# Patient Record
Sex: Female | Born: 1940 | Race: White | Hispanic: No | Marital: Married | Smoking: Never smoker
Health system: Southern US, Community
[De-identification: ages and names within clinical notes are randomized; demographics above are authoritative.]

## PROBLEM LIST (undated history)

## (undated) DIAGNOSIS — J449 Chronic obstructive pulmonary disease, unspecified: Secondary | ICD-10-CM

## (undated) DIAGNOSIS — G4700 Insomnia, unspecified: Secondary | ICD-10-CM

## (undated) DIAGNOSIS — K219 Gastro-esophageal reflux disease without esophagitis: Secondary | ICD-10-CM

## (undated) DIAGNOSIS — G43909 Migraine, unspecified, not intractable, without status migrainosus: Secondary | ICD-10-CM

## (undated) DIAGNOSIS — M419 Scoliosis, unspecified: Secondary | ICD-10-CM

## (undated) DIAGNOSIS — R5382 Chronic fatigue, unspecified: Secondary | ICD-10-CM

## (undated) DIAGNOSIS — M797 Fibromyalgia: Secondary | ICD-10-CM

## (undated) DIAGNOSIS — M1990 Unspecified osteoarthritis, unspecified site: Secondary | ICD-10-CM

## (undated) DIAGNOSIS — M48061 Spinal stenosis, lumbar region without neurogenic claudication: Secondary | ICD-10-CM

## (undated) DIAGNOSIS — M810 Age-related osteoporosis without current pathological fracture: Secondary | ICD-10-CM

## (undated) DIAGNOSIS — J301 Allergic rhinitis due to pollen: Secondary | ICD-10-CM

## (undated) DIAGNOSIS — R030 Elevated blood-pressure reading, without diagnosis of hypertension: Secondary | ICD-10-CM

## (undated) DIAGNOSIS — M5416 Radiculopathy, lumbar region: Secondary | ICD-10-CM

## (undated) DIAGNOSIS — IMO0002 Reserved for concepts with insufficient information to code with codable children: Secondary | ICD-10-CM

## (undated) DIAGNOSIS — E039 Hypothyroidism, unspecified: Secondary | ICD-10-CM

## (undated) DIAGNOSIS — M533 Sacrococcygeal disorders, not elsewhere classified: Secondary | ICD-10-CM

## (undated) HISTORY — PX: RECTOCELE REPAIR: SHX761

## (undated) HISTORY — DX: Radiculopathy, lumbar region: M54.16

## (undated) HISTORY — DX: Reserved for concepts with insufficient information to code with codable children: IMO0002

## (undated) HISTORY — DX: Allergic rhinitis due to pollen: J30.1

## (undated) HISTORY — DX: Elevated blood-pressure reading, without diagnosis of hypertension: R03.0

## (undated) HISTORY — DX: Age-related osteoporosis without current pathological fracture: M81.0

## (undated) HISTORY — DX: Radiculopathy, lumbar region: M48.061

## (undated) HISTORY — DX: Hypothyroidism, unspecified: E03.9

## (undated) HISTORY — DX: Scoliosis, unspecified: M41.9

## (undated) HISTORY — PX: CATARACT EXTRACTION, BILATERAL: SHX1313

## (undated) HISTORY — DX: Chronic fatigue, unspecified: R53.82

## (undated) HISTORY — DX: Gastro-esophageal reflux disease without esophagitis: K21.9

## (undated) HISTORY — DX: Fibromyalgia: M79.7

## (undated) HISTORY — DX: Insomnia, unspecified: G47.00

## (undated) HISTORY — DX: Migraine, unspecified, not intractable, without status migrainosus: G43.909

## (undated) HISTORY — PX: OTHER SURGICAL HISTORY: SHX169

## (undated) HISTORY — PX: TOTAL KNEE ARTHROPLASTY: SHX125

## (undated) HISTORY — PX: ESOPHAGOGASTRODUODENOSCOPY ENDOSCOPY: SHX5814

## (undated) HISTORY — DX: Sacrococcygeal disorders, not elsewhere classified: M53.3

## (undated) HISTORY — DX: Unspecified osteoarthritis, unspecified site: M19.90

## (undated) HISTORY — DX: Chronic obstructive pulmonary disease, unspecified: J44.9

---

## 1981-05-17 HISTORY — PX: BLADDER SUSPENSION: SHX72

## 1981-05-17 HISTORY — PX: VAGINAL HYSTERECTOMY: SUR661

## 1998-05-21 ENCOUNTER — Ambulatory Visit (HOSPITAL_COMMUNITY): Admission: RE | Admit: 1998-05-21 | Discharge: 1998-05-21 | Payer: Self-pay | Admitting: Plastic Surgery

## 1999-08-24 ENCOUNTER — Other Ambulatory Visit: Admission: RE | Admit: 1999-08-24 | Discharge: 1999-08-24 | Payer: Self-pay | Admitting: Internal Medicine

## 2000-02-07 ENCOUNTER — Encounter: Payer: Self-pay | Admitting: Neurosurgery

## 2000-02-07 ENCOUNTER — Ambulatory Visit (HOSPITAL_COMMUNITY): Admission: RE | Admit: 2000-02-07 | Discharge: 2000-02-07 | Payer: Self-pay | Admitting: Neurosurgery

## 2000-02-28 ENCOUNTER — Ambulatory Visit (HOSPITAL_COMMUNITY): Admission: RE | Admit: 2000-02-28 | Discharge: 2000-02-28 | Payer: Self-pay | Admitting: Internal Medicine

## 2000-02-28 ENCOUNTER — Encounter: Payer: Self-pay | Admitting: Internal Medicine

## 2000-05-12 ENCOUNTER — Encounter: Payer: Self-pay | Admitting: Internal Medicine

## 2000-07-09 ENCOUNTER — Encounter: Payer: Self-pay | Admitting: Internal Medicine

## 2001-11-11 ENCOUNTER — Encounter: Payer: Self-pay | Admitting: Neurosurgery

## 2001-11-11 ENCOUNTER — Ambulatory Visit (HOSPITAL_COMMUNITY): Admission: RE | Admit: 2001-11-11 | Discharge: 2001-11-11 | Payer: Self-pay | Admitting: Neurosurgery

## 2002-10-22 ENCOUNTER — Encounter: Payer: Self-pay | Admitting: Orthopaedic Surgery

## 2002-10-22 ENCOUNTER — Encounter: Admission: RE | Admit: 2002-10-22 | Discharge: 2002-10-22 | Payer: Self-pay | Admitting: Orthopaedic Surgery

## 2002-10-22 ENCOUNTER — Encounter: Payer: Self-pay | Admitting: Radiology

## 2002-11-30 ENCOUNTER — Encounter: Admission: RE | Admit: 2002-11-30 | Discharge: 2002-11-30 | Payer: Self-pay | Admitting: Orthopedic Surgery

## 2002-11-30 ENCOUNTER — Encounter: Payer: Self-pay | Admitting: Orthopedic Surgery

## 2002-12-17 ENCOUNTER — Encounter: Payer: Self-pay | Admitting: Orthopedic Surgery

## 2002-12-17 ENCOUNTER — Encounter: Admission: RE | Admit: 2002-12-17 | Discharge: 2002-12-17 | Payer: Self-pay | Admitting: Orthopedic Surgery

## 2004-01-27 HISTORY — PX: COLONOSCOPY: SHX174

## 2004-04-21 ENCOUNTER — Ambulatory Visit: Payer: Self-pay | Admitting: Internal Medicine

## 2004-04-24 ENCOUNTER — Ambulatory Visit: Payer: Self-pay | Admitting: Internal Medicine

## 2004-06-15 ENCOUNTER — Ambulatory Visit: Payer: Self-pay | Admitting: Internal Medicine

## 2004-06-30 ENCOUNTER — Ambulatory Visit: Payer: Self-pay | Admitting: Internal Medicine

## 2004-08-25 ENCOUNTER — Ambulatory Visit: Payer: Self-pay | Admitting: Internal Medicine

## 2004-09-22 ENCOUNTER — Ambulatory Visit: Payer: Self-pay | Admitting: Internal Medicine

## 2004-11-24 ENCOUNTER — Ambulatory Visit: Payer: Self-pay | Admitting: Internal Medicine

## 2004-12-08 ENCOUNTER — Ambulatory Visit: Payer: Self-pay | Admitting: Internal Medicine

## 2005-02-15 ENCOUNTER — Ambulatory Visit: Payer: Self-pay | Admitting: Internal Medicine

## 2005-04-22 ENCOUNTER — Ambulatory Visit: Payer: Self-pay | Admitting: Internal Medicine

## 2005-06-29 ENCOUNTER — Ambulatory Visit: Payer: Self-pay | Admitting: Internal Medicine

## 2005-08-17 ENCOUNTER — Ambulatory Visit: Payer: Self-pay | Admitting: Internal Medicine

## 2005-10-19 ENCOUNTER — Ambulatory Visit: Payer: Self-pay | Admitting: Internal Medicine

## 2005-12-14 ENCOUNTER — Ambulatory Visit: Payer: Self-pay | Admitting: Internal Medicine

## 2006-01-24 ENCOUNTER — Ambulatory Visit: Payer: Self-pay | Admitting: Internal Medicine

## 2006-03-29 ENCOUNTER — Ambulatory Visit: Payer: Self-pay | Admitting: Internal Medicine

## 2006-06-28 ENCOUNTER — Ambulatory Visit: Payer: Self-pay | Admitting: Internal Medicine

## 2006-08-29 ENCOUNTER — Ambulatory Visit: Payer: Self-pay | Admitting: Internal Medicine

## 2006-09-05 ENCOUNTER — Encounter: Admission: RE | Admit: 2006-09-05 | Discharge: 2006-09-05 | Payer: Self-pay | Admitting: Neurosurgery

## 2006-11-29 ENCOUNTER — Ambulatory Visit: Payer: Self-pay | Admitting: Internal Medicine

## 2006-11-30 DIAGNOSIS — J301 Allergic rhinitis due to pollen: Secondary | ICD-10-CM

## 2006-11-30 DIAGNOSIS — B372 Candidiasis of skin and nail: Secondary | ICD-10-CM

## 2006-11-30 DIAGNOSIS — M797 Fibromyalgia: Secondary | ICD-10-CM

## 2006-11-30 DIAGNOSIS — E039 Hypothyroidism, unspecified: Secondary | ICD-10-CM

## 2006-11-30 DIAGNOSIS — K219 Gastro-esophageal reflux disease without esophagitis: Secondary | ICD-10-CM | POA: Insufficient documentation

## 2006-11-30 HISTORY — DX: Allergic rhinitis due to pollen: J30.1

## 2007-02-03 ENCOUNTER — Ambulatory Visit: Payer: Self-pay | Admitting: Internal Medicine

## 2007-02-07 ENCOUNTER — Telehealth (INDEPENDENT_AMBULATORY_CARE_PROVIDER_SITE_OTHER): Payer: Self-pay | Admitting: *Deleted

## 2007-02-16 ENCOUNTER — Telehealth: Payer: Self-pay | Admitting: *Deleted

## 2007-02-16 DIAGNOSIS — M159 Polyosteoarthritis, unspecified: Secondary | ICD-10-CM | POA: Insufficient documentation

## 2007-02-28 ENCOUNTER — Inpatient Hospital Stay (HOSPITAL_COMMUNITY): Admission: RE | Admit: 2007-02-28 | Discharge: 2007-03-02 | Payer: Self-pay | Admitting: Orthopedic Surgery

## 2007-03-24 ENCOUNTER — Ambulatory Visit: Payer: Self-pay | Admitting: Internal Medicine

## 2007-03-24 DIAGNOSIS — L723 Sebaceous cyst: Secondary | ICD-10-CM | POA: Insufficient documentation

## 2007-04-28 ENCOUNTER — Ambulatory Visit: Payer: Self-pay | Admitting: Internal Medicine

## 2007-04-28 DIAGNOSIS — E539 Vitamin B deficiency, unspecified: Secondary | ICD-10-CM | POA: Insufficient documentation

## 2007-05-02 ENCOUNTER — Telehealth: Payer: Self-pay | Admitting: Internal Medicine

## 2007-05-18 HISTORY — PX: CHOLECYSTECTOMY: SHX55

## 2007-05-29 ENCOUNTER — Telehealth: Payer: Self-pay | Admitting: Internal Medicine

## 2007-05-30 ENCOUNTER — Telehealth: Payer: Self-pay | Admitting: Internal Medicine

## 2007-06-27 ENCOUNTER — Telehealth: Payer: Self-pay | Admitting: Internal Medicine

## 2007-07-14 ENCOUNTER — Ambulatory Visit: Payer: Self-pay | Admitting: Internal Medicine

## 2007-07-14 LAB — CONVERTED CEMR LAB: Vit D, 1,25-Dihydroxy: 63 (ref 30–89)

## 2007-07-25 ENCOUNTER — Encounter: Payer: Self-pay | Admitting: Internal Medicine

## 2007-08-03 ENCOUNTER — Encounter: Payer: Self-pay | Admitting: Internal Medicine

## 2007-08-28 ENCOUNTER — Encounter: Payer: Self-pay | Admitting: Internal Medicine

## 2007-08-30 ENCOUNTER — Telehealth: Payer: Self-pay | Admitting: *Deleted

## 2007-10-06 ENCOUNTER — Ambulatory Visit (HOSPITAL_BASED_OUTPATIENT_CLINIC_OR_DEPARTMENT_OTHER): Admission: RE | Admit: 2007-10-06 | Discharge: 2007-10-06 | Payer: Self-pay | Admitting: Urology

## 2007-10-12 ENCOUNTER — Ambulatory Visit: Payer: Self-pay | Admitting: Internal Medicine

## 2007-10-23 ENCOUNTER — Encounter: Payer: Self-pay | Admitting: Internal Medicine

## 2007-11-16 ENCOUNTER — Telehealth: Payer: Self-pay | Admitting: Internal Medicine

## 2007-12-12 ENCOUNTER — Ambulatory Visit: Payer: Self-pay | Admitting: Internal Medicine

## 2007-12-12 DIAGNOSIS — R350 Frequency of micturition: Secondary | ICD-10-CM

## 2007-12-12 DIAGNOSIS — R609 Edema, unspecified: Secondary | ICD-10-CM

## 2007-12-12 LAB — CONVERTED CEMR LAB
Chloride: 103 meq/L (ref 96–112)
Folate: >20 ng/mL
Free T4: 0.7 ng/dL (ref 0.6–1.6)
GFR calc Af Amer: 107 mL/min
GFR calc non Af Amer: 89 mL/min
Potassium: 3.7 meq/L (ref 3.5–5.1)
TSH: 1.18 microintl units/mL (ref 0.35–5.50)
Vitamin B-12: 623 pg/mL (ref 211–911)

## 2007-12-20 ENCOUNTER — Encounter: Payer: Self-pay | Admitting: Internal Medicine

## 2008-02-07 ENCOUNTER — Telehealth: Payer: Self-pay | Admitting: Internal Medicine

## 2008-02-07 ENCOUNTER — Ambulatory Visit: Payer: Self-pay | Admitting: Internal Medicine

## 2008-02-07 ENCOUNTER — Encounter: Admission: RE | Admit: 2008-02-07 | Discharge: 2008-02-07 | Payer: Self-pay | Admitting: Internal Medicine

## 2008-02-07 LAB — CONVERTED CEMR LAB
Alkaline Phosphatase: 64 units/L (ref 39–117)
Bilirubin, Direct: 0.1 mg/dL (ref 0.0–0.3)
Eosinophils Absolute: 0.3 10*3/uL (ref 0.0–0.7)
Eosinophils Relative: 6.5 % — ABNORMAL HIGH (ref 0.0–5.0)
HCT: 34.8 % — ABNORMAL LOW (ref 36.0–46.0)
MCV: 95.6 fL (ref 78.0–100.0)
Monocytes Absolute: 0.4 10*3/uL (ref 0.1–1.0)
Neutrophils Relative %: 43.8 % (ref 43.0–77.0)
Platelets: 223 10*3/uL (ref 150–400)
RDW: 12.8 % (ref 11.5–14.6)
Total Bilirubin: 0.5 mg/dL (ref 0.3–1.2)
Total Protein: 5.9 g/dL — ABNORMAL LOW (ref 6.0–8.3)
WBC: 4.7 10*3/uL (ref 4.5–10.5)

## 2008-02-13 ENCOUNTER — Ambulatory Visit (HOSPITAL_COMMUNITY): Admission: RE | Admit: 2008-02-13 | Discharge: 2008-02-13 | Payer: Self-pay | Admitting: Internal Medicine

## 2008-02-15 ENCOUNTER — Ambulatory Visit: Payer: Self-pay | Admitting: Internal Medicine

## 2008-02-20 ENCOUNTER — Telehealth: Payer: Self-pay | Admitting: Internal Medicine

## 2008-03-01 ENCOUNTER — Encounter: Payer: Self-pay | Admitting: Internal Medicine

## 2008-04-02 ENCOUNTER — Ambulatory Visit (HOSPITAL_COMMUNITY): Admission: RE | Admit: 2008-04-02 | Discharge: 2008-04-02 | Payer: Self-pay | Admitting: General Surgery

## 2008-04-02 ENCOUNTER — Encounter (INDEPENDENT_AMBULATORY_CARE_PROVIDER_SITE_OTHER): Payer: Self-pay | Admitting: General Surgery

## 2008-04-02 ENCOUNTER — Encounter: Payer: Self-pay | Admitting: Internal Medicine

## 2008-04-16 ENCOUNTER — Encounter: Payer: Self-pay | Admitting: Internal Medicine

## 2008-04-17 ENCOUNTER — Ambulatory Visit: Payer: Self-pay | Admitting: Internal Medicine

## 2008-04-17 DIAGNOSIS — R51 Headache: Secondary | ICD-10-CM

## 2008-04-17 DIAGNOSIS — R32 Unspecified urinary incontinence: Secondary | ICD-10-CM

## 2008-04-17 DIAGNOSIS — K5909 Other constipation: Secondary | ICD-10-CM

## 2008-04-17 DIAGNOSIS — R519 Headache, unspecified: Secondary | ICD-10-CM | POA: Insufficient documentation

## 2008-04-17 LAB — CONVERTED CEMR LAB
Basophils Absolute: 0 10*3/uL (ref 0.0–0.1)
Basophils Relative: 0 % (ref 0.0–3.0)
Eosinophils Absolute: 1 10*3/uL — ABNORMAL HIGH (ref 0.0–0.7)
HCT: 35 % — ABNORMAL LOW (ref 36.0–46.0)
Hemoglobin: 12 g/dL (ref 12.0–15.0)
Lymphocytes Relative: 34.2 % (ref 12.0–46.0)
MCHC: 34.2 g/dL (ref 30.0–36.0)
MCV: 98.4 fL (ref 78.0–100.0)
Monocytes Absolute: 0.4 10*3/uL (ref 0.1–1.0)
Neutro Abs: 2 10*3/uL (ref 1.4–7.7)
RBC: 3.56 M/uL — ABNORMAL LOW (ref 3.87–5.11)
RDW: 13.3 % (ref 11.5–14.6)
Vitamin B-12: 1219 pg/mL — ABNORMAL HIGH (ref 211–911)

## 2008-04-24 ENCOUNTER — Telehealth: Payer: Self-pay | Admitting: Internal Medicine

## 2008-06-17 ENCOUNTER — Ambulatory Visit: Payer: Self-pay | Admitting: Internal Medicine

## 2008-06-17 DIAGNOSIS — G909 Disorder of the autonomic nervous system, unspecified: Secondary | ICD-10-CM | POA: Insufficient documentation

## 2008-06-17 DIAGNOSIS — G990 Autonomic neuropathy in diseases classified elsewhere: Secondary | ICD-10-CM | POA: Insufficient documentation

## 2008-06-17 LAB — CONVERTED CEMR LAB: Folate: >20 ng/mL

## 2008-06-26 ENCOUNTER — Telehealth: Payer: Self-pay | Admitting: Internal Medicine

## 2008-06-28 ENCOUNTER — Telehealth (INDEPENDENT_AMBULATORY_CARE_PROVIDER_SITE_OTHER): Payer: Self-pay | Admitting: *Deleted

## 2008-07-15 ENCOUNTER — Ambulatory Visit: Payer: Self-pay | Admitting: Internal Medicine

## 2008-08-12 ENCOUNTER — Telehealth: Payer: Self-pay | Admitting: Internal Medicine

## 2008-08-23 ENCOUNTER — Ambulatory Visit: Payer: Self-pay | Admitting: Internal Medicine

## 2008-08-23 DIAGNOSIS — J329 Chronic sinusitis, unspecified: Secondary | ICD-10-CM | POA: Insufficient documentation

## 2008-08-26 ENCOUNTER — Encounter: Payer: Self-pay | Admitting: Internal Medicine

## 2008-08-26 ENCOUNTER — Ambulatory Visit: Payer: Self-pay | Admitting: Internal Medicine

## 2008-08-27 ENCOUNTER — Encounter: Payer: Self-pay | Admitting: Internal Medicine

## 2008-09-02 ENCOUNTER — Telehealth: Payer: Self-pay | Admitting: Internal Medicine

## 2008-09-06 ENCOUNTER — Encounter: Admission: RE | Admit: 2008-09-06 | Discharge: 2008-09-06 | Payer: Self-pay | Admitting: Orthopedic Surgery

## 2008-09-16 ENCOUNTER — Ambulatory Visit: Payer: Self-pay | Admitting: Internal Medicine

## 2008-09-17 ENCOUNTER — Telehealth: Payer: Self-pay | Admitting: Internal Medicine

## 2008-09-17 LAB — CONVERTED CEMR LAB
Free T4: 1.3 ng/dL (ref 0.6–1.6)
T3, Free: 2.6 pg/mL (ref 2.3–4.2)

## 2008-10-21 ENCOUNTER — Telehealth: Payer: Self-pay | Admitting: Internal Medicine

## 2008-10-23 ENCOUNTER — Encounter: Admission: RE | Admit: 2008-10-23 | Discharge: 2008-10-23 | Payer: Self-pay | Admitting: Orthopedic Surgery

## 2008-10-31 ENCOUNTER — Telehealth: Payer: Self-pay | Admitting: Internal Medicine

## 2008-11-06 ENCOUNTER — Encounter: Admission: RE | Admit: 2008-11-06 | Discharge: 2008-11-06 | Payer: Self-pay | Admitting: Orthopedic Surgery

## 2008-12-04 ENCOUNTER — Ambulatory Visit: Payer: Self-pay | Admitting: Internal Medicine

## 2008-12-04 DIAGNOSIS — M810 Age-related osteoporosis without current pathological fracture: Secondary | ICD-10-CM | POA: Insufficient documentation

## 2008-12-06 ENCOUNTER — Telehealth: Payer: Self-pay | Admitting: Internal Medicine

## 2008-12-12 ENCOUNTER — Ambulatory Visit: Payer: Self-pay | Admitting: Internal Medicine

## 2008-12-12 ENCOUNTER — Encounter: Payer: Self-pay | Admitting: Internal Medicine

## 2008-12-13 ENCOUNTER — Telehealth: Payer: Self-pay | Admitting: Internal Medicine

## 2008-12-17 ENCOUNTER — Telehealth: Payer: Self-pay | Admitting: *Deleted

## 2008-12-19 ENCOUNTER — Encounter: Payer: Self-pay | Admitting: Internal Medicine

## 2008-12-30 ENCOUNTER — Telehealth (INDEPENDENT_AMBULATORY_CARE_PROVIDER_SITE_OTHER): Payer: Self-pay | Admitting: *Deleted

## 2009-01-14 LAB — CONVERTED CEMR LAB: Pap Smear: NORMAL

## 2009-02-14 ENCOUNTER — Telehealth: Payer: Self-pay | Admitting: Internal Medicine

## 2009-02-18 ENCOUNTER — Encounter: Payer: Self-pay | Admitting: Internal Medicine

## 2009-03-04 ENCOUNTER — Ambulatory Visit: Payer: Self-pay | Admitting: Internal Medicine

## 2009-03-04 LAB — CONVERTED CEMR LAB
CO2: 32 meq/L (ref 19–32)
Chloride: 101 meq/L (ref 96–112)
Creatinine, Ser: 0.7 mg/dL (ref 0.4–1.2)
Free T4: 0.9 ng/dL (ref 0.6–1.6)
Potassium: 3.8 meq/L (ref 3.5–5.1)
Sodium: 144 meq/L (ref 135–145)
T3, Free: 2.7 pg/mL (ref 2.3–4.2)
TSH: 0.39 microintl units/mL (ref 0.35–5.50)

## 2009-03-11 ENCOUNTER — Telehealth: Payer: Self-pay | Admitting: Internal Medicine

## 2009-05-17 HISTORY — PX: ROTATOR CUFF REPAIR: SHX139

## 2009-05-26 ENCOUNTER — Ambulatory Visit: Payer: Self-pay | Admitting: Internal Medicine

## 2009-05-30 ENCOUNTER — Encounter: Payer: Self-pay | Admitting: Internal Medicine

## 2009-06-03 ENCOUNTER — Inpatient Hospital Stay (HOSPITAL_COMMUNITY): Admission: RE | Admit: 2009-06-03 | Discharge: 2009-06-05 | Payer: Self-pay | Admitting: Orthopedic Surgery

## 2009-06-10 ENCOUNTER — Ambulatory Visit (HOSPITAL_COMMUNITY)
Admission: RE | Admit: 2009-06-10 | Discharge: 2009-06-10 | Payer: Self-pay | Source: Home / Self Care | Admitting: Orthopedic Surgery

## 2009-06-10 ENCOUNTER — Ambulatory Visit: Payer: Self-pay | Admitting: Vascular Surgery

## 2009-08-27 ENCOUNTER — Ambulatory Visit: Payer: Self-pay | Admitting: Internal Medicine

## 2009-08-27 LAB — CONVERTED CEMR LAB
BUN: 21 mg/dL (ref 6–23)
Basophils Relative: 0.5 % (ref 0.0–3.0)
Bilirubin Urine: NEGATIVE
CO2: 29 meq/L (ref 19–32)
Chloride: 103 meq/L (ref 96–112)
Creatinine, Ser: 0.6 mg/dL (ref 0.4–1.2)
Free T4: 0.9 ng/dL (ref 0.6–1.6)
Glucose, Bld: 102 mg/dL — ABNORMAL HIGH (ref 70–99)
Glucose, Urine, Semiquant: NEGATIVE
HCT: 38.8 % (ref 36.0–46.0)
Hemoglobin: 13.6 g/dL (ref 12.0–15.0)
Lymphocytes Relative: 18.1 % (ref 12.0–46.0)
Lymphs Abs: 1 10*3/uL (ref 0.7–4.0)
Monocytes Relative: 7.5 % (ref 3.0–12.0)
Neutro Abs: 4.2 10*3/uL (ref 1.4–7.7)
RBC: 3.96 M/uL (ref 3.87–5.11)
T3, Free: 2.4 pg/mL (ref 2.3–4.2)
pH: 5

## 2009-09-10 ENCOUNTER — Telehealth: Payer: Self-pay | Admitting: Internal Medicine

## 2009-09-18 ENCOUNTER — Telehealth: Payer: Self-pay | Admitting: Internal Medicine

## 2009-10-08 ENCOUNTER — Telehealth: Payer: Self-pay | Admitting: Internal Medicine

## 2009-10-15 ENCOUNTER — Ambulatory Visit: Payer: Self-pay | Admitting: Internal Medicine

## 2009-10-15 ENCOUNTER — Telehealth: Payer: Self-pay | Admitting: Internal Medicine

## 2009-10-16 ENCOUNTER — Encounter: Payer: Self-pay | Admitting: Internal Medicine

## 2009-10-16 ENCOUNTER — Telehealth: Payer: Self-pay | Admitting: Internal Medicine

## 2009-10-17 ENCOUNTER — Ambulatory Visit: Payer: Self-pay | Admitting: Internal Medicine

## 2009-10-21 ENCOUNTER — Ambulatory Visit: Payer: Self-pay | Admitting: Internal Medicine

## 2009-10-24 ENCOUNTER — Ambulatory Visit: Payer: Self-pay | Admitting: Internal Medicine

## 2009-10-28 ENCOUNTER — Ambulatory Visit: Payer: Self-pay | Admitting: Internal Medicine

## 2009-10-30 ENCOUNTER — Telehealth (INDEPENDENT_AMBULATORY_CARE_PROVIDER_SITE_OTHER): Payer: Self-pay | Admitting: *Deleted

## 2009-10-31 ENCOUNTER — Ambulatory Visit: Payer: Self-pay | Admitting: Internal Medicine

## 2009-11-04 ENCOUNTER — Ambulatory Visit: Payer: Self-pay | Admitting: Internal Medicine

## 2009-11-07 ENCOUNTER — Ambulatory Visit: Payer: Self-pay | Admitting: Internal Medicine

## 2009-11-07 ENCOUNTER — Telehealth (INDEPENDENT_AMBULATORY_CARE_PROVIDER_SITE_OTHER): Payer: Self-pay | Admitting: *Deleted

## 2009-11-11 ENCOUNTER — Ambulatory Visit: Payer: Self-pay | Admitting: Internal Medicine

## 2009-11-11 ENCOUNTER — Encounter: Payer: Self-pay | Admitting: Internal Medicine

## 2009-11-24 ENCOUNTER — Ambulatory Visit: Payer: Self-pay | Admitting: Internal Medicine

## 2009-11-25 ENCOUNTER — Ambulatory Visit: Payer: Self-pay | Admitting: Internal Medicine

## 2009-12-15 ENCOUNTER — Ambulatory Visit: Payer: Self-pay | Admitting: Internal Medicine

## 2009-12-23 ENCOUNTER — Ambulatory Visit: Payer: Self-pay | Admitting: Internal Medicine

## 2010-01-22 ENCOUNTER — Ambulatory Visit: Payer: Self-pay | Admitting: Family Medicine

## 2010-01-23 ENCOUNTER — Telehealth (INDEPENDENT_AMBULATORY_CARE_PROVIDER_SITE_OTHER): Payer: Self-pay | Admitting: *Deleted

## 2010-02-12 ENCOUNTER — Ambulatory Visit: Payer: Self-pay | Admitting: Internal Medicine

## 2010-02-12 LAB — CONVERTED CEMR LAB
BUN: 21 mg/dL (ref 6–23)
Bilirubin Urine: NEGATIVE
Chloride: 108 meq/L (ref 96–112)
Cholesterol: 268 mg/dL — ABNORMAL HIGH (ref 0–200)
Creatinine, Ser: 0.6 mg/dL (ref 0.4–1.2)
Direct LDL: 148.7 mg/dL
HDL: 103 mg/dL (ref >39.00)
Iron: 121 ug/dL (ref 42–145)
Ketones, urine, test strip: NEGATIVE
Protein, U semiquant: NEGATIVE
Triglycerides: 126 mg/dL (ref 0.0–149.0)
Urobilinogen, UA: 0.2
VLDL: 25.2 mg/dL (ref 0.0–40.0)

## 2010-03-16 ENCOUNTER — Ambulatory Visit: Payer: Self-pay | Admitting: Internal Medicine

## 2010-03-24 ENCOUNTER — Ambulatory Visit: Payer: Self-pay | Admitting: Internal Medicine

## 2010-04-27 ENCOUNTER — Encounter (INDEPENDENT_AMBULATORY_CARE_PROVIDER_SITE_OTHER): Payer: Self-pay | Admitting: *Deleted

## 2010-05-12 ENCOUNTER — Ambulatory Visit: Payer: Self-pay | Admitting: Internal Medicine

## 2010-05-27 ENCOUNTER — Ambulatory Visit: Admit: 2010-05-27 | Payer: Self-pay | Admitting: Internal Medicine

## 2010-06-08 ENCOUNTER — Ambulatory Visit
Admission: RE | Admit: 2010-06-08 | Discharge: 2010-06-08 | Payer: Self-pay | Source: Home / Self Care | Attending: Internal Medicine | Admitting: Internal Medicine

## 2010-06-08 DIAGNOSIS — M5412 Radiculopathy, cervical region: Secondary | ICD-10-CM | POA: Insufficient documentation

## 2010-06-16 NOTE — Assessment & Plan Note (Signed)
Summary: allergy test/klw   Vital Signs:  Patient profile:   70 year old female Height:      63 inches Weight:      146 pounds BMI:     25.96 O2 Sat:      96 % on Room air Pulse rate:   86 / minute BP sitting:   114 / 80  (left arm) Cuff size:   regular  Vitals Entered By: Randell Loop CMA (October 15, 2009 3:49 PM)  O2 Sat at Rest %:  96 O2 Flow:  Room air Is Patient Diabetic? No Pain Assessment Patient in pain? no      Comments meds updated today with pt--no antihistamine since 10-11-2009   Copy to:  Darryll Capers Primary Provider/Referring Provider:  Darryll Capers   History of Present Illness: History of Present Illness: 08/23/08- Allergy consult courtesy of Dr Lovell Sheehan for this 67YOF. She had systemic complaints in the 1980's, dx'd as "allergy" by an alternative practioner who used sublingual testing. She continued that approach until she moved from [place] to [place], where she was skin tested  and treated with allergy vaccine. She says that approach also helped. she came to this area 20 yrs ago. After a few years here she was skin tested and placed on allergy vaccine by Dr Suwanee Callas. that seemed to help her for several years, and during that time she was giving her own allergy vaccine, saying it was too far to drive to office. She dropped off vaccine for 6-7 years and gradually feels worse again. Complaints include periorbital dark shiners and edema, stuffy head, sneezing, itching, nasal congestion with snoring that bothers her husband. Never had asthma. Palate itches. Nasal discharge clear, yellow or little blood. Nasonex helps rhinorhea. Astelin increased congestion. Uses AFrin. Triggers said to include strong odors but not seasonal pollens, foods, insect stings or cosmetics, contrast dye or aspirin. Latex causes contact rash. labs: 06/17/08- IgE total low- 1.6.. Immunocap/RAST negative panel. CBC- EOS 18.9%.  October 15, 2009- Rhinitis Return delayed due to several surgeries since  last here. No respiratory complication. This Spring had "difficult" nasal congestion - perennial but worse in Spring. Denies sneeze, itch, drainage. Worse if outdoors much. Not sensitive to temperature changes.  Daily Afrin once. Using nasonex. Denies benefit from anthistamines.  She recalls doing well after allergy vaccine from Dr Waldron Callas 10 years ago,  but flared as per initial HPI. IgE 6.7 Allergy IgE specific panel -not elevated CBC-EOS 3.1% ST- modest reactions, but numerous, to grass, trees, dust mite, mold.     Preventive Screening-Counseling & Management  Alcohol-Tobacco     Smoking Status: never  Medications Prior to Update: 1)  Levothroid 150 Mcg Tabs (Levothyroxine Sodium) .Marland Kitchen.. 1 Once Daily 2)  Prednisone 1 Mg  Tabs (Prednisone) .... 7 A Day 3)  Piroxicam 20 Mg Caps (Piroxicam) .... Once Daily 4)  Hydrocodone-Acetaminophen 10-500 Mg Tabs (Hydrocodone-Acetaminophen) .... One By Mouth Two Times A Day As Needed (  This Overrides Prior Rx) 5)  Vitamin D 16109 Unit  Caps (Ergocalciferol) .... One By Mouth Weekly 6)  Cyanocobalamin 1000 Mcg/ml Inj Soln (Cyanocobalamin) .Marland Kitchen.. 1 Ml Q 6 Weeks 7)  Ambien 10 Mg  Tabs (Zolpidem Tartrate) .... Before Bedtime As Needed 8)  Ocuvite Adult Formula   Caps (Multiple Vitamins-Minerals) .... Once Daily 9)  Correctol 5 Mg  Tbec (Bisacodyl) .Marland Kitchen.. 1 At Bedtime 10)  Anacin 400-32 Mg  Tabs (Aspirin-Caffeine) .... As Needed 11)  Folic Acid 1 Mg  Tabs (Folic Acid) .... 1/2 Once Daily 12)  Clotrimazole-Betamethasone 1-0.05 % Lotn (Clotrimazole-Betamethasone) .... 2-4 Drops in Ear Two Times A Day As Needed 13)  Multivitamins  Caps (Multiple Vitamin) .Marland Kitchen.. 1 Once Daily 14)  Sumatriptan Succinate 50 Mg Tabs (Sumatriptan Succinate) .... One By Mouth As Needed Head Ache 15)  Nasonex 50 Mcg/act Susp (Mometasone Furoate) .... 2 Sprays Each Nostril Once Daily 16)  Provigil 200 Mg Tabs (Modafinil) .Marland Kitchen.. 1 Once Daily 17)  Ciprofloxacin Hcl 250 Mg Tabs (Ciprofloxacin  Hcl) .... One By Mouth Two Times A Day For 7 Dyas 18)  Ciprofloxacin Hcl 250 Mg Tabs (Ciprofloxacin Hcl) .... One By Mouth Two Times A Day X 5 Days  Current Medications (verified): 1)  Levothroid 150 Mcg Tabs (Levothyroxine Sodium) .Marland Kitchen.. 1 Once Daily 2)  Prednisone 1 Mg  Tabs (Prednisone) .... 5 Tablets By Mouth  A Day 3)  Piroxicam 20 Mg Caps (Piroxicam) .... Once Daily 4)  Hydrocodone-Acetaminophen 10-500 Mg Tabs (Hydrocodone-Acetaminophen) .... One By Mouth Two Times A Day As Needed (  This Overrides Prior Rx) 5)  Vitamin D 62130 Unit  Caps (Ergocalciferol) .... One By Mouth Weekly 6)  Cyanocobalamin 1000 Mcg/ml Inj Soln (Cyanocobalamin) .Marland Kitchen.. 1 Ml Q 6 Weeks 7)  Ambien 10 Mg  Tabs (Zolpidem Tartrate) .... Before Bedtime As Needed 8)  Ocuvite Adult Formula   Caps (Multiple Vitamins-Minerals) .... Once Daily 9)  Correctol 5 Mg  Tbec (Bisacodyl) .Marland Kitchen.. 1 At Bedtime 10)  Anacin 400-32 Mg  Tabs (Aspirin-Caffeine) .... As Needed 11)  Folic Acid 1 Mg  Tabs (Folic Acid) .... 1/2 Once Daily 12)  Clotrimazole-Betamethasone 1-0.05 % Lotn (Clotrimazole-Betamethasone) .... 2-4 Drops in Ear Two Times A Day As Needed 13)  Multivitamins  Caps (Multiple Vitamin) .Marland Kitchen.. 1 Once Daily 14)  Sumatriptan Succinate 50 Mg Tabs (Sumatriptan Succinate) .... One By Mouth As Needed Head Ache 15)  Nasonex 50 Mcg/act Susp (Mometasone Furoate) .... 2 Sprays Each Nostril Once Daily  Allergies: 1)  ! * Lamisil  Comments:  Nurse/Medical Assistant: The patient's medications and allergies were reviewed with the patient and were updated in the Medication and Allergy Lists.  Past History:  Past Medical History: Last updated: 04/17/2008 Allergic rhinitis GERD Hypothyroidism Fibromyalgia Headache Urinary incontinence  Family History: Last updated: 08/23/2008 Family History Other cancer-Colon Fam hx Irritable bowel dementia grandchild died  Social History: Last updated: 08/23/2008 Retired Married- feels life  stress Patient never smoked.   Risk Factors: Exercise: no (04/28/2007)  Risk Factors: Smoking Status: never (10/15/2009) Passive Smoke Exposure: no (08/27/2009)  Past Surgical History: TAH-70yo Colonoscopy-01/27/2004 Total knee replacement-right 2008, Left 2011 Rectocoel Knee arthroscopy Right rotator cuff  Review of Systems      See HPI       The patient complains of nasal congestion/difficulty breathing through nose.  The patient denies shortness of breath with activity, shortness of breath at rest, productive cough, non-productive cough, coughing up blood, chest pain, irregular heartbeats, acid heartburn, indigestion, loss of appetite, weight change, abdominal pain, difficulty swallowing, sore throat, tooth/dental problems, headaches, and sneezing.    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 She wants to restart allergy vaccine Husband home. 45 minute drive. Discussed risk/ policy, protocol, anaphyllaxis, epipen. Consider neti pot. CT sinus was NEG, but chronic sinusitis is not excluded.  Problem # 2:  ALLERGIC RHINITIS (ICD-477.9) Atopic component is likely as discussed above, but not likely the exclusive process. Recall she had 18% peripheral eos at one point. Watch also for fungal sinusitis. Dust control reviewed. Her updated medication list for this problem  includes:    Nasonex 50 Mcg/act Susp (Mometasone furoate) .Marland Kitchen... 2 sprays each nostril once daily  Medications Added to Medication List This Visit: 1)  Prednisone 1 Mg Tabs (Prednisone) .... 5 tablets by mouth  a day  Other Orders: Est. Patient Level III (16109) Allergy Puncture Test (60454) Allergy I.D Test (09811)  Patient Instructions: 1)  Please schedule a follow-up appointment in 2 months. 2)  Consider the dust control measures we discussed. 3)  The allergy lab will call you when allergy vaccine is ready to start.

## 2010-06-16 NOTE — Assessment & Plan Note (Signed)
Summary: COUGH, CONGESTION // RS   Vital Signs:  Patient profile:   70 year old female Weight:      150 pounds Temp:     97.6 degrees F oral BP sitting:   170 / 90  (left arm) Cuff size:   regular  Vitals Entered By: Sid Falcon LPN (January 22, 2010 3:51 PM) CC: cough, congestion X 3 weeks   History of Present Illness: Patient seen with almost 4 week history of cough and chest congestion. Her mother was in a nursing home recently diagnosed with pneumonia had coughed in her face and symptoms started shortly afterwards. Husband with similar symptoms. Cough is productive. Also has some clear nasal discharge. No history of fall allergies. No recent fever. Denies history of smoking. No dyspnea, pleuritic pain, or hemoptysis. Over-the-counter cough medication without much improvement.  Allergies: 1)  ! * Lamisil  Past History:  Past Medical History: Last updated: 04/17/2008 Allergic rhinitis GERD Hypothyroidism Fibromyalgia Headache Urinary incontinence PMH reviewed for relevance  Physical Exam  General:  Well-developed,well-nourished,in no acute distress; alert,appropriate and cooperative throughout examination Head:  Normocephalic and atraumatic without obvious abnormalities. No apparent alopecia or balding. Ears:  External ear exam shows no significant lesions or deformities.  Otoscopic examination reveals clear canals, tympanic membranes are intact bilaterally without bulging, retraction, inflammation or discharge. Hearing is grossly normal bilaterally. Mouth:  Oral mucosa and oropharynx without lesions or exudates.  Teeth in good repair. Neck:  No deformities, masses, or tenderness noted. Lungs:  Normal respiratory effort, chest expands symmetrically. Lungs are clear to auscultation, no crackles or wheezes. Heart:  normal rate and regular rhythm.     Impression & Recommendations:  Problem # 1:  ACUTE BRONCHITIS (ICD-466.0)  Her updated medication list for this  problem includes:    Doxycycline Hyclate 100 Mg Caps (Doxycycline hyclate) ..... One by mouth two times a day for 10 days  Orders: Prescription Created Electronically [phone])  Complete Medication List: 1)  Levothroid 150 Mcg Tabs (Levothyroxine sodium) .Marland Kitchen.. 1 once daily 2)  Prednisone 1 Mg Tabs (Prednisone) .... 5 tablets by mouth  a day 3)  Piroxicam 20 Mg Caps (Piroxicam) .... Once daily 4)  Hydrocodone-acetaminophen 10-500 Mg Tabs (Hydrocodone-acetaminophen) .... One by mouth two times a day as needed (  this overrides prior rx) 5)  Vitamin D 28413 Unit Caps (Ergocalciferol) .... One by mouth weekly 6)  Cyanocobalamin 1000 Mcg/ml Inj Soln (Cyanocobalamin) .Marland Kitchen.. 1 ml q 6 weeks 7)  Ambien 10 Mg Tabs (Zolpidem tartrate) .... Before bedtime as needed 8)  Ocuvite Adult Formula Caps (Multiple vitamins-minerals) .... Once daily 9)  Correctol 5 Mg Tbec (Bisacodyl) .Marland Kitchen.. 1 at bedtime 10)  Anacin 400-32 Mg Tabs (Aspirin-caffeine) .... As needed 11)  Folic Acid 1 Mg Tabs (Folic acid) .... 1/2 once daily 12)  Clotrimazole-betamethasone 1-0.05 % Lotn (Clotrimazole-betamethasone) .... 2-4 drops in ear two times a day as needed 13)  Multivitamins Caps (Multiple vitamin) .Marland Kitchen.. 1 once daily 14)  Sumatriptan Succinate 50 Mg Tabs (Sumatriptan succinate) .... One by mouth as needed head ache 15)  Fluticasone Propionate 50 Mcg/act Susp (Fluticasone propionate) .Marland Kitchen.. 1-2 puffs each nostril once daily      nasal steroid 16)  Allergy Vaccine New Start Go  17)  Epipen 0.3 Mg/0.57ml Devi (Epinephrine) .... For severe allergic reaction 18)  Ipratropium Bromide 0.06 % Soln (Ipratropium bromide) .Marland Kitchen.. 1-2 puffs each nostril three times a day as needed 19)  Doxycycline Hyclate 100 Mg Caps (Doxycycline hyclate) .Marland KitchenMarland KitchenMarland Kitchen  One by mouth two times a day for 10 days  Patient Instructions: 1)  Acute Bronchitis symptoms for less then 10 days are not  helped by antibiotics. Take over the counter cough medications. Call if no  improvement in 5-7 days, sooner if increasing cough, fever, or new symptoms ( shortness of breath, chest pain) .  Prescriptions: DOXYCYCLINE HYCLATE 100 MG CAPS (DOXYCYCLINE HYCLATE) one by mouth two times a day for 10 days  #20 x 0   Entered and Authorized by:   Evelena Peat MD   Signed by:   Evelena Peat MD on 01/22/2010   Method used:   Electronically to        [facility]* (retail)       [address]       [place], [place]  [address]       Ph: [phone] or [phone]       Fax: [phone]   RxID:   [id number]

## 2010-06-16 NOTE — Miscellaneous (Signed)
Summary: Injection Program/[facility]  Injection Program/[facility]   Imported By: Lester Grant 11/18/2009 09:14:36  _____________________________________________________________________  External Attachment:    Type:   Image     Comment:   External Document

## 2010-06-16 NOTE — Progress Notes (Signed)
Summary: allergy shots  Phone Note Call from Patient   Caller: Patient Call For: young Summary of Call: pt wants to know how soon she can begin giving herself allergy injections. cell [phone] Initial call taken by: Tivis Ringer, CNA,  November 07, 2009 2:29 PM  Follow-up for Phone Call        pt wanting to know when she will be able to start giving herself the allergy vaccine??  looks like she just started these vaccines earlier this month.  please advise. Randell Loop CMA  November 07, 2009 2:34 PM     Pt is on buildup which takes approx 3 months and pt also has appt on August 1st-keep that appt and discuss then.Reynaldo Minium CMA  November 10, 2009 11:37 AM   Additional Follow-up for Phone Call Additional follow up Details #1::        Spoke with pt.  Informed her of above.  Pt then had concerns because she doesn't have to wait after getting the shot-she gets it and then leaves.  States she has to drive 2 hours round trip and doesn't see the point in driving this far for the shot if she doesn't have to wait after the injection.  Would like to know if she can start giving injections to herself sooner.  Gweneth Dimitri RN  November 10, 2009 11:48 AM     Additional Follow-up for Phone Call Additional follow up Details #2::    OK for allergy lab to teach to give own,  Script for epipen on medlist- she needs to get it filled. Follow-up by: Waymon Budge MD,  November 10, 2009 1:24 PM  New/Updated Medications: * ALLERGY VACCINE NEW START GO  EPIPEN 0.3 MG/0.3ML DEVI (EPINEPHRINE) For severe allergic reaction Prescriptions: EPIPEN 0.3 MG/0.3ML DEVI (EPINEPHRINE) For severe allergic reaction  #1 x prn   Entered by:   Waymon Budge MD   Authorized by:   Pulmonary Triage   Signed by:   Waymon Budge MD on 11/10/2009   Method used:   Historical   RxID:   [id number]

## 2010-06-16 NOTE — Progress Notes (Signed)
Summary: returned call  Phone Note Outgoing Call Call back at [facility] Phone [phone]   Caller: Patient Call For: young Summary of Call: pt returned call. says there was no msg.  Initial call taken by: Tivis Ringer, CNA,  October 30, 2009 9:21 AM Call placed by: Dimas Millin,  October 30, 2009 1:12 PM Call placed to: Manon Hilding Summary of Call: called placed to paient on 10-17-09 to advise that new vaccine serum ready to start allergy shots. left a message on answering machine.  Follow-up for Phone Call        I don't see anywhere in pt's chart where we have tried to call pt.  [name], did you or CY try to get in touch with this pt?  Aundra Millet Reynolds LPN  October 30, 2009 9:30 AM   Additional Follow-up for Phone Call Additional follow up Details #1::        Not from [place] since June 2. Ask the allergy labv if they tried to call. Additional Follow-up by: Waymon Budge MD,  October 30, 2009 9:48 AM    Additional Follow-up for Phone Call Additional follow up Details #2::    Dimas Millin, did you or Lynnae Sandhoff try calling this pt?  Neither [name] or CY tried calling this pt.  Aundra Millet Reynolds LPN  October 30, 2009 10:56 AM

## 2010-06-16 NOTE — Assessment & Plan Note (Signed)
Summary: 6 WK ROV // RS   Vital Signs:  Patient profile:   70 year old female Height:      64 inches Weight:      150 pounds BMI:     25.84 Temp:     98.2 degrees F oral Pulse rate:   80 / minute Resp:     14 per minute BP sitting:   124 / 80  (left arm)  Vitals Entered By: Willy Eddy, LPN (March 24, 2010 2:22 PM) CC: roa Is Patient Diabetic? No   Primary Care Provider:  Darryll Capers  CC:  roa.  History of Present Illness: the cymbalta has helped with the leg pain but the care of her mother has taken its toll the pts blood pressure ahs be "all over" under this stress sleep is poor except she requires ambien.  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) 6)  Constipation, Drug Induced  (ICD-564.09) 7)  Urinary Incontinence  (ICD-788.30) 8)  Headache  (ICD-784.0) 9)  Edema  (ICD-782.3) 10)  Frequency, Urinary  (ICD-788.41) 11)  Vitamin B Deficiency  (ICD-266.9) 12)  Sebaceous Cyst  (ICD-706.2) 13)  Osteoarthritis, 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) 14)  Candidiasis, Skin  (ICD-112.3) 15)  Fibromyalgia  (ICD-729.1) 16)  Hypothyroidism  (ICD-244.9) 17)  Gerd  (ICD-530.81) 18)  Allergic Rhinitis  (ICD-477.9)  Current Problems (verified): 1)  Preventive Health Care  (ICD-V70.0) 2)  Rotator Cuff Injury, Right Shoulder  (ICD-726.10) 3)  Other Osteoporosis  (ICD-733.09) 4)  Rhinosinusitis, 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) 5)  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 1 Once Daily 2)  Prednisone 1 Mg  Tabs (Prednisone) .... 5 Tablets By Mouth  A Day 3)  Piroxicam 20 Mg Caps (Piroxicam) .... Once Daily 4)  Hydrocodone-Acetaminophen 10-500 Mg Tabs (Hydrocodone-Acetaminophen) .... One By Mouth Two Times A Day As Needed (  This Overrides Prior Rx) 5)  Vitamin D 27253 Unit  Caps (Ergocalciferol) .... One By Mouth Weekly 6)  Cyanocobalamin 1000 Mcg/ml Inj Soln (Cyanocobalamin) .Marland Kitchen.. 1 Ml Q 6 Weeks 7)  Ambien 10 Mg  Tabs (Zolpidem Tartrate) .... Before Bedtime As Needed 8)  Ocuvite Adult Formula   Caps (Multiple Vitamins-Minerals) .... Once Daily 9)  Correctol 5 Mg  Tbec (Bisacodyl) .Marland Kitchen.. 1 At Bedtime 10)  Anacin 400-32 Mg  Tabs (Aspirin-Caffeine) .... As Needed 11)  Folic Acid 1 Mg  Tabs (Folic Acid) .... 1/2 Once Daily 12)  Clotrimazole-Betamethasone 1-0.05 % Lotn (Clotrimazole-Betamethasone) .... 2-4 Drops in Ear Two Times A Day  As Needed 13)  Multivitamins  Caps (Multiple Vitamin) .Marland Kitchen.. 1 Once Daily 14)  Sumatriptan Succinate 50 Mg Tabs (Sumatriptan Succinate) .... One By Mouth As Needed Head Ache 15)  Allergy Vaccine New Start Go 16)  Epipen 0.3 Mg/0.40ml Devi (Epinephrine) .... For Severe Allergic Reaction 17)  Cymbalta 30 Mg Cpep (Duloxetine Hcl) .Marland Kitchen.. 1 Along With 60 For Total of 90 18)  Cymbalta 30 Mg Cpep (Duloxetine Hcl) .... 3 -Total of 90-Qd  Current Medications (verified): 1)  Levothroid 150 Mcg Tabs (Levothyroxine Sodium) .Marland Kitchen.. 1 Once Daily 2)  Prednisone 1 Mg  Tabs  (Prednisone) .... 4 Tablets By Mouth  A Day 3)  Piroxicam 20 Mg Caps (Piroxicam) .... Once Daily 4)  Hydrocodone-Acetaminophen 10-500 Mg Tabs (Hydrocodone-Acetaminophen) .... One By Mouth Two Times A Day As Needed (  This Overrides Prior Rx) 5)  Vitamin D 16109 Unit  Caps (Ergocalciferol) .... One By Mouth Weekly 6)  Cyanocobalamin 1000 Mcg/ml Inj Soln (Cyanocobalamin) .Marland Kitchen.. 1 Ml Q 6 Weeks 7)  Ambien 10 Mg  Tabs (Zolpidem Tartrate) .... Before Bedtime As Needed 8)  Ocuvite Adult Formula   Caps (Multiple Vitamins-Minerals) .... Once Daily 9)  Correctol 5 Mg  Tbec (Bisacodyl) .Marland Kitchen.. 1 At Bedtime 10)  Folic Acid 1 Mg  Tabs (Folic Acid) .... 1/2 Once Daily 11)  Multivitamins  Caps (Multiple Vitamin) .Marland Kitchen.. 1 Once Daily 12)  Sumatriptan Succinate 50 Mg Tabs (Sumatriptan Succinate) .... One By Mouth As Needed Head Ache 13)  Allergy Vaccine New Start Go 14)  Epipen 0.3 Mg/0.26ml Devi (Epinephrine) .... For Severe Allergic Reaction 15)  Cymbalta 30 Mg Cpep (Duloxetine Hcl) .Marland Kitchen.. 1 Along With 60 For Total of 90 16)  Cymbalta 30 Mg Cpep (Duloxetine Hcl) .... 3 -Total of 90-Qd  Allergies (verified): 1)  ! * Lamisil  Past History:  Family History: Last updated: 08/23/2008 Family History Other cancer-Colon Fam hx Irritable bowel dementia grandchild died  Social History: Last updated: 08/23/2008 Retired Married- feels life stress Patient never smoked.   Risk Factors: Exercise: no (04/28/2007)  Risk Factors: Smoking Status: never (03/24/2010) Passive Smoke Exposure: no (03/24/2010)  Past medical, surgical, family and social histories (including risk factors) reviewed, and no changes noted (except as noted below).  Past Medical History: Reviewed history from 04/17/2008 and no changes required. Allergic rhinitis GERD Hypothyroidism Fibromyalgia Headache Urinary incontinence  Past Surgical History: Reviewed history from 10/15/2009 and no changes  required. TAH-70yo Colonoscopy-01/27/2004 Total knee replacement-right 2008, Left 2011 Rectocoel Knee arthroscopy Right rotator cuff  Family History: Reviewed history from 08/23/2008 and no changes required. Family History Other cancer-Colon Fam hx Irritable bowel dementia grandchild died  Social History: Reviewed history from 08/23/2008 and no changes required. Retired Married- feels life stress Patient never smoked.   Review of Systems  The patient denies anorexia, fever, weight loss, weight gain, vision loss, decreased hearing, hoarseness, chest pain, syncope, dyspnea on exertion, peripheral edema, prolonged cough, headaches, hemoptysis, abdominal pain, melena, hematochezia, severe indigestion/heartburn, hematuria, incontinence, genital sores, muscle weakness, suspicious skin lesions, transient blindness, difficulty walking, depression, unusual weight change, abnormal bleeding, enlarged lymph nodes, angioedema, and breast masses.    Physical Exam  General:  Well-developed,well-nourished,in no acute distress; alert,appropriate and cooperative throughout examination Head:  Normocephalic and atraumatic without obvious abnormalities. No apparent alopecia or balding. Eyes:  pupils equal and pupils round.   Ears:  R ear normal and L ear normal.   Neck:  No deformities, masses, or tenderness noted. Lungs:  normal respiratory effort and  no wheezes.   Heart:  normal rate and regular rhythm.   Abdomen:  soft and non-tender.   Pulses:  R and L carotid,radial,femoral,dorsalis pedis and posterior tibial pulses are full and equal bilaterally Extremities:  trace left pedal edema and trace right pedal edema.   Neurologic:  alert & oriented X3, DTRs symmetrical and normal, and finger-to-nose normal.     Impression & Recommendations:  Problem # 1:  FIBROMYALGIA (ICD-729.1) Assessment Unchanged  Pap smear: normal (01/14/2009) Bone Density: abnormal (12/12/2008) Td Booster: Historical  (05/17/2005)   Flu Vax: Fluvax 3+ (02/12/2010)   Pneumovax: Historical (05/17/2005) Chol: 268 (02/12/2010)   HDL: 103.00 (02/12/2010)   TG: 126.0 (02/12/2010) TSH: 0.05 (02/12/2010)   Next Bone Density due:: 12/2010 (02/12/2010)  Discussed using sunscreen, use of alcohol, drug use, self breast exam, routine dental care, routine eye care, schedule for GYN exam, routine physical exam, seat belts, multiple vitamins, osteoporosis prevention, adequate calcium intake in diet, recommendations for immunizations, mammograms and Pap smears.  Discussed exercise and checking cholesterol.  Discussed gun safety, safe sex, and contraception.  The following medications were removed from the medication list:    Anacin 400-32 Mg Tabs (Aspirin-caffeine) .Marland Kitchen... As needed Her updated medication list for this problem includes:    Piroxicam 20 Mg Caps (Piroxicam) ..... Once daily    Hydrocodone-acetaminophen 10-500 Mg Tabs (Hydrocodone-acetaminophen) ..... One by mouth two times a day as needed (  this overrides prior rx)  Problem # 2:  HYPOTHYROIDISM (ICD-244.9) Assessment: Unchanged  Her updated medication list for this problem includes:    Levothroid 150 Mcg Tabs (Levothyroxine sodium) .Marland Kitchen... 1 once daily  Labs Reviewed: TSH: 0.05 (02/12/2010)    Chol: 268 (02/12/2010)   HDL: 103.00 (02/12/2010)   TG: 126.0 (02/12/2010)  Problem # 3:  ALLERGIC RHINITIS (ICD-477.9) Assessment: Unchanged  Discussed use of allergy medications and environmental measures.   Problem # 4:  GERD (ICD-530.81) Assessment: Unchanged  Labs Reviewed: Hgb: 13.6 (08/27/2009)   Hct: 38.8 (08/27/2009)  Complete Medication List: 1)  Levothroid 150 Mcg Tabs (Levothyroxine sodium) .Marland Kitchen.. 1 once daily 2)  Prednisone 1 Mg Tabs (Prednisone) .... 4 tablets by mouth  a day 3)  Piroxicam 20 Mg Caps (Piroxicam) .... Once daily 4)  Hydrocodone-acetaminophen 10-500 Mg Tabs (Hydrocodone-acetaminophen) .... One by mouth two times a day as needed (   this overrides prior rx) 5)  Vitamin D 26948 Unit Caps (Ergocalciferol) .... One by mouth weekly 6)  Cyanocobalamin 1000 Mcg/ml Inj Soln (Cyanocobalamin) .Marland Kitchen.. 1 ml q 6 weeks 7)  Ambien 10 Mg Tabs (Zolpidem tartrate) .... Before bedtime as needed 8)  Ocuvite Adult Formula Caps (Multiple vitamins-minerals) .... Once daily 9)  Correctol 5 Mg Tbec (Bisacodyl) .Marland Kitchen.. 1 at bedtime 10)  Folic Acid 1 Mg Tabs (Folic acid) .... 1/2 once daily 11)  Multivitamins Caps (Multiple vitamin) .Marland Kitchen.. 1 once daily 12)  Sumatriptan Succinate 50 Mg Tabs (Sumatriptan succinate) .... One by mouth as needed head ache 13)  Allergy Vaccine New Start Go  14)  Epipen 0.3 Mg/0.37ml Devi (Epinephrine) .... For severe allergic reaction 15)  Cymbalta 30 Mg Cpep (Duloxetine hcl) .Marland Kitchen.. 1 along with 60 for total of 90 16)  Cymbalta 30 Mg Cpep (Duloxetine hcl) .... 3 -total of 90-qd  Patient Instructions: 1)  Please schedule a follow-up appointment in 2 months. Prescriptions: LEVOTHROID 150 MCG TABS (LEVOTHYROXINE SODIUM) 1 once daily  #30 x 6   Entered by:   Willy Eddy, LPN   Authorized by:  Stacie Glaze MD   Signed by:   Willy Eddy, LPN on 54/01/8118   Method used:   Print then Give to Patient   RxID:   [id number] CYMBALTA 30 MG CPEP (DULOXETINE HCL) 3 -total of 90-qd  #90 x 6   Entered by:   Willy Eddy, LPN   Authorized by:   Stacie Glaze MD   Signed by:   Willy Eddy, LPN on 84/69/6295   Method used:   Print then Give to Patient   RxID:   [id number] SUMATRIPTAN SUCCINATE 50 MG TABS (SUMATRIPTAN SUCCINATE) one by mouth as needed head ache  #9 Each x 2   Entered by:   Willy Eddy, LPN   Authorized by:   Stacie Glaze MD   Signed by:   Willy Eddy, LPN on 66/44/0347   Method used:   Print then Give to Patient   RxID:   [id number] HYDROCODONE-ACETAMINOPHEN 10-500 MG TABS (HYDROCODONE-ACETAMINOPHEN) one by mouth two times a day as needed (  this overrides prior  rx)  #60 x 3   Entered by:   Willy Eddy, LPN   Authorized by:   Stacie Glaze MD   Signed by:   Willy Eddy, LPN on 51/88/4166   Method used:   Print then Give to Patient   RxID:   [id number] PREDNISONE 1 MG  TABS (PREDNISONE) 4 tablets by mouth  a day  #120 x 6   Entered by:   Willy Eddy, LPN   Authorized by:   Stacie Glaze MD   Signed by:   Willy Eddy, LPN on 32/20/2542   Method used:   Print then Give to Patient   RxID:   [id number] LEVOTHROID 150 MCG TABS (LEVOTHYROXINE SODIUM) 1 once daily  #30 x 6   Entered by:   Willy Eddy, LPN   Authorized by:   Stacie Glaze MD   Signed by:   Willy Eddy, LPN on 60/73/7106   Method used:   Electronically to        [facility] (retail)       [address], [place]  [address]       Ph: [phone] or [phone]       Fax: [phone]   RxID:   [id number]    Orders Added: 1)  Est. Patient Level IV [23536]

## 2010-06-16 NOTE — Miscellaneous (Signed)
Summary: Injection Record / [facility]    Injection Record / [facility]    Imported By: Lennie Odor 11/18/2009 11:04:27  _____________________________________________________________________  External Attachment:    Type:   Image     Comment:   External Document

## 2010-06-16 NOTE — Assessment & Plan Note (Signed)
Summary: cpx/patient will come in fasting/cb   Vital Signs:  Patient profile:   70 year old female Height:      63 inches Weight:      144 pounds BMI:     25.60 Temp:     98.2 degrees F oral Pulse rate:   84 / minute Resp:     14 per minute BP sitting:   140 / 84  (left arm)  Vitals Entered By: Willy Eddy, LPN (February 12, 2010 11:21 AM)  Nutrition Counseling: Patient's BMI is greater than 25 and therefore counseled on weight management options. CC: annual visit for disease management-fasting this am59mammogram is due, colonoscopy is due Is Patient Diabetic? No  Vision Screening:Left eye with correction: 20 / 20 Right eye with correction: 20 / 20 Both eyes with correction: 20 / 20         Primary Care Provider:  Darryll Capers  CC:  annual visit for disease management-fasting this am29mammogram is due and colonoscopy is due.  History of Present Illness: HAD BRONCHITIS in sept and has not "recovered" has increased burning in legs from the diagnosed neuropathy the pt has a hx of HA from spinal dz that had responded to a spinal from [facility] ( 4 months ago) she has been on cymbalta for leg pain... has helped but the help has lessened The pt was asked about all immunizations, health maint. services that are appropriate to their age and was given guidance on diet exercize  and weight management Increased fibromyagial pain on scale of 1-10 , 9 Here for Medicare AWV:  1.   Risk factors based on Past M, S, F history:  see problem list, no increased coronary artery disease risk, increased risk of MSK ds with hxof DJD 2.   Physical Activities:  limited due to fibromyalgia, walks three time a week 3.   Depression/mood:  mild depression due to pain 4.   Hearing:  can hear whispered voice at 6 feet 5.   ADL's: performs all ADLs 6.   Fall Risk:  due to myalgias slight increased risk of fall 7.   Home Safety:  no risk noted 8.   Height, weight, &visual acuity: see vitals 9.    Counseling:  about exercize, minimizing narcotic pain medications as possible 10.   Labs ordered based on risk  see orders 11.           Referral Coordination none current 12.           Care Plan see  hand out 13.            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) 6)  Constipation, Drug Induced  (ICD-564.09) 7)  Urinary Incontinence  (ICD-788.30) 8)  Headache  (ICD-784.0) 9)  Edema  (ICD-782.3) 10)  Frequency, Urinary  (ICD-788.41) 11)  Vitamin B Deficiency  (ICD-266.9) 12)  Sebaceous Cyst  (ICD-706.2) 13)  Osteoarthritis, 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) 14)  Candidiasis, Skin  (ICD-112.3) 15)  Fibromyalgia  (ICD-729.1) 16)  Hypothyroidism  (ICD-244.9) 17)  Gerd  (ICD-530.81) 18)  Allergic Rhinitis  (ICD-477.9)  Current Problems (verified): 1)  Rotator Cuff Injury, Right Shoulder  (ICD-726.10) 2)  Other Osteoporosis  (ICD-733.09) 3)  Rhinosinusitis, 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) 4)  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 1 Once Daily 2)  Prednisone 1 Mg  Tabs (Prednisone) .... 5 Tablets By Mouth  A Day 3)  Piroxicam 20 Mg Caps (Piroxicam) .... Once Daily 4)  Hydrocodone-Acetaminophen 10-500 Mg Tabs (Hydrocodone-Acetaminophen) .... One By Mouth Two Times A Day As Needed (  This Overrides Prior Rx) 5)  Vitamin D 19147 Unit  Caps (Ergocalciferol) .... One By Mouth Weekly 6)  Cyanocobalamin 1000 Mcg/ml Inj Soln (Cyanocobalamin) .Marland Kitchen.. 1 Ml Q 6 Weeks 7)  Ambien 10 Mg  Tabs (Zolpidem Tartrate) .... Before Bedtime As Needed 8)  Ocuvite Adult Formula   Caps (Multiple Vitamins-Minerals) .... Once Daily 9)  Correctol 5 Mg  Tbec (Bisacodyl) .Marland Kitchen.. 1 At Bedtime 10)  Anacin 400-32 Mg  Tabs (Aspirin-Caffeine) .... As Needed 11)  Folic Acid 1 Mg  Tabs (Folic Acid) .... 1/2 Once Daily 12)  Clotrimazole-Betamethasone 1-0.05 % Lotn (Clotrimazole-Betamethasone) .... 2-4 Drops in Ear Two Times A Day As Needed 13)  Multivitamins  Caps (Multiple Vitamin) .Marland Kitchen.. 1 Once Daily 14)  Sumatriptan Succinate 50 Mg Tabs (Sumatriptan Succinate) .... One By Mouth As Needed Head Ache 15)  Fluticasone Propionate 50 Mcg/act Susp (Fluticasone Propionate) .Marland Kitchen.. 1-2 Puffs Each Nostril Once Daily      Nasal Steroid 16)  Allergy Vaccine New Start Go 17)  Epipen 0.3 Mg/0.67ml Devi (Epinephrine) .... For Severe Allergic Reaction 18)  Ipratropium Bromide 0.06 % Soln (Ipratropium Bromide) .Marland Kitchen.. 1-2 Puffs Each Nostril Three Times A Day As Needed 19)  Doxycycline Hyclate 100 Mg Caps (Doxycycline Hyclate) .... One By Mouth Two Times A Day For 10 Days  Current Medications (verified): 1)  Levothroid 150 Mcg Tabs (Levothyroxine Sodium) .Marland Kitchen.. 1 Once Daily 2)  Prednisone 1 Mg  Tabs (Prednisone) .... 5 Tablets By Mouth  A Day 3)  Piroxicam 20 Mg Caps (Piroxicam) .... Once Daily 4)  Hydrocodone-Acetaminophen 10-500 Mg Tabs (Hydrocodone-Acetaminophen) ....  One By Mouth Two Times A Day As Needed (  This Overrides Prior Rx) 5)  Vitamin D 82956 Unit  Caps (Ergocalciferol) .... One By Mouth Weekly 6)  Cyanocobalamin 1000 Mcg/ml Inj Soln (Cyanocobalamin) .Marland Kitchen.. 1 Ml Q 6 Weeks 7)  Ambien 10 Mg  Tabs (Zolpidem Tartrate) .... Before Bedtime As Needed 8)  Ocuvite Adult Formula   Caps (Multiple Vitamins-Minerals) .... Once Daily 9)  Correctol 5 Mg  Tbec (Bisacodyl) .Marland Kitchen.. 1 At Bedtime 10)  Anacin 400-32 Mg  Tabs (Aspirin-Caffeine) .... As Needed 11)  Folic Acid 1 Mg  Tabs (Folic Acid) .... 1/2 Once Daily 12)  Clotrimazole-Betamethasone 1-0.05 % Lotn (Clotrimazole-Betamethasone) .... 2-4 Drops in Ear Two Times A Day As Needed 13)  Multivitamins  Caps (Multiple Vitamin) .Marland Kitchen.. 1 Once Daily 14)  Sumatriptan Succinate 50 Mg Tabs (Sumatriptan Succinate) .... One By Mouth As Needed Head Ache 15)  Allergy Vaccine New Start Go 16)  Epipen 0.3 Mg/0.10ml Devi (Epinephrine) .... For Severe Allergic Reaction  Allergies (verified): 1)  ! * Lamisil  Past History:  Family History: Last updated: 08/23/2008 Family History Other cancer-Colon Fam hx Irritable bowel dementia grandchild died  Social History: Last updated: 08/23/2008 Retired Married- feels life stress Patient never smoked.   Risk Factors: Exercise: no (04/28/2007)  Risk Factors: Smoking Status: never (02/12/2010) Passive Smoke Exposure: no (02/12/2010)  Past medical, surgical, family and social histories (including risk factors) reviewed, and no changes noted (except as noted below).  Past Medical History: Reviewed history from 04/17/2008 and no changes required. Allergic rhinitis GERD Hypothyroidism Fibromyalgia Headache Urinary incontinence  Past Surgical History: Reviewed history from 10/15/2009 and no changes required. TAH-70yo Colonoscopy-01/27/2004 Total knee replacement-right 2008, Left 2011 Rectocoel Knee arthroscopy Right rotator cuff  Family History: Reviewed history from  08/23/2008 and no changes required. Family History Other cancer-Colon Fam hx Irritable bowel dementia grandchild died  Social History: Reviewed history from 08/23/2008 and no changes required. Retired Married- feels life stress Patient never smoked.   Review of Systems  The patient denies anorexia, fever, weight loss, weight gain, vision loss, decreased hearing, hoarseness, chest pain, syncope, dyspnea on exertion, peripheral edema, prolonged cough, headaches, hemoptysis, abdominal pain, melena, hematochezia, severe indigestion/heartburn, hematuria, incontinence, genital sores, muscle weakness, suspicious skin lesions, transient blindness, difficulty walking, depression, unusual weight change, abnormal bleeding, enlarged lymph nodes, angioedema, and breast masses.         Flu Vaccine Consent Questions     Do you have a history of severe allergic reactions to this vaccine? no    Any prior history of allergic reactions to egg and/or gelatin? no    Do you have a sensitivity to the preservative Thimersol? no    Do you have a past history of Guillan-Barre Syndrome? no    Do you currently have an acute febrile illness? no    Have you ever had a severe reaction to latex? no    Vaccine information given and explained to patient? yes    Are you currently pregnant? no    Lot Number:[id number]   Exp Date:11/14/2010   Site Given  Left Deltoid IM    Physical Exam  General:  Well-developed,well-nourished,in no acute distress; alert,appropriate and cooperative throughout examination Head:  Normocephalic and atraumatic without obvious abnormalities. No apparent alopecia or balding. Eyes:  pupils equal and pupils round.   Ears:  R ear normal and L ear normal.   Nose:  no external deformity and no nasal discharge.   Mouth:  Oral mucosa and oropharynx without lesions or exudates.  Teeth in good repair. Neck:  No deformities, masses, or tenderness noted. Lungs:  normal respiratory effort and no  wheezes.   Heart:  normal rate and regular rhythm.   Abdomen:  soft and non-tender.   Msk:  lumbar lordosis, SI joint tenderness, and trigger point tenderness.   Extremities:  trace left pedal edema and trace right pedal edema.   Neurologic:  alert & oriented X3, DTRs symmetrical and normal, and finger-to-nose normal.   Skin:  no rashes and no suspicious lesions.   Cervical Nodes:  No lymphadenopathy noted Psych:  Oriented X3 and not anxious appearing.     Impression & Recommendations:  Problem # 1:  PREVENTIVE HEALTH CARE (ICD-V70.0)  Assessment Unchanged The pt was asked about all immunizations, health maint. services that are appropriate to their age and was given guidance on diet exercize  and weight management  Orders: Medicare -1st Annual Wellness Visit [phone])  Pap smear: normal (01/14/2009) Bone Density: abnormal (12/12/2008) Td Booster: Historical (05/17/2005)   Flu Vax: Fluvax 3+ (02/12/2010)   Pneumovax: Historical (05/17/2005) TSH: 0.40 (08/27/2009)   Next Bone Density due:: 12/2010 (02/12/2010)  Discussed using sunscreen, use of alcohol, drug use, self breast exam, routine dental care, routine eye care, schedule for GYN exam, routine physical exam, seat belts, multiple vitamins, osteoporosis prevention, adequate calcium intake in diet, recommendations for immunizations, mammograms and Pap smears.  Discussed exercise and checking cholesterol.  Discussed gun safety, safe sex, and contraception.  Problem # 2:  OSTEOARTHRITIS, SEVERE (ICD-715.90) Assessment: Unchanged  Her updated medication list for this problem includes:    Piroxicam 20 Mg Caps (Piroxicam) ..... Once daily    Hydrocodone-acetaminophen 10-500 Mg Tabs (Hydrocodone-acetaminophen) ..... One by mouth two times a day as needed (  this overrides prior rx)    Anacin 400-32 Mg Tabs (Aspirin-caffeine) .Marland Kitchen... As needed  Discussed use of medications, application of heat or cold, and exercises.   Problem # 3:   FIBROMYALGIA (ICD-729.1) Assessment: Unchanged  Her updated medication list for this problem includes:    Piroxicam 20 Mg Caps (Piroxicam) ..... Once daily    Hydrocodone-acetaminophen 10-500 Mg Tabs (Hydrocodone-acetaminophen) ..... One by mouth two times a day as needed (  this overrides prior rx)    Anacin 400-32 Mg Tabs (Aspirin-caffeine) .Marland Kitchen... As needed  Problem # 4:  HEADACHE (ICD-784.0) Assessment: Unchanged  Her updated medication list for this problem includes:    Piroxicam 20 Mg Caps (Piroxicam) ..... Once daily    Hydrocodone-acetaminophen 10-500 Mg Tabs (Hydrocodone-acetaminophen) ..... One by mouth two times a day as needed (  this overrides prior rx)    Anacin 400-32 Mg Tabs (Aspirin-caffeine) .Marland Kitchen... As needed    Sumatriptan Succinate 50 Mg Tabs (Sumatriptan succinate) ..... One by mouth as needed head ache  Headache diary reviewed.  Complete Medication List: 1)  Levothroid 150 Mcg Tabs (Levothyroxine sodium) .Marland Kitchen.. 1 once daily 2)  Prednisone 1 Mg Tabs (Prednisone) .... 5 tablets by mouth  a day 3)  Piroxicam 20 Mg Caps (Piroxicam) .... Once daily 4)  Hydrocodone-acetaminophen 10-500 Mg Tabs (Hydrocodone-acetaminophen) .... One by mouth two times a day as needed (  this overrides prior rx) 5)  Vitamin D 57846 Unit Caps (Ergocalciferol) .... One by mouth weekly 6)  Cyanocobalamin 1000 Mcg/ml Inj Soln (Cyanocobalamin) .Marland Kitchen.. 1 ml q 6 weeks 7)  Ambien 10 Mg Tabs (Zolpidem tartrate) .... Before bedtime as needed 8)  Ocuvite Adult Formula Caps (Multiple vitamins-minerals) .... Once daily 9)  Correctol 5 Mg Tbec (Bisacodyl) .Marland Kitchen.. 1 at bedtime 10)  Anacin 400-32 Mg Tabs (Aspirin-caffeine) .... As needed 11)  Folic Acid 1 Mg Tabs (Folic acid) .... 1/2 once daily 12)  Clotrimazole-betamethasone 1-0.05 % Lotn (Clotrimazole-betamethasone) .... 2-4 drops in ear two times a day as needed 13)  Multivitamins Caps (Multiple vitamin) .Marland Kitchen.. 1 once daily 14)  Sumatriptan Succinate 50 Mg Tabs  (Sumatriptan succinate) .... One by mouth as needed head ache 15)  Allergy Vaccine New Start Go  16)  Epipen 0.3 Mg/0.56ml Devi (Epinephrine) .... For severe allergic reaction 17)  Cymbalta 30 Mg Cpep (Duloxetine hcl) .Marland Kitchen.. 1 along with 60 for total of 90 18)  Cymbalta 30 Mg Cpep (Duloxetine hcl) .Marland KitchenMarland KitchenMarland Kitchen  3 -total of 90-qd  Other Orders: Flu Vaccine 62yrs + MEDICARE PATIENTS (Z6109) Administration Flu vaccine - MCR (U0454) Gastroenterology Referral (GI) Venipuncture (09811) UA Dipstick w/o Micro (automated)  (81003) Specimen Handling (91478) Radiology Referral (Radiology) TLB-Lipid Panel (80061-LIPID) TLB-BMP (Basic Metabolic Panel-BMET) (80048-METABOL) TLB-IBC Pnl (Iron/FE;Transferrin) (83550-IBC) TLB-TSH (Thyroid Stimulating Hormone) (29562-ZHY)  Patient Instructions: 1)  Call [facility] for a repeat injection 2)  continue non the cymbalta... increased to 90 for a trial for leg pain 3)  Please schedule a follow-up appointment in 6-7weeks. 4)  you are up to date with immunizations 5)  and health manitanece recommendations Prescriptions: CYMBALTA 30 MG CPEP (DULOXETINE HCL) 1 along with 60 for total of 90  #30 x 6   Entered and Authorized by:   Stacie Glaze MD   Signed by:   Stacie Glaze MD on 02/26/2010   Method used:   Electronically to        [facility] (retail)       [address]       [place], [place]  [address]       Ph: [phone] or [phone]       Fax: [phone]   RxID:   [id number]    Preventive Care Screening  Bone Density:    Date:  12/12/2008    Next Due:  12/2010    Results:  abnormal std dev  Pap Smear:    Date:  01/14/2009    Next Due:  01/2012    Results:  normal   Last Flu Shot:    Date:  02/12/2010    Results:  Fluvax 3+  Prevention & Chronic Care Immunizations   Influenza vaccine: Fluvax 3+  (02/12/2010)   Influenza vaccine due: 01/16/2011    Tetanus booster: 05/17/2005: Historical   Tetanus booster due: 05/18/2015    Pneumococcal  vaccine: Historical  (05/17/2005)   Pneumococcal vaccine deferral: Not indicated  (02/12/2010)    H. zoster vaccine: 05/16/2009: Zostavax    Immunization comments: is up to date with immunizations  Colorectal Screening   Hemoccult: Not documented    Colonoscopy: Not documented   Colonoscopy action/deferral: GI referral  (02/12/2010)  Other Screening   Pap smear: normal  (01/14/2009)   Pap smear due: 01/2012    Mammogram: Not documented   Mammogram action/deferral: Ordered  (02/12/2010)    DXA bone density scan: abnormal  (12/12/2008)   DXA scan due: 12/2010    Smoking status: never  (02/12/2010)  Lipids   Total Cholesterol: Not documented   Lipid panel action/deferral: Lipid Panel ordered   LDL: Not documented   LDL Direct: Not documented   HDL: Not documented   Triglycerides: Not documented   Nursing Instructions: GI referral for screening colonoscopy (see order) Schedule screening mammogram (see order)    Laboratory Results   Urine Tests  Date/Time Recieved: February 12, 2010 12:58 PM  Date/Time Reported: February 12, 2010 12:58 PM   Routine Urinalysis   Color: yellow Appearance: Clear Glucose: negative   (Normal Range: Negative) Bilirubin: negative   (Normal Range: Negative) Ketone: negative   (Normal Range: Negative) Spec. Gravity: 1.020   (Normal Range: 1.003-1.035) Blood: trace-intact   (Normal Range: Negative) pH: 6.0   (Normal Range: 5.0-8.0) Protein: negative   (Normal Range: Negative) Urobilinogen: 0.2   (Normal Range: 0-1) Nitrite: negative   (Normal Range: Negative) Leukocyte Esterace: negative   (Normal Range: Negative)    Comments: Wynona Canes, CMA  February 12, 2010 12:58 PM

## 2010-06-16 NOTE — Assessment & Plan Note (Signed)
Summary: rov 2 months///kp   Copy to:  Darryll Capers Primary Provider/Referring Provider:  Darryll Capers  CC:  2 month follow up visit-Allergies..  History of Present Illness: October 15, 2009- Rhinitis Return delayed due to several surgeries since last here. No respiratory complication. This Spring had "difficult" nasal congestion - perennial but worse in Spring. Denies sneeze, itch, drainage. Worse if outdoors much. Not sensitive to temperature changes.  Daily Afrin once. Using nasonex. Denies benefit from anthistamines.  She recalls doing well after allergy vaccine from Dr [place] Callas 10 years ago,  but flared as per initial HPI. IgE 6.7 Allergy IgE specific panel -not elevated CBC-EOS 3.1% ST- modest reactions, but numerous, to grass, trees, dust mite, mold.  December 15, 2009- Allergic rhinitis, recurrent rhinosinusitis Building allergy vaccine, giving her own . She says local reactions from a couple of her skin tests stayed red for a month . She asks an alternative to Nasonex that would be cheaper. She says antihistamines in past didn't help or made her feel worse.      Preventive Screening-Counseling & Management  Alcohol-Tobacco     Smoking Status: never     Passive Smoke Exposure: no  Current Medications (verified): 1)  Levothroid 150 Mcg Tabs (Levothyroxine Sodium) .Marland Kitchen.. 1 Once Daily 2)  Prednisone 1 Mg  Tabs (Prednisone) .... 5 Tablets By Mouth  A Day 3)  Piroxicam 20 Mg Caps (Piroxicam) .... Once Daily 4)  Hydrocodone-Acetaminophen 10-500 Mg Tabs (Hydrocodone-Acetaminophen) .... One By Mouth Two Times A Day As Needed (  This Overrides Prior Rx) 5)  Vitamin D 16109 Unit  Caps (Ergocalciferol) .... One By Mouth Weekly 6)  Cyanocobalamin 1000 Mcg/ml Inj Soln (Cyanocobalamin) .Marland Kitchen.. 1 Ml Q 6 Weeks 7)  Ambien 10 Mg  Tabs (Zolpidem Tartrate) .... Before Bedtime As Needed 8)  Ocuvite Adult Formula   Caps (Multiple Vitamins-Minerals) .... Once Daily 9)  Correctol 5 Mg  Tbec (Bisacodyl)  .Marland Kitchen.. 1 At Bedtime 10)  Anacin 400-32 Mg  Tabs (Aspirin-Caffeine) .... As Needed 11)  Folic Acid 1 Mg  Tabs (Folic Acid) .... 1/2 Once Daily 12)  Clotrimazole-Betamethasone 1-0.05 % Lotn (Clotrimazole-Betamethasone) .... 2-4 Drops in Ear Two Times A Day As Needed 13)  Multivitamins  Caps (Multiple Vitamin) .Marland Kitchen.. 1 Once Daily 14)  Sumatriptan Succinate 50 Mg Tabs (Sumatriptan Succinate) .... One By Mouth As Needed Head Ache 15)  Nasonex 50 Mcg/act Susp (Mometasone Furoate) .... 2 Sprays Each Nostril Once Daily 16)  Allergy Vaccine New Start Go 17)  Epipen 0.3 Mg/0.86ml Devi (Epinephrine) .... For Severe Allergic Reaction  Allergies (verified): 1)  ! * Lamisil  Past History:  Past Medical History: Last updated: 04/17/2008 Allergic rhinitis GERD Hypothyroidism Fibromyalgia Headache Urinary incontinence  Past Surgical History: Last updated: 10/15/2009 TAH-70yo Colonoscopy-01/27/2004 Total knee replacement-right 2008, Left 2011 Rectocoel Knee arthroscopy Right rotator cuff  Family History: Last updated: 08/23/2008 Family History Other cancer-Colon Fam hx Irritable bowel dementia grandchild died  Social History: Last updated: 08/23/2008 Retired Married- feels life stress Patient never smoked.   Risk Factors: Exercise: no (04/28/2007)  Risk Factors: Smoking Status: never (12/15/2009) Passive Smoke Exposure: no (12/15/2009)  Review of Systems      See HPI       The patient complains of nasal congestion/difficulty breathing through nose and sneezing.  The patient denies shortness of breath with activity, shortness of breath at rest, productive cough, non-productive cough, coughing up blood, chest pain, irregular heartbeats, acid heartburn, indigestion, loss of appetite, weight  change, abdominal pain, difficulty swallowing, sore throat, tooth/dental problems, and headaches.    Vital Signs:  Patient profile:   70 year old female Height:      63 inches Weight:       148.13 pounds BMI:     26.33 O2 Sat:      97 % on Room air Pulse rate:   96 / minute BP sitting:   126 / 78  (right arm) Cuff size:   regular  Vitals Entered By: Reynaldo Minium CMA (December 15, 2009 2:24 PM)  O2 Flow:  Room air CC: 2 month follow up visit-Allergies.   Physical Exam  Additional Exam:  General: A/Ox3; pleasant and cooperative, NAD,  SKIN: no rash, lesions NODES: no lymphadenopathy HEENT: [place]/AT, EOM- WNL, Conjuctivae- clear, PERRLA, mild periorbital edema, TM-right hearing aid with TM scarred, Nose- watery sniffing, wipng , Throat- clear and wnl NECK: Supple w/ fair ROM, JVD- none, normal carotid impulses w/o bruits Thyroid- CHEST: Clear to P&A HEART: RRR, no m/g/r heard ABDOMEN: not obese ZOX:WRUE, nl pulses, no edema  NEURO: Grossly intact to observation      Impression & Recommendations:  Problem # 1:  ALLERGIC RHINITIS (ICD-477.9) She is complaining of watery rhinorhea and some sneeze and congestion. This is not a peak pollen season, [place]. She is building allergy vaccine well. I think there is a vasomotor rhinitis component, aggravated by being outdoors a lot recently in poor air quality. If a sample nasal antihistamine spray doesn't help, then we will try ipratropium. Her updated medication list for this problem includes:    Fluticasone Propionate 50 Mcg/act Susp (Fluticasone propionate) .Marland Kitchen... 1-2 puffs each nostril once daily      nasal steroid    Ipratropium Bromide 0.06 % Soln (Ipratropium bromide) .Marland Kitchen... 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 We will consider a sinus CT if she continues to complain.  Medications Added to Medication List This Visit: 1)  Fluticasone Propionate 50 Mcg/act Susp (Fluticasone propionate) .Marland Kitchen.. 1-2 puffs each nostril once  daily      nasal steroid 2)  Ipratropium Bromide 0.06 % Soln (Ipratropium bromide) .Marland Kitchen.. 1-2 puffs each nostril three times a day as needed  Other Orders: Est. Patient Level IV (45409)  Patient Instructions: 1)  Please schedule a follow-up appointment in 3 months. 2)  Try sample astepro nasal antinhistamine spray:  3)   1-2 puffs each nostril two times a day as needed  4)  If astepro doesn't work well, try the script for ipratropium nasal spray. 5)  Continue building allergy vaccine. When you come back we will consider moving up the concentration one last step. 6)  Script to change Nasonex to fluticasone nasal steroid spray Prescriptions: FLUTICASONE PROPIONATE 50 MCG/ACT SUSP (FLUTICASONE PROPIONATE) 1-2 puffs each nostril once daily      Nasal steroid  #1 x prn   Entered and Authorized by:   Waymon Budge MD   Signed by:   Waymon Budge MD on 12/15/2009   Method used:   Print then Give to Patient   RxID:   [id number] IPRATROPIUM BROMIDE 0.06 % SOLN (IPRATROPIUM BROMIDE) 1-2 puffs each nostril three times a day as needed  #1 vial x prn   Entered and Authorized by:   Waymon Budge MD  Signed by:   Waymon Budge MD on 12/15/2009   Method used:   Print then Give to Patient   RxID:   [id number]

## 2010-06-16 NOTE — Progress Notes (Signed)
Summary: allergy/ skin change  Phone Note Call from Patient   Caller: Patient Call For: young Summary of Call: pt had allergy testing yesterday. today her arms- especially her r. arm has big red bumps- unlike yesterday. says they don't itch. wants to know since she is a "slow developer" if this should change her serum formula? celln [phone] (first) or home # Initial call taken by: Tivis Ringer, CNA,  October 16, 2009 12:09 PM  Follow-up for Phone Call        Dr Maple Hudson pls advise if this will change the serum for pt.   Philipp Deputy [facility]  October 16, 2009 12:12 PM   Additional Follow-up for Phone Call Additional follow up Details #1::        Late reponse is a different mechanism and won't affect allergy vaccine plans. If she is uncomfortable, she can put Caladryl/ Calamine lotion on them as for poison ivy or insect sting. Additional Follow-up by: Waymon Budge MD,  October 16, 2009 1:09 PM    Additional Follow-up for Phone Call Additional follow up Details #2::    pt aware.Carron Curie CMA  October 16, 2009 1:34 PM

## 2010-06-16 NOTE — Assessment & Plan Note (Signed)
Summary: 3 month rov/njr   Vital Signs:  Patient profile:   70 year old female Height:      63 inches Weight:      146 pounds BMI:     25.96 Temp:     98.2 degrees F oral Pulse rate:   80 / minute Resp:     14 per minute BP sitting:   136 / 80  (left arm)  Vitals Entered By: Willy Eddy, LPN (November 24, 2009 1:34 PM) CC: roa   Primary Care Provider:  Darryll Capers  CC:  roa.  History of Present Illness:  Follow-Up Visit      This is a 70 year old woman who presents for Follow-up visit.  The patient denies chest pain, palpitations, dizziness, syncope, low blood sugar symptoms, high blood sugar symptoms, edema, SOB, DOE, PND, and orthopnea.  Since the last visit the patient notes no new problems or concerns.  The patient reports taking meds as prescribed.  When questioned about possible medication side effects, the patient notes none.     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) 12)  Candidiasis, Skin  (ICD-112.3) 13)  Fibromyalgia  (ICD-729.1) 14)  Hypothyroidism  (ICD-244.9) 15)  Gerd  (ICD-530.81) 16)  Allergic Rhinitis  (ICD-477.9)  Current Problems (verified): 1)  Other Osteoporosis  (ICD-733.09) 2)  Rhinosinusitis, 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) 3)  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 1 Once Daily 2)  Prednisone 1 Mg  Tabs (Prednisone) .... 5 Tablets By Mouth  A Day 3)  Piroxicam 20 Mg Caps (Piroxicam) .... Once Daily 4)  Hydrocodone-Acetaminophen 10-500 Mg Tabs (Hydrocodone-Acetaminophen) .... One By Mouth Two Times A Day As Needed (  This Overrides Prior Rx) 5)  Vitamin D 16109 Unit  Caps (Ergocalciferol) .... One By Mouth Weekly 6)  Cyanocobalamin 1000 Mcg/ml Inj Soln (Cyanocobalamin) .Marland Kitchen.. 1 Ml Q 6 Weeks 7)  Ambien 10 Mg  Tabs (Zolpidem Tartrate) .... Before Bedtime As Needed 8)  Ocuvite Adult Formula   Caps (Multiple Vitamins-Minerals) .... Once Daily 9)  Correctol 5 Mg  Tbec (Bisacodyl) .Marland Kitchen.. 1 At Bedtime 10)  Anacin 400-32 Mg  Tabs (Aspirin-Caffeine) .... As Needed 11)  Folic Acid 1 Mg  Tabs (Folic Acid) .... 1/2 Once Daily 12)  Clotrimazole-Betamethasone 1-0.05 % Lotn (Clotrimazole-Betamethasone) .... 2-4 Drops in Ear  Two Times A Day As Needed 13)  Multivitamins  Caps (Multiple Vitamin) .Marland Kitchen.. 1 Once Daily 14)  Sumatriptan Succinate 50 Mg Tabs (Sumatriptan Succinate) .... One By Mouth As Needed Head Ache 15)  Nasonex 50 Mcg/act Susp (Mometasone Furoate) .... 2 Sprays Each Nostril Once Daily 16)  Diflucan 100 Mg Tabs (Fluconazole) .Marland Kitchen.. 1 Once Daily For 7 Days 17)  Allergy Vaccine New Start Go 18)  Epipen 0.3 Mg/0.70ml Devi (Epinephrine) .... For Severe Allergic Reaction  Current Medications (verified): 1)  Levothroid 150 Mcg Tabs (Levothyroxine  Sodium) .Marland Kitchen.. 1 Once Daily 2)  Prednisone 1 Mg  Tabs (Prednisone) .... 5 Tablets By Mouth  A Day 3)  Piroxicam 20 Mg Caps (Piroxicam) .... Once Daily 4)  Hydrocodone-Acetaminophen 10-500 Mg Tabs (Hydrocodone-Acetaminophen) .... One By Mouth Two Times A Day As Needed (  This Overrides Prior Rx) 5)  Vitamin D 47829 Unit  Caps (Ergocalciferol) .... One By Mouth Weekly 6)  Cyanocobalamin 1000 Mcg/ml Inj Soln (Cyanocobalamin) .Marland Kitchen.. 1 Ml Q 6 Weeks 7)  Ambien 10 Mg  Tabs (Zolpidem Tartrate) .... Before Bedtime As Needed 8)  Ocuvite Adult Formula   Caps (Multiple Vitamins-Minerals) .... Once Daily 9)  Correctol 5 Mg  Tbec (Bisacodyl) .Marland Kitchen.. 1 At Bedtime 10)  Anacin 400-32 Mg  Tabs (Aspirin-Caffeine) .... As Needed 11)  Folic Acid 1 Mg  Tabs (Folic Acid) .... 1/2 Once Daily 12)  Clotrimazole-Betamethasone 1-0.05 % Lotn (Clotrimazole-Betamethasone) .... 2-4 Drops in Ear Two Times A Day As Needed 13)  Multivitamins  Caps (Multiple Vitamin) .Marland Kitchen.. 1 Once Daily 14)  Sumatriptan Succinate 50 Mg Tabs (Sumatriptan Succinate) .... One By Mouth As Needed Head Ache 15)  Nasonex 50 Mcg/act Susp (Mometasone Furoate) .... 2 Sprays Each Nostril Once Daily 16)  Allergy Vaccine New Start Go 17)  Epipen 0.3 Mg/0.73ml Devi (Epinephrine) .... For Severe Allergic Reaction  Allergies (verified): 1)  ! * Lamisil  Past History:  Family History: Last updated: 08/23/2008 Family History Other cancer-Colon Fam hx Irritable bowel dementia grandchild died  Social History: Last updated: 08/23/2008 Retired Married- feels life stress Patient never smoked.   Risk Factors: Exercise: no (04/28/2007)  Risk Factors: Smoking Status: never (11/24/2009) Passive Smoke Exposure: no (11/24/2009)  Past medical, surgical, family and social histories (including risk factors) reviewed, and no changes noted (except as noted below).  Past Medical History: Reviewed history from 04/17/2008 and no changes required. Allergic  rhinitis GERD Hypothyroidism Fibromyalgia Headache Urinary incontinence  Past Surgical History: Reviewed history from 10/15/2009 and no changes required. TAH-70yo Colonoscopy-01/27/2004 Total knee replacement-right 2008, Left 2011 Rectocoel Knee arthroscopy Right rotator cuff  Family History: Reviewed history from 08/23/2008 and no changes required. Family History Other cancer-Colon Fam hx Irritable bowel dementia grandchild died  Social History: Reviewed history from 08/23/2008 and no changes required. Retired Married- feels life stress Patient never smoked.   Review of Systems  The patient denies anorexia, fever, weight loss, weight gain, vision loss, decreased hearing, hoarseness, chest pain, syncope, dyspnea on exertion, peripheral edema, prolonged cough, headaches, hemoptysis, abdominal pain, melena, hematochezia, severe indigestion/heartburn, hematuria, incontinence, genital sores, muscle weakness, suspicious skin lesions, transient blindness, difficulty walking, depression, unusual weight change, abnormal bleeding, enlarged lymph nodes, angioedema, and breast masses.    Physical Exam  General:  Well-developed,well-nourished,in no acute distress; alert,appropriate and cooperative throughout examination  dusky.   Head:  Normocephalic and atraumatic without obvious abnormalities. No apparent alopecia or balding. Eyes:  pupils equal and pupils round.   Ears:  R ear  normal and L ear normal.   Nose:  no external deformity and no nasal discharge.   Mouth:  good dentition and pharynx pink and moist.   Neck:  supple and full ROM.   Lungs:  normal respiratory effort and no wheezes.   Heart:  normal rate and regular rhythm.   Abdomen:  soft, non-tender, and normal bowel sounds.   Msk:  normal ROM and no joint warmth.   Extremities:  trace left pedal edema and trace right pedal edema.   Neurologic:  alert & oriented X3 and cranial nerves II-XII intact.     Impression &  Recommendations:  Problem # 1:  HEADACHE (ICD-784.0)  Her updated medication list for this problem includes:    Piroxicam 20 Mg Caps (Piroxicam) ..... Once daily    Hydrocodone-acetaminophen 10-500 Mg Tabs (Hydrocodone-acetaminophen) ..... One by mouth two times a day as needed (  this overrides prior rx)    Anacin 400-32 Mg Tabs (Aspirin-caffeine) .Marland Kitchen... As needed    Sumatriptan Succinate 50 Mg Tabs (Sumatriptan succinate) ..... One by mouth as needed head ache  Headache diary reviewed.  Problem # 2:  FIBROMYALGIA (ICD-729.1)  Her updated medication list for this problem includes:    Piroxicam 20 Mg Caps (Piroxicam) ..... Once daily    Hydrocodone-acetaminophen 10-500 Mg Tabs (Hydrocodone-acetaminophen) ..... One by mouth two times a day as needed (  this overrides prior rx)    Anacin 400-32 Mg Tabs (Aspirin-caffeine) .Marland Kitchen... 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 1 once daily 2)  Prednisone 1 Mg Tabs (Prednisone) .... 5 tablets by mouth  a day 3)  Piroxicam 20 Mg Caps (Piroxicam) .... Once daily 4)  Hydrocodone-acetaminophen 10-500 Mg Tabs (Hydrocodone-acetaminophen) .... One by mouth two times a day as needed (  this overrides prior rx) 5)  Vitamin D 09323 Unit Caps (Ergocalciferol) .... One by mouth weekly 6)  Cyanocobalamin 1000 Mcg/ml Inj Soln (Cyanocobalamin) .Marland Kitchen.. 1 ml q 6 weeks 7)  Ambien 10 Mg Tabs (Zolpidem tartrate) .... Before bedtime as needed 8)  Ocuvite Adult Formula Caps (Multiple vitamins-minerals) .... Once daily 9)  Correctol 5 Mg Tbec (Bisacodyl) .Marland Kitchen.. 1 at bedtime 10)  Anacin 400-32 Mg Tabs (Aspirin-caffeine) .... As needed 11)  Folic Acid 1 Mg Tabs (Folic acid)  .... 1/2 once daily 12)  Clotrimazole-betamethasone 1-0.05 % Lotn (Clotrimazole-betamethasone) .... 2-4 drops in ear two times a day as needed 13)  Multivitamins Caps (Multiple vitamin) .Marland Kitchen.. 1 once daily 14)  Sumatriptan Succinate 50 Mg Tabs (Sumatriptan succinate) .... One by mouth as needed head ache 15)  Nasonex 50 Mcg/act Susp (Mometasone furoate) .... 2 sprays each nostril once daily 16)  Allergy Vaccine New Start Go  17)  Epipen 0.3 Mg/0.38ml Devi (Epinephrine) .... For severe allergic reaction  Other Orders: Venipuncture (55732) T-Vitamin D (25-Hydroxy) ([id number])  Patient Instructions: 1)  Please schedule a follow-up appointment in 2-3  months. 2)  prevention exam 3)  come fasting Prescriptions: HYDROCODONE-ACETAMINOPHEN 10-500 MG TABS (HYDROCODONE-ACETAMINOPHEN) one by mouth two times a day as needed (  this overrides prior rx)  #60 x 3   Entered and Authorized by:   Stacie Glaze MD   Signed by:   Stacie Glaze MD  on 11/24/2009   Method used:   Print then Give to Patient   RxID:   [id number] PREDNISONE 1 MG  TABS (PREDNISONE) 5 tablets by mouth  a day  #150 Each x 3   Entered by:   Willy Eddy, LPN   Authorized by:   Stacie Glaze MD   Signed by:   Willy Eddy, LPN on 86/57/8469   Method used:   Electronically to        [facility]* (retail)       [address]       [place], [place]  [address]       Ph: [phone] or [phone]       Fax: [phone]   RxID:   [id number]

## 2010-06-16 NOTE — Miscellaneous (Signed)
Summary: [name]  [name]   Imported By: Sherian Rein 11/06/2009 08:11:20  _____________________________________________________________________  External Attachment:    Type:   Image     Comment:   External Document

## 2010-06-16 NOTE — Assessment & Plan Note (Signed)
Summary: rov 3 months///kp   Copy to:  Darryll Capers Primary Provider/Referring Provider:  Darryll Capers  CC:  Follow up visit-allergies; nasal congestion.Marland Kitchen  History of Present Illness: October 15, 2009- Rhinitis Return delayed due to several surgeries since last here. No respiratory complication. This Spring had "difficult" nasal congestion - perennial but worse in Spring. Denies sneeze, itch, drainage. Worse if outdoors much. Not sensitive to temperature changes.  Daily Afrin once. Using nasonex. Denies benefit from anthistamines.  She recalls doing well after allergy vaccine from Dr [place] Callas 10 years ago,  but flared as per initial HPI. IgE 6.7 Allergy IgE specific panel -not elevated CBC-EOS 3.1% ST- modest reactions, but numerous, to grass, trees, dust mite, mold.  December 15, 2009- Allergic rhinitis, recurrent rhinosinusitis Building allergy vaccine, giving her own . She says local reactions from a couple of her skin tests stayed red for a month . She asks an alternative to Nasonex that would be cheaper. She says antihistamines in past didn't help or made her feel worse.   March 16, 2010- Allergic rhinitis, recurrent rhinosinusitis Nurse-CC: Follow up visit-allergies; nasal congestion. She is giving her own allergy shots. Not able to get higher than 0.35 ml of 1:50 @ bottle w/o local reaction. Gas furnace installed 2 years ago, used w/ heat pump. She is concenred about periorbital puffiness she says is new in last couple of years and not familial. Continues prednisone for fibromyalgia and we discussed possibility that it contributes to her facial puffiness.  Had a bronchitis in September treated by her PCP office.    Preventive Screening-Counseling & Management  Alcohol-Tobacco     Smoking Status: never  Current Medications (verified): 1)  Levothroid 150 Mcg Tabs (Levothyroxine Sodium) .Marland Kitchen.. 1 Once Daily 2)  Prednisone 1 Mg  Tabs (Prednisone) .... 5 Tablets By Mouth  A Day 3)   Piroxicam 20 Mg Caps (Piroxicam) .... Once Daily 4)  Hydrocodone-Acetaminophen 10-500 Mg Tabs (Hydrocodone-Acetaminophen) .... One By Mouth Two Times A Day As Needed (  This Overrides Prior Rx) 5)  Vitamin D 16109 Unit  Caps (Ergocalciferol) .... One By Mouth Weekly 6)  Cyanocobalamin 1000 Mcg/ml Inj Soln (Cyanocobalamin) .Marland Kitchen.. 1 Ml Q 6 Weeks 7)  Ambien 10 Mg  Tabs (Zolpidem Tartrate) .... Before Bedtime As Needed 8)  Ocuvite Adult Formula   Caps (Multiple Vitamins-Minerals) .... Once Daily 9)  Correctol 5 Mg  Tbec (Bisacodyl) .Marland Kitchen.. 1 At Bedtime 10)  Anacin 400-32 Mg  Tabs (Aspirin-Caffeine) .... As Needed 11)  Folic Acid 1 Mg  Tabs (Folic Acid) .... 1/2 Once Daily 12)  Clotrimazole-Betamethasone 1-0.05 % Lotn (Clotrimazole-Betamethasone) .... 2-4 Drops in Ear Two Times A Day As Needed 13)  Multivitamins  Caps (Multiple Vitamin) .Marland Kitchen.. 1 Once Daily 14)  Sumatriptan Succinate 50 Mg Tabs (Sumatriptan Succinate) .... One By Mouth As Needed Head Ache 15)  Allergy Vaccine New Start Go 16)  Epipen 0.3 Mg/0.28ml Devi (Epinephrine) .... For Severe Allergic Reaction 17)  Cymbalta 30 Mg Cpep (Duloxetine Hcl) .Marland Kitchen.. 1 Along With 60 For Total of 90 18)  Cymbalta 30 Mg Cpep (Duloxetine Hcl) .... 3 -Total of 90-Qd  Allergies (verified): 1)  ! 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   Risk Factors: Exercise: no (04/28/2007)  Risk Factors: Smoking Status: never (03/16/2010) Passive  Smoke Exposure: no (02/12/2010)  Review of Systems      See HPI  The patient denies shortness of breath with activity, shortness of breath at rest, productive cough, non-productive cough, coughing up blood, chest pain, irregular heartbeats, acid heartburn, indigestion, loss of appetite, weight change, abdominal pain, difficulty swallowing, sore throat, tooth/dental problems, and sneezing.    Vital Signs:  Patient profile:   70 year old female Height:      64 inches Weight:      152 pounds BMI:     26.19 O2 Sat:      95 % on Room air Pulse rate:   120 / minute BP sitting:   146 / 92  (left arm) Cuff size:   regular  Vitals Entered By: Reynaldo Minium CMA (March 16, 2010 2:47 PM)  O2 Flow:  Room air CC: Follow up visit-allergies; nasal congestion.   Physical Exam  Additional Exam:  General: A/Ox3; pleasant and cooperative, NAD,  SKIN: no rash, lesions NODES: no lymphadenopathy HEENT: [place]/AT, EOM- WNL, Conjuctivae- clear, PERRLA, mild periorbital edema, TM-right hearing aid with TM scarred, Nose- watery sniffing, wipng , Throat- clear and wnl NECK: Supple w/ fair ROM, JVD- none, normal carotid impulses w/o bruits Thyroid- CHEST: Clear to P&A HEART: RRR, no m/g/r heard ABDOMEN: not obese UJW:JXBJ, nl pulses, no edema  NEURO: Grossly intact to observation      Impression & Recommendations:  Problem # 1:  ALLERGIC RHINITIS (ICD-477.9)  I explained that allergy vaccine dosing is based on her tolerance, not a fixed target. We will have her hold at the highest dose that doesn't provoke an uncomfortable response.   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 Patient Level III (47829)  Patient Instructions: 1)  Please schedule a follow-up appointment in 6 months. 2)  Hold allergy vaccine at the highest dose that doesn't cause an uncomfortable reaction. You may have to reduce the dose by 0.1  ml in some seasons or sometimes with the first dose or two from a new bottle.

## 2010-06-16 NOTE — Progress Notes (Signed)
Summary: reaction to allergy shot  Phone Note Call from Patient   Caller: Patient Call For: young Summary of Call: pt injected vial B 0.5 last tues. says she has a red "quarter- sized- hard knot on r thigh. it is less red today and not warm to the touch but still a little itchy and still feels hard. she states that in the past she has had a slight reaction/ a "little red and itchy but never a hard knot". she saw dr Caryl Never yesterday and was given abc for her bronchitis. req to speak to nurse re cy's recs. cell [phone] Initial call taken by: Tivis Ringer, CNA,  January 23, 2010 10:23 AM  Follow-up for Phone Call        Spoke with pt.  She states that she gave herself vial B 0.5 inf on 01/13/10 and a few days later she has "hard red knot" at the inj site.  She states not itchy, painful, or warm to touch.  She states that she is also taking doxy per Dr Caryl Never for bronchitis- saw him yesterday.  Pls advise thanks Follow-up by: Vernie Murders,  January 23, 2010 10:34 AM  Additional Follow-up for Phone Call Additional follow up Details #1::        Tammy please call her and check. Is this a new vial? suggest she drop back to 0.4 for her next injection.  Additional Follow-up by: Waymon Budge MD,  January 23, 2010 1:36 PM    Additional Follow-up for Phone Call Additional follow up Details #2::    I called Mrs.Delton See Caleen Essex. and [email or web address],I didn't hear from her so I called her this morning and talked to her. There's still a knot but it's not as big as it was [place]. when she called. I ask her about pinching her thigh and going straight in,no on the pinching,yes I always go straight in. The vac. was made 12-23-09. Pt. hasn't taken her shot for this wk.. I asked Mrs.Pozzi to call us if she had anymore problems. Follow-up by: Dimas Millin,  January 26, 2010 10:46 AM

## 2010-06-16 NOTE — Progress Notes (Signed)
Summary: UTI  Phone Note Call from Patient   Caller: Patient Call For: Stacie Glaze MD Summary of Call: Pt is having UTI symptoms again and would like a RX........ [phone]  Initial call taken by: Lynann Beaver CMA,  Sep 18, 2009 11:26 AM  Follow-up for Phone Call        per dr Yvonne Kendall have cipro 250 two times a day for 5 days Follow-up by: Willy Eddy, LPN,  Sep 19, 4538 11:44 AM    New/Updated Medications: CIPROFLOXACIN HCL 250 MG TABS (CIPROFLOXACIN HCL) one by mouth two times a day x 5 days Prescriptions: CIPROFLOXACIN HCL 250 MG TABS (CIPROFLOXACIN HCL) one by mouth two times a day x 5 days  #10 x 0   Entered by:   Lynann Beaver CMA   Authorized by:   Stacie Glaze MD   Signed by:   Lynann Beaver CMA on 09/18/2009   Method used:   Electronically to        [facility] (retail[facility]       [address]       [place], [place]  [address]       Ph: [phone] or [phone]       Fax: [phone]   RxID:   [id number]  Pt. notified.  She wants Dr. Lovell Sheehan to know she has 3 rotator cuff tears........and is not having any surgery.

## 2010-06-16 NOTE — Assessment & Plan Note (Signed)
Summary: 3 MNTH ROV//SLM--PT [name] // RS   Vital Signs:  Patient profile:   70 year old female Height:      63 inches Weight:      144 pounds BMI:     25.60 Temp:     98.2 degrees F oral Pulse rate:   80 / minute Resp:     14 per minute BP sitting:   136 / 80  (left arm)  Vitals Entered By: Willy Eddy, LPN (May 26, 2009 3:06 PM) CC: roa- just had shoulder su rgery and getting ready to have left knee replacement, Abdominal Pain   Primary Care Provider:  Darryll Capers  CC:  roa- just had shoulder su rgery and getting ready to have left knee replacement and Abdominal Pain.  History of Present Illness: Total knee replacement with Charlann Boxer her fibromyagia knee pain is the worst of he pain increased sinus pressure  Dyspepsia History:      She has no alarm features of dyspepsia including no history of melena, hematochezia, dysphagia, persistent vomiting, or involuntary weight loss > 5%.  There is a prior history of GERD.  She notes that it has been less than 12 months since the last episode of GERD and that there have been breakthrough symptoms despite maximum H-2 blocker or PPI therapy.  The patient does not have a prior history of documented ulcer disease.  The dominant symptom is heartburn or acid reflux.  An H-2 blocker medication is not currently being taken.  She has no history of a positive H. Pylori serology.  A prior EGD has been done which showed moderate or severe esophagitis.     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 1 Once Daily 2)  Prednisone 1 Mg  Tabs (Prednisone) .... 5 A Day 3)  Piroxicam 20 Mg Caps (Piroxicam) .... Once Daily 4)  Hydrocodone-Acetaminophen 7.5-325 Mg  Tabs (Hydrocodone-Acetaminophen) .... 1.5 2-3 Times Q 24 Hours 5)  Vitamin D 95284 Unit  Caps (Ergocalciferol) .... One By Mouth Weekly 6)  Cyanocobalamin 1000 Mcg/ml Inj Soln (Cyanocobalamin) .Marland Kitchen.. 1 Ml Q 6 Weeks 7)  Ambien 10 Mg  Tabs (Zolpidem Tartrate) .... Before Bedtime As Needed 8)  Ocuvite Adult Formula   Caps (Multiple Vitamins-Minerals) .... Once Daily 9)  Correctol 5 Mg  Tbec (Bisacodyl) .Marland Kitchen.. 1 At Bedtime 10)  Anacin 400-32 Mg  Tabs (Aspirin-Caffeine) .... As Needed 11)  Folic Acid 1 Mg  Tabs (Folic Acid) .... One By Mouth Daily 12)  Clotrimazole-Betamethasone 1-0.05 % Lotn (Clotrimazole-Betamethasone) .... 2-4 Drops in Ear Two Times A Day As Needed 13)  Multivitamins  Caps (Multiple Vitamin) .Marland Kitchen.. 1 Once Daily 14)  Premarin 0.625 Mg/gm Crea (Estrogens, Conjugated) .... Apply 1/2 Gram Per Vagina  Q Week 15)  Sumatriptan Succinate 50 Mg Tabs (Sumatriptan Succinate) .... One By Mouth As Needed Head Ache  Current Medications (verified): 1)  Levothroid 150 Mcg Tabs (Levothyroxine Sodium) .Marland Kitchen.. 1 Once Daily 2)  Prednisone 1 Mg  Tabs (Prednisone) .... 7 A Day 3)  Piroxicam 20 Mg Caps (Piroxicam) .... Once Daily 4)  Hydrocodone-Acetaminophen 7.5-325 Mg  Tabs (Hydrocodone-Acetaminophen) .... 1.5 2-3 Times Q 24 Hours 5)  Vitamin D 16109 Unit  Caps (Ergocalciferol) .... One By Mouth Weekly 6)  Cyanocobalamin 1000 Mcg/ml Inj Soln (Cyanocobalamin) .Marland Kitchen.. 1 Ml Q 6 Weeks 7)  Ambien 10 Mg  Tabs (Zolpidem  Tartrate) .... Before Bedtime As Needed 8)  Ocuvite Adult Formula   Caps (Multiple Vitamins-Minerals) .... Once Daily 9)  Correctol 5 Mg  Tbec (Bisacodyl) .Marland Kitchen.. 1 At Bedtime 10)  Anacin 400-32 Mg  Tabs (Aspirin-Caffeine) .... As Needed 11)  Folic Acid 1 Mg  Tabs (Folic Acid) .... 1/2 Once Daily 12)  Clotrimazole-Betamethasone 1-0.05 % Lotn (Clotrimazole-Betamethasone) .... 2-4 Drops in Ear Two Times A Day As Needed 13)  Multivitamins  Caps (Multiple Vitamin) .Marland Kitchen.. 1 Once Daily 14)  Sumatriptan Succinate 50 Mg Tabs (Sumatriptan Succinate) .... One By Mouth As Needed Head Ache 15)  Nasonex 50 Mcg/act Susp (Mometasone Furoate) .... 2 Sprays Each Nostril Once Daily 16)  Provigil 200 Mg Tabs (Modafinil) .Marland Kitchen.. 1 Once Daily  Allergies (verified): 1)  ! * Lamisil  Past History:  Family History: Last updated: 08/23/2008 Family History Other cancer-Colon Fam hx Irritable bowel dementia grandchild died  Social History: Last updated: 08/23/2008 Retired Married- feels life stress Patient never smoked.   Risk Factors: Exercise: no (04/28/2007)  Risk Factors: Smoking Status: never (05/26/2009) Passive Smoke Exposure: no (05/26/2009)  Past medical, surgical, family and social histories (including risk factors) reviewed, and no changes noted (except as noted below).  Past Medical History: Reviewed history from 04/17/2008 and no changes required. Allergic rhinitis GERD Hypothyroidism Fibromyalgia Headache Urinary incontinence  Past Surgical History: Reviewed history from 08/23/2008 and no changes required. 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 Family History Other cancer-Colon Fam hx Irritable bowel dementia grandchild died  Social History: Reviewed history from 08/23/2008 and no changes required. Retired Married- feels life stress Patient never smoked.    Review of Systems       The patient complains of weight gain.  The patient denies anorexia, fever, weight loss, vision loss, decreased hearing, hoarseness, chest pain, syncope, dyspnea on exertion, peripheral edema, prolonged cough, headaches, hemoptysis, abdominal pain, melena, hematochezia, severe indigestion/heartburn, hematuria, incontinence, genital sores, muscle weakness, suspicious skin lesions, transient blindness, difficulty walking, depression, unusual weight change, abnormal bleeding, enlarged lymph nodes, angioedema, and breast masses.    Physical Exam  General:  Well-developed,well-nourished,in no acute distress; alert,appropriate and cooperative throughout examination  dusky.   Head:  Normocephalic and atraumatic without obvious abnormalities. No apparent alopecia or balding. Eyes:  pupils equal and pupils round.   Ears:  R ear normal and L ear normal.   Nose:  no external deformity and no nasal discharge.   Mouth:  posterior lymphoid hypertrophy and postnasal drip.   Neck:  supple and full ROM.   Lungs:  normal respiratory effort and normal breath sounds.   Heart:  normal rate and regular rhythm.   Abdomen:  soft and non-tender.   Msk:  normal ROM, no joint tenderness, and no joint swelling.  no joint swelling, decreased ROM,  and joint tenderness.   Pulses:  R and L carotid,radial,femoral,dorsalis pedis and posterior tibial pulses are full and equal bilaterally Extremities:  trace left pedal edema and trace right pedal edema.   Neurologic:  alert & oriented X3 and gait normal.     Impression & Recommendations:  Problem # 1:  HYPOTHYROIDISM (ICD-244.9)  Her updated medication list for this problem includes:    Levothroid 150 Mcg Tabs (Levothyroxine sodium) .Marland Kitchen... 1 once daily  Labs Reviewed: TSH: 0.39 (03/04/2009)     Problem # 2:  FIBROMYALGIA (ICD-729.1) Assessment: Unchanged  Her updated medication list for this problem includes:    Piroxicam 20 Mg Caps  (Piroxicam) ..... Once daily    Hydrocodone-acetaminophen 7.5-325 Mg Tabs (Hydrocodone-acetaminophen) .Marland Kitchen... 1.5 2-3 times q 24 hours    Anacin 400-32 Mg Tabs (Aspirin-caffeine) .Marland Kitchen... As needed  Problem # 3:  OSTEOARTHRITIS, SEVERE (ICD-715.90) Assessment: Unchanged  Her updated medication list for this problem includes:    Piroxicam 20 Mg Caps (Piroxicam) ..... Once daily    Hydrocodone-acetaminophen 7.5-325 Mg Tabs (Hydrocodone-acetaminophen) .Marland Kitchen... 1.5 2-3 times q 24 hours    Anacin 400-32 Mg Tabs (Aspirin-caffeine) .Marland Kitchen... As needed  Discussed use of medications, application of heat or cold, and exercises.   Problem # 4:  GERD (ICD-530.81)  Labs Reviewed: Hgb: 12.0 (04/17/2008)   Hct: 35.0 (04/17/2008)  Problem # 5:  ALLERGIC RHINITIS (ICD-477.9)  Her updated medication list for this problem includes:    Nasonex 50 Mcg/act Susp (Mometasone furoate) .Marland Kitchen... 2 sprays each nostril once daily  Discussed use of allergy medications and environmental measures.   Complete Medication List: 1)  Levothroid 150 Mcg Tabs (Levothyroxine sodium) .Marland Kitchen.. 1 once daily 2)  Prednisone 1 Mg Tabs (Prednisone) .... 7 a day 3)  Piroxicam 20 Mg Caps (Piroxicam) .... Once daily 4)  Hydrocodone-acetaminophen 7.5-325 Mg Tabs (Hydrocodone-acetaminophen) .... 1.5 2-3 times q 24 hours 5)  Vitamin D 42595 Unit Caps (Ergocalciferol) .... One by mouth weekly 6)  Cyanocobalamin 1000 Mcg/ml Inj Soln (Cyanocobalamin) .Marland Kitchen.. 1 ml q 6 weeks 7)  Ambien 10 Mg Tabs (Zolpidem tartrate) .... Before bedtime as needed 8)  Ocuvite Adult Formula Caps (Multiple vitamins-minerals) .... Once daily 9)  Correctol 5 Mg Tbec (Bisacodyl) .Marland Kitchen.. 1 at bedtime 10)  Anacin 400-32 Mg Tabs (Aspirin-caffeine) .... As needed 11)  Folic Acid 1 Mg Tabs (Folic acid) .... 1/2 once daily 12)  Clotrimazole-betamethasone 1-0.05 % Lotn (Clotrimazole-betamethasone) .... 2-4 drops in ear two times a day as needed 13)  Multivitamins Caps (Multiple vitamin)  .Marland Kitchen.. 1 once daily 14)  Sumatriptan Succinate 50 Mg Tabs (Sumatriptan succinate) .... One by mouth as needed head ache 15)  Nasonex 50 Mcg/act Susp (Mometasone furoate) .... 2 sprays each nostril once daily 16)  Provigil 200 Mg Tabs (Modafinil) .Marland Kitchen.. 1 once daily  Patient Instructions: 1)  saline wash bottle  every night 2)  Please schedule a follow-up appointment in 3 months. Prescriptions: AMBIEN 10 MG  TABS (ZOLPIDEM TARTRATE) before bedtime as needed  #30 x 6   Entered by:   Willy Eddy, LPN   Authorized by:   Stacie Glaze MD   Signed by:   Willy Eddy, LPN on 63/87/5643   Method used:   Print then Give to Patient   RxID:   [id number] HYDROCODONE-ACETAMINOPHEN 7.5-325 MG  TABS (HYDROCODONE-ACETAMINOPHEN) 1.5 2-3 times q 24 hours  #90 x 2   Entered by:   Willy Eddy, LPN   Authorized by:  Stacie Glaze MD   Signed by:   Willy Eddy, LPN on 51/88/4166   Method used:   Print then Give to Patient   RxID:   [id number]    Immunization History:  Zostavax History:    Zostavax # 1:  zostavax (05/16/2009)

## 2010-06-16 NOTE — Progress Notes (Signed)
Summary: bloodwork results  Phone Note Call from Patient Call back at Work Phone [phone]   Caller: Patient Call For: Stacie Glaze MD Complaint: Breathing Problems, Urinary/GYN Problems Summary of Call: pt needs blood work results Initial call taken by: Heron Sabins,  September 10, 2009 12:13 PM  Follow-up for Phone Call        stay on iron Follow-up by: Willy Eddy, LPN,  September 10, 2009 5:21 PM

## 2010-06-16 NOTE — Progress Notes (Signed)
Summary: REQ FOR RETURN CALL  Phone Note Call from Patient   Caller: Patient  [phone] Reason for Call: Talk to Nurse, Talk to Doctor Summary of Call: Pt called to speak with [facility], LPN....Pt called to adv that she went to the dentist Monday (09/29/2009) and he prescribed Amoxicillin pills at 250 mg each... Pt went back to the dentist the following Monday 10/06/2009 for a filling and then they adv pt to take another abx (Cephalexin 500mg ).Marland KitchenMarland KitchenPt adv that the dentist told her to take 4 of these pills (at one time) when she got home from the dentist..Marland KitchenPt adv that was the only med she had to take and has not taken anything else since (other than her regular meds)  Pt adv that since she took the abx and adv that she has been exp severe stomach cramps and bloating - pt denies n/v/d.... Pt wants to know if she needs to come in for appt or if she can have something sent into the [facility] to help with the sxs...Marland KitchenMarland KitchenPt was offered an OV with another physician but she adv that she doesn't have txp to [facility] for an OV... Can you advise?  Pt can be reached at [phone] with any questions or concerns.  Initial call taken by: Debbra Riding,  Oct 08, 2009 10:01 AM  Follow-up for Phone Call        per dr Lovell Sheehan- eat some Christy Gentles and that should take care of it Follow-up by: Willy Eddy, LPN,  Oct 08, 2009 11:29 AM  Additional Follow-up for Phone Call Additional follow up Details #1::        Pt given Dr. Lovell Sheehan recommendations. Additional Follow-up by: Lynann Beaver CMA,  Oct 08, 2009 11:32 AM

## 2010-06-16 NOTE — Miscellaneous (Signed)
Summary: Skin Test/[facility]  Skin Test/[facility]   Imported By: Sherian Rein 10/22/2009 13:06:19  _____________________________________________________________________  External Attachment:    Type:   Image     Comment:   External Document

## 2010-06-16 NOTE — Progress Notes (Signed)
Summary: yeast  Phone Note Call from Patient   Caller: Patient Call For: Stacie Glaze MD Summary of Call: Pt. states she is sure she has a yeast infection (extreme vaginal itching).  Took Cipro 09/18/2009.  [facility] like RX. [facility] [phone] Allergic to Lamisil Initial call taken by: Lynann Beaver CMA,  October 15, 2009 1:08 PM  Follow-up for Phone Call        diflucan 100 mg by mouth for 7 days Follow-up by: Stacie Glaze MD,  October 16, 2009 8:05 AM    New/Updated Medications: DIFLUCAN 100 MG TABS (FLUCONAZOLE) 1 once daily for 7 days Prescriptions: DIFLUCAN 100 MG TABS (FLUCONAZOLE) 1 once daily for 7 days  #7 x 0   Entered by:   Willy Eddy, LPN   Authorized by:   Stacie Glaze MD   Signed by:   Willy Eddy, LPN on 11/91/4782   Method used:   Electronically to        [facility] (retail)       [address]       [place], [place]  [address]       Ph: [phone] or [phone]       Fax: [phone]   RxID:   [id number]   Appended Document: yeast pt informed

## 2010-06-16 NOTE — Assessment & Plan Note (Signed)
Summary: ROA/FUP/RCD   Vital Signs:  Patient profile:   70 year old female Height:      63 inches Weight:      142 pounds BMI:     25.25 Temp:     98 degrees F Pulse rate:   80 / minute Resp:     14 per minute BP sitting:   140 / 80  (left arm)  Vitals Entered By: Willy Eddy, LPN (August 27, 2009 4:06 PM) CC: roa, Headache   Primary Care Provider:  Darryll Capers  CC:  roa and Headache.  History of Present Illness: Had a complication from the TKR and was admited overnight for obs and a doplet to r/o blood clod but has a significnat hematoma and had discoloration of foot knee pain is better was seen by [facility] today and was cleared the rpain is still present and MRI  by Dr Kyra Leyland is pending hx of neupathy with b12 difficiency post op anemia on iron is due monitering of her hypothyroidism  Headache HPI:      The patient comes in for chronic management of stable headaches.  Since the last visit, the frequency of headaches have not changed, and the intensity of the headaches have not changed.  The headaches generally will last anywhere from 15 minutes to 2 hours at a time.         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) 12)  Candidiasis, Skin  (ICD-112.3) 13)  Fibromyalgia  (ICD-729.1) 14)  Hypothyroidism  (ICD-244.9) 15)  Gerd  (ICD-530.81) 16)  Allergic Rhinitis  (ICD-477.9)  Current Problems (verified): 1)  Other Osteoporosis   (ICD-733.09) 2)  Rhinosinusitis, 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) 3)  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 1 Once Daily 2)  Prednisone 1 Mg  Tabs (Prednisone) .... 7 A Day 3)  Piroxicam 20 Mg Caps (Piroxicam) .... Once Daily 4)  Hydrocodone-Acetaminophen 7.5-325 Mg  Tabs (Hydrocodone-Acetaminophen) .... 1.5 2-3 Times Q 24 Hours 5)  Vitamin D 42595 Unit  Caps (Ergocalciferol) .... One By Mouth Weekly 6)  Cyanocobalamin 1000 Mcg/ml Inj Soln (Cyanocobalamin) .Marland Kitchen.. 1 Ml Q 6 Weeks 7)  Ambien 10 Mg  Tabs (Zolpidem Tartrate) .... Before Bedtime As Needed 8)  Ocuvite Adult Formula   Caps (Multiple Vitamins-Minerals) .... Once Daily 9)  Correctol 5 Mg  Tbec (Bisacodyl) .Marland Kitchen.. 1 At Bedtime 10)  Anacin 400-32 Mg  Tabs (Aspirin-Caffeine) .... As Needed 11)  Folic Acid  1 Mg  Tabs (Folic Acid) .... 1/2 Once Daily 12)  Clotrimazole-Betamethasone 1-0.05 % Lotn (Clotrimazole-Betamethasone) .... 2-4 Drops in Ear Two Times A Day As Needed 13)  Multivitamins  Caps (Multiple Vitamin) .Marland Kitchen.. 1 Once Daily 14)  Sumatriptan Succinate 50 Mg Tabs (Sumatriptan Succinate) .... One By Mouth As Needed Head Ache 15)  Nasonex 50 Mcg/act Susp (Mometasone Furoate) .... 2 Sprays Each Nostril Once Daily 16)  Provigil 200 Mg Tabs (Modafinil) .Marland Kitchen.. 1 Once Daily  Current Medications (verified): 1)  Levothroid 150 Mcg Tabs (Levothyroxine Sodium) .Marland Kitchen.. 1 Once Daily 2)  Prednisone 1 Mg  Tabs  (Prednisone) .... 7 A Day 3)  Piroxicam 20 Mg Caps (Piroxicam) .... Once Daily 4)  Hydrocodone-Acetaminophen 10-500 Mg Tabs (Hydrocodone-Acetaminophen) .... One By Mouth Two Times A Day As Needed (  This Overrides Prior Rx) 5)  Vitamin D 44010 Unit  Caps (Ergocalciferol) .... One By Mouth Weekly 6)  Cyanocobalamin 1000 Mcg/ml Inj Soln (Cyanocobalamin) .Marland Kitchen.. 1 Ml Q 6 Weeks 7)  Ambien 10 Mg  Tabs (Zolpidem Tartrate) .... Before Bedtime As Needed 8)  Ocuvite Adult Formula   Caps (Multiple Vitamins-Minerals) .... Once Daily 9)  Correctol 5 Mg  Tbec (Bisacodyl) .Marland Kitchen.. 1 At Bedtime 10)  Anacin 400-32 Mg  Tabs (Aspirin-Caffeine) .... As Needed 11)  Folic Acid 1 Mg  Tabs (Folic Acid) .... 1/2 Once Daily 12)  Clotrimazole-Betamethasone 1-0.05 % Lotn (Clotrimazole-Betamethasone) .... 2-4 Drops in Ear Two Times A Day As Needed 13)  Multivitamins  Caps (Multiple Vitamin) .Marland Kitchen.. 1 Once Daily 14)  Sumatriptan Succinate 50 Mg Tabs (Sumatriptan Succinate) .... One By Mouth As Needed Head Ache 15)  Nasonex 50 Mcg/act Susp (Mometasone Furoate) .... 2 Sprays Each Nostril Once Daily 16)  Provigil 200 Mg Tabs (Modafinil) .Marland Kitchen.. 1 Once Daily 17)  Ciprofloxacin Hcl 250 Mg Tabs (Ciprofloxacin Hcl) .... One By Mouth Two Times A Day For 7 Dyas  Allergies (verified): 1)  ! * Lamisil  Past History:  Family History: Last updated: 08/23/2008 Family History Other cancer-Colon Fam hx Irritable bowel dementia grandchild died  Social History: Last updated: 08/23/2008 Retired Married- feels life stress Patient never smoked.   Risk Factors: Exercise: no (04/28/2007)  Risk Factors: Smoking Status: never (08/27/2009) Passive Smoke Exposure: no (08/27/2009)  Past medical, surgical, family and social histories (including risk factors) reviewed, and no changes noted (except as noted below).  Past Medical History: Reviewed history from 04/17/2008 and no changes required. Allergic  rhinitis GERD Hypothyroidism Fibromyalgia Headache Urinary incontinence  Past Surgical History: Reviewed history from 08/23/2008 and no changes required. 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 Family History Other cancer-Colon Fam hx Irritable bowel dementia grandchild died  Social History: Reviewed history from 08/23/2008 and no changes required. Retired Married- feels life stress Patient never smoked.   Review of Systems  The patient denies anorexia, fever, weight loss, weight gain, vision loss, decreased hearing, hoarseness, chest pain, syncope, dyspnea on exertion, peripheral edema, prolonged cough, headaches, hemoptysis, abdominal pain, melena, hematochezia, severe indigestion/heartburn, hematuria, incontinence, genital sores, muscle weakness, suspicious skin lesions, transient blindness, difficulty walking, depression, unusual weight change, abnormal bleeding, enlarged lymph nodes, angioedema, and breast masses.    Physical Exam  General:  Well-developed,well-nourished,in no acute distress; alert,appropriate and cooperative throughout examination  dusky.   Head:  Normocephalic and atraumatic without obvious abnormalities. No apparent alopecia or balding. Eyes:  pupils equal and pupils round.   Ears:  R  ear normal and L ear normal.   Nose:  no external deformity and no nasal discharge.   Mouth:  good dentition and pharynx pink and moist.   Neck:  supple and full ROM.   Lungs:  normal respiratory effort and no wheezes.   Heart:  normal rate and regular rhythm.   Abdomen:  soft, non-tender, and normal bowel sounds.   Msk:  normal ROM and no joint warmth.   Neurologic:  alert & oriented X3 and cranial nerves II-XII intact.     Impression & Recommendations:  Problem # 1:  ALLERGIC RHINITIS (ICD-477.9)  Her updated medication list for this problem  includes:    Nasonex 50 Mcg/act Susp (Mometasone furoate) .Marland Kitchen... 2 sprays each nostril once daily  Discussed use of allergy medications and environmental measures.   Problem # 2:  HEADACHE (ICD-784.0)  Her updated medication list for this problem includes:    Piroxicam 20 Mg Caps (Piroxicam) ..... Once daily    Hydrocodone-acetaminophen 10-500 Mg Tabs (Hydrocodone-acetaminophen) ..... One by mouth two times a day as needed (  this overrides prior rx)    Anacin 400-32 Mg Tabs (Aspirin-caffeine) .Marland Kitchen... As needed    Sumatriptan Succinate 50 Mg Tabs (Sumatriptan succinate) ..... One by mouth as needed head ache  Headache diary reviewed.  Problem # 3:  FIBROMYALGIA (ICD-729.1)  Her updated medication list for this problem includes:    Piroxicam 20 Mg Caps (Piroxicam) ..... Once daily    Hydrocodone-acetaminophen 10-500 Mg Tabs (Hydrocodone-acetaminophen) ..... One by mouth two times a day as needed (  this overrides prior rx)    Anacin 400-32 Mg Tabs (Aspirin-caffeine) .Marland Kitchen... As needed  Problem # 4:  HYPOTHYROIDISM (ICD-244.9)  Her updated medication list for this problem includes:    Levothroid 150 Mcg Tabs (Levothyroxine sodium) .Marland Kitchen... 1 once daily  Orders: TLB-TSH (Thyroid Stimulating Hormone) (84443-TSH) TLB-T4 (Thyrox), Free [phone]) TLB-T3, Free (Triiodothyronine) (84481-T3FREE)  Labs Reviewed: TSH: 0.39 (03/04/2009)     Complete Medication List: 1)  Levothroid 150 Mcg Tabs (Levothyroxine sodium) .Marland Kitchen.. 1 once daily 2)  Prednisone 1 Mg Tabs (Prednisone) .... 7 a day 3)  Piroxicam 20 Mg Caps (Piroxicam) .... Once daily 4)  Hydrocodone-acetaminophen 10-500 Mg Tabs (Hydrocodone-acetaminophen) .... One by mouth two times a day as needed (  this overrides prior rx) 5)  Vitamin D 40981 Unit Caps (Ergocalciferol) .... One by mouth weekly 6)  Cyanocobalamin 1000 Mcg/ml Inj Soln (Cyanocobalamin) .Marland Kitchen.. 1 ml q 6 weeks 7)  Ambien 10 Mg Tabs (Zolpidem tartrate) .... Before bedtime as  needed 8)  Ocuvite Adult Formula Caps (Multiple vitamins-minerals) .... Once daily 9)  Correctol 5 Mg Tbec (Bisacodyl) .Marland Kitchen.. 1 at bedtime 10)  Anacin 400-32 Mg Tabs (Aspirin-caffeine) .... As needed 11)  Folic Acid 1 Mg Tabs (Folic acid) .... 1/2 once daily 12)  Clotrimazole-betamethasone 1-0.05 % Lotn (Clotrimazole-betamethasone) .... 2-4 drops in ear two times a day as needed 13)  Multivitamins Caps (Multiple vitamin) .Marland Kitchen.. 1 once daily 14)  Sumatriptan Succinate 50 Mg Tabs (Sumatriptan succinate) .... One by mouth as needed head ache 15)  Nasonex 50 Mcg/act Susp (Mometasone furoate) .... 2 sprays each nostril once daily 16)  Provigil 200 Mg Tabs (Modafinil) .Marland Kitchen.. 1 once daily 17)  Ciprofloxacin Hcl 250 Mg Tabs (Ciprofloxacin hcl) .... One by mouth two times a day for 7 dyas  Other Orders: Venipuncture (19147) TLB-B12 + Folate Pnl (82956_21308-M57/QIO) TLB-CBC Platelet - w/Differential (85025-CBCD) TLB-BMP (Basic Metabolic Panel-BMET) (80048-METABOL)  Patient Instructions: 1)  Please schedule a follow-up appointment in 3 months. Prescriptions: CIPROFLOXACIN HCL 250 MG TABS (CIPROFLOXACIN HCL) one by mouth two times a day for 7 dyas  #14 x 0   Entered and Authorized by:   Stacie Glaze MD   Signed by:   Stacie Glaze MD on 08/27/2009   Method used:   Print then Give to Patient   RxID:   [id number] HYDROCODONE-ACETAMINOPHEN 10-500 MG TABS (HYDROCODONE-ACETAMINOPHEN) one by mouth two times a day as needed (  this overrides prior rx)  #60 x 3   Entered and Authorized by:   Stacie Glaze MD   Signed by:   Stacie Glaze MD on 08/27/2009   Method used:   Print then Give to Patient   RxID:   [id number]   Laboratory Results   Urine Tests    Routine Urinalysis   Color: yellow Appearance: Clear Glucose: negative   (Normal Range: Negative) Bilirubin: negative   (Normal Range: Negative) Ketone: negative   (Normal Range: Negative) Spec. Gravity: >=1.030   (Normal Range:  1.003-1.035) Blood: 2+   (Normal Range: Negative) pH: 5.0   (Normal Range: 5.0-8.0) Protein: 1+   (Normal Range: Negative) Urobilinogen: 0.2   (Normal Range: 0-1) Nitrite: negative   (Normal Range: Negative) Leukocyte Esterace: 1+   (Normal Range: Negative)    Comments: [name]  August 27, 2009 4:13 PM

## 2010-06-17 ENCOUNTER — Encounter (INDEPENDENT_AMBULATORY_CARE_PROVIDER_SITE_OTHER): Payer: Self-pay | Admitting: *Deleted

## 2010-06-17 ENCOUNTER — Telehealth: Payer: Self-pay | Admitting: Internal Medicine

## 2010-06-18 ENCOUNTER — Ambulatory Visit: Admit: 2010-06-18 | Payer: Self-pay | Admitting: Internal Medicine

## 2010-06-18 ENCOUNTER — Encounter: Payer: Self-pay | Admitting: Internal Medicine

## 2010-06-18 NOTE — Letter (Signed)
Summary: Pre Visit Letter Revised  [facility]  [address] [place], [place] [address]   Phone: [phone]  Fax: [phone]        04/27/2010 MRN: [id number] [facility] [address] [place], [place]  [address]             Procedure Date:  05/27/2010  Welcome to the Gastroenterology Division at [facility].    You are scheduled to see a nurse for your pre-procedure visit on 05/19/2010 at 4:00PM on the 3rd floor at [facility], [address]  We ask that you try to arrive at our office 15 minutes prior to your appointment time to allow for check-in.  Please take a minute to review the attached form.  If you answer "Yes" to one or more of the questions on the first page, we ask that you call the person listed at your earliest opportunity.  If you answer "No" to all of the questions, please complete the rest of the form and bring it to your appointment.    Your nurse visit will consist of discussing your medical and surgical history, your immediate family medical history, and your medications.   If you are unable to list all of your medications on the form, please bring the medication bottles to your appointment and we will list them.  We will need to be aware of both prescribed and over the counter drugs.  We will need to know exact dosage information as well.    Please be prepared to read and sign documents such as consent forms, a financial agreement, and acknowledgement forms.  If necessary, and with your consent, a friend or relative is welcome to sit-in on the nurse visit with you.  Please bring your insurance card so that we may make a copy of it.  If your insurance requires a referral to see a specialist, please bring your referral form from your primary care physician.  No co-pay is required for this nurse visit.     If you cannot keep your appointment, please call [phone] to cancel or reschedule prior to your appointment date.  This  allows [place] the opportunity to schedule an appointment for another patient in need of care.    Thank you for choosing [facility] for your medical needs.  We appreciate the opportunity to care for you.  Please visit [place] at our website  to learn more about our practice.  Sincerely, The Gastroenterology Division

## 2010-06-24 NOTE — Miscellaneous (Signed)
Summary: [facility] Previsit/prep  Clinical Lists Changes  Medications: Added new medication of MOVIPREP 100 GM  SOLR (PEG-KCL-NACL-NASULF-NA ASC-C) As per prep instructions. - Signed Rx of MOVIPREP 100 GM  SOLR (PEG-KCL-NACL-NASULF-NA ASC-C) As per prep instructions.;  #1 x 0;  Signed;  Entered by: Wyona Almas RN;  Authorized by: Hilarie Fredrickson MD;  Method used: Electronically to [facility]*, [address], [place], [place]  [address], Ph: [phone] or [phone], Fax: [phone] Allergies: Added new allergy or adverse reaction of * PERFUMES Added new allergy or adverse reaction of * SEVERE ENVIORNMENT ALLERGIES    Prescriptions: MOVIPREP 100 GM  SOLR (PEG-KCL-NACL-NASULF-NA ASC-C) As per prep instructions.  #1 x 0   Entered by:   Wyona Almas RN   Authorized by:   Hilarie Fredrickson MD   Signed by:   Wyona Almas RN on 06/18/2010   Method used:   Electronically to        [facility]* (retail)       [address]       [place], [place]  [address]       Ph: [phone] or [phone]       Fax: [phone]   RxID:   [id number]

## 2010-06-24 NOTE — Progress Notes (Signed)
Summary: Advise if colon is due  Phone Note Other Incoming   Caller: Alvino Chapel EdmonsonRN/PV Details for Reason: Is she due for colon Summary of Call: According to Ms. Nuzum's chart she had a colon in 01/27/04 which showed diverticulosis.  Her family hx is in her paternal Uncle and 2 paternal Aunts.  Please advise as to whether she should have colon now or later.  Thank you, Alvino Chapel Initial call taken by: Wyona Almas RN,  June 17, 2010 6:05 PM  Follow-up for Phone Call        If I reviewed that chart and recommended a follow up, then yes, now.  If not, please forward the chart and I will happily review it. Follow-up by: Hilarie Fredrickson MD,  June 18, 2010 5:11 PM  Additional Follow-up for Phone Call Additional follow up Details #1::        Chart forwarded to your desk. Additional Follow-up by: Wyona Almas RN,  June 19, 2010 9:09 AM    Additional Follow-up for Phone Call Additional follow up Details #2::    Thanks.Hilarie Fredrickson MD  June 19, 2010 9:28 AM  I reviewed the chart. If she is having no clinical problems or relevant symptoms, the appropriate time for her followup would be September 2015, based on current guidelines.Hilarie Fredrickson MD  June 19, 2010 1:13 PM   Follow-up by: Wyona Almas RN,  June 19, 2010 1:39 PM  Additional Follow-up for Phone Call Additional follow up Details #3:: Details for Additional Follow-up Action Taken: Message left on pt. home phone.  Recall for 2015 in EPIC Additional Follow-up by: Wyona Almas RN,  June 19, 2010 1:39 PM

## 2010-06-24 NOTE — Letter (Signed)
Summary: [facility] Instructions  [facility]  [address] [place], [place] [address]   Phone: [phone]  Fax: [phone]       [name]    1940-07-23    MRN: [id number]        Procedure Day Dorna Bloom:  Lenor Coffin  07/02/10     Arrival Time:  9:00AM     Procedure Time:  10:00AM     Location of Procedure:                    [facility] _  [facility] (4th Floor)                      PREPARATION FOR COLONOSCOPY WITH MOVIPREP   Starting 5 days prior to your procedure 06/27/10 do not eat nuts, seeds, popcorn, corn, beans, peas,  salads, or any raw vegetables.  Do not take any fiber supplements (e.g. Metamucil, Citrucel, and Benefiber).  THE DAY BEFORE YOUR PROCEDURE         DATE: 07/01/10  DAY: WEDNESDAY  1.  Drink clear liquids the entire day-NO SOLID FOOD  2.  Do not drink anything colored red or purple.  Avoid juices with pulp.  No orange juice.  3.  Drink at least 64 oz. (8 glasses) of fluid/clear liquids during the day to prevent dehydration and help the prep work efficiently.  CLEAR LIQUIDS INCLUDE: Water Jello Ice Popsicles Tea (sugar ok, no milk/cream) Powdered fruit flavored drinks Coffee (sugar ok, no milk/cream) Gatorade Juice: apple, white grape, white cranberry  Lemonade Clear bullion, consomm, broth Carbonated beverages (any kind) Strained chicken noodle soup Hard Candy                             4.  In the morning, mix first dose of MoviPrep solution:    Empty 1 Pouch A and 1 Pouch B into the disposable container    Add lukewarm drinking water to the top line of the container. Mix to dissolve    Refrigerate (mixed solution should be used within 24 hrs)  5.  Begin drinking the prep at 5:00 p.m. The MoviPrep container is divided by 4 marks.   Every 15 minutes drink the solution down to the next mark (approximately 8 oz) until the full liter is complete.   6.  Follow completed prep with 16 oz of clear liquid of your choice  (Nothing red or purple).  Continue to drink clear liquids until bedtime.  7.  Before going to bed, mix second dose of MoviPrep solution:    Empty 1 Pouch A and 1 Pouch B into the disposable container    Add lukewarm drinking water to the top line of the container. Mix to dissolve    Refrigerate  THE DAY OF YOUR PROCEDURE      DATE: 07/02/10   DAY: THURSDAY  Beginning at 5:00AM (5 hours before procedure):         1. Every 15 minutes, drink the solution down to the next mark (approx 8 oz) until the full liter is complete.  2. Follow completed prep with 16 oz. of clear liquid of your choice.    3. You may drink clear liquids until 8:00AM (2 HOURS BEFORE PROCEDURE).   MEDICATION INSTRUCTIONS  Unless otherwise instructed, you should take regular prescription medications with a small sip of water   as early as possible the morning of  your procedure.        OTHER INSTRUCTIONS  You will need a responsible adult at least 71 years of age to accompany you and drive you home.   This person must remain in the waiting room during your procedure.  Wear loose fitting clothing that is easily removed.  Leave jewelry and other valuables at home.  However, you may wish to bring a book to read or  an iPod/MP3 player to listen to music as you wait for your procedure to start.  Remove all body piercing jewelry and leave at home.  Total time from sign-in until discharge is approximately 2-3 hours.  You should go home directly after your procedure and rest.  You can resume normal activities the  day after your procedure.  The day of your procedure you should not:   Drive   Make legal decisions   Operate machinery   Drink alcohol   Return to work  You will receive specific instructions about eating, activities and medications before you leave.    The above instructions have been reviewed and explained to me by  Wyona Almas RN  June 18, 2010 5:37 PM     I fully  understand and can verbalize these instructions _____________________________ Date _________

## 2010-07-02 ENCOUNTER — Other Ambulatory Visit: Payer: Self-pay | Admitting: Internal Medicine

## 2010-07-02 NOTE — Assessment & Plan Note (Signed)
Summary: 2 month rov/njr   Vital Signs:  Patient profile:   70 year old female Weight:      158 pounds Temp:     98.4 degrees F oral Pulse rate:   102 / minute Pulse rhythm:   regular BP sitting:   140 / 81  (left arm) Cuff size:   regular  Vitals Entered By: Alfred Levins, CMA (June 08, 2010 1:28 PM) CC: f/u, Headache   Primary Care Provider:  Darryll Capers  CC:  f/u and Headache.  History of Present Illness: follow up fibromyalgia increased head ache pattern wth contribution for cervical arthritis reviwed medicines and pain control GERD stable increased stressors with eldercare issues  Headache HPI:      The patient comes in for chronic management of headaches which have been unstable.  Since the last visit, the frequency of headaches have increased, but the intensity of the headaches have not changed.  The headaches generally will last anywhere from 15 minutes to 2 hours at a time.  She has approximately 4 headaches per month.        Positive alarm features include change in frequency from prior H/A's and change in severity from prior H/A's.  The patient denies first or worst H/A of life, change in features from prior H/A's, new onset H/A's in middle-age or later, new or progressive H/A lasting days, H/A's with Valsalva (cough/sneeze), mylagia, fever, malaise, weight loss, jaw claudication, focal neurologic symptoms, confusion, seizures, and impaired level of consciousness.         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 1 Once Daily 2)  Prednisone 1 Mg  Tabs (Prednisone) .... 4 Tablets By Mouth  A Day 3)  Piroxicam 20 Mg Caps (Piroxicam) .... Once Daily 4)  Hydrocodone-Acetaminophen 10-500 Mg Tabs (Hydrocodone-Acetaminophen) .... One By Mouth Two Times A Day As Needed (  This Overrides Prior Rx) 5)  Vitamin D 81191 Unit  Caps (Ergocalciferol) .... One By Mouth Weekly 6)  Cyanocobalamin 1000 Mcg/ml Inj Soln (Cyanocobalamin) .Marland Kitchen.. 1 Ml Q 6 Weeks 7)  Ambien 10 Mg  Tabs (Zolpidem Tartrate) .... Before Bedtime As Needed 8)  Ocuvite Adult Formula   Caps (Multiple Vitamins-Minerals) .... Once Daily 9)  Correctol 5 Mg  Tbec (Bisacodyl) .Marland Kitchen.. 1 At Bedtime 10)  Folic Acid 1 Mg  Tabs (Folic Acid) .... 1/2 Once Daily 11)  Multivitamins  Caps (Multiple Vitamin) .Marland Kitchen.. 1 Once Daily 12)  Sumatriptan Succinate 50 Mg Tabs (Sumatriptan Succinate) .... One By Mouth As Needed Head Ache 13)  Allergy Vaccine New Start Go 14)  Epipen 0.3 Mg/0.76ml Devi (Epinephrine) .... For Severe Allergic Reaction 15)  Cymbalta 30 Mg Cpep (Duloxetine Hcl) .Marland Kitchen.. 1 Along  With 60 For Total of 90 16)  Cymbalta 30 Mg Cpep (Duloxetine Hcl) .... 3 -Total of 90-Qd  Current Medications (verified): 1)  Levothroid 150 Mcg Tabs (Levothyroxine Sodium) .Marland Kitchen.. 1 Once Daily 2)  Prednisone 1 Mg  Tabs (Prednisone) .... 4 Tablets By Mouth  A Day 3)  Piroxicam 20 Mg Caps (Piroxicam) .... Once Daily 4)  Hydrocodone-Acetaminophen 10-500 Mg Tabs (Hydrocodone-Acetaminophen) .... One By Mouth Two Times A Day As Needed (  This Overrides Prior Rx) 5)  Vitamin D 16109 Unit  Caps (Ergocalciferol)  .... One By Mouth Weekly 6)  Cyanocobalamin 1000 Mcg/ml Inj Soln (Cyanocobalamin) .Marland Kitchen.. 1 Ml Q 6 Weeks 7)  Ambien 10 Mg  Tabs (Zolpidem Tartrate) .... Before Bedtime As Needed 8)  Ocuvite Adult Formula   Caps (Multiple Vitamins-Minerals) .... Once Daily 9)  Correctol 5 Mg  Tbec (Bisacodyl) .Marland Kitchen.. 1 At Bedtime 10)  Folic Acid 1 Mg  Tabs (Folic Acid) .... 1/2 Once Daily 11)  Multivitamins  Caps (Multiple Vitamin) .Marland Kitchen.. 1 Once Daily 12)  Sumatriptan Succinate 100 Mg Tabs (Sumatriptan Succinate) .... One By Mouth As Need For Migraines 13)  Allergy Vaccine New Start Go 14)  Epipen 0.3 Mg/0.29ml Devi (Epinephrine) .... For Severe Allergic Reaction 15)  Cymbalta 30 Mg Cpep (Duloxetine Hcl) .... 3 -Total of 90-Qd  Allergies (verified): 1)  ! * Lamisil 2)  ! * Perfumes 3)  ! * Severe Enviornment Allergies  Past History:  Family History: Last updated: 08/23/2008 Family History Other cancer-Colon Fam hx Irritable bowel dementia grandchild died  Social History: Last updated: 08/23/2008 Retired Married- feels life stress Patient never smoked.   Risk Factors: Exercise: no (04/28/2007)  Risk Factors: Smoking Status: never (06/08/2010) Passive Smoke Exposure: no (03/24/2010)  Past medical, surgical, family and social histories (including risk factors) reviewed, and no changes noted (except as noted below).  Past Medical History: Reviewed history from 04/17/2008 and no changes required. Allergic rhinitis GERD Hypothyroidism Fibromyalgia Headache Urinary incontinence  Past Surgical History: Reviewed history from 10/15/2009 and no changes required. TAH-70yo Colonoscopy-01/27/2004 Total knee replacement-right 2008, Left 2011 Rectocoel Knee arthroscopy Right rotator cuff  Family History: Reviewed history from 08/23/2008 and no changes required. Family History Other cancer-Colon Fam hx Irritable bowel dementia grandchild died  Social History: Reviewed history from 08/23/2008  and no changes required. Retired Married- feels life stress Patient never smoked.   Review of Systems  The patient denies anorexia, fever, weight loss, weight gain, vision loss, decreased hearing, hoarseness, chest pain, syncope, dyspnea on exertion, peripheral edema, prolonged cough, headaches, hemoptysis, abdominal pain, melena, hematochezia, severe indigestion/heartburn, hematuria, incontinence, genital sores, muscle weakness, suspicious skin lesions, transient blindness, difficulty walking, depression, unusual weight change, abnormal bleeding, enlarged lymph nodes, angioedema, and breast masses.    Physical Exam  General:  Well-developed,well-nourished,in no acute distress; alert,appropriate and cooperative throughout examination Head:  Normocephalic and atraumatic without obvious abnormalities. No apparent alopecia or balding. Eyes:  pupils equal and pupils round.   Ears:  R ear normal and L ear normal.   Nose:  no external deformity and no nasal discharge.   Mouth:  Oral mucosa and oropharynx without lesions or exudates.  Teeth in good repair. Neck:  No deformities, masses, or tenderness noted. Lungs:  normal respiratory effort and no wheezes.   Heart:  normal rate and regular rhythm.   Abdomen:  soft and non-tender.   Msk:  lumbar lordosis, SI joint tenderness, and trigger point tenderness.   Pulses:  R and L carotid,radial,femoral,dorsalis  pedis and posterior tibial pulses are full and equal bilaterally Extremities:  trace left pedal edema and trace right pedal edema.   Neurologic:  alert & oriented X3, DTRs symmetrical and normal, and finger-to-nose normal.     Impression & Recommendations:  Problem # 1:  HEADACHE (ICD-784.0) Assessment Deteriorated increased episodes due to increased precipitants Discussed medication use, applications of heat or ice, and exercises.   Her updated medication list for this problem includes:    Piroxicam 20 Mg Caps (Piroxicam) ..... Once  daily    Hydrocodone-acetaminophen 10-500 Mg Tabs (Hydrocodone-acetaminophen) ..... One by mouth two times a day as needed (  this overrides prior rx)    Sumatriptan Succinate 100 Mg Tabs (Sumatriptan succinate) ..... One by mouth as need for migraines  Problem # 2:  FIBROMYALGIA (ICD-729.1) Assessment: Unchanged  Her updated medication list for this problem includes:    Piroxicam 20 Mg Caps (Piroxicam) ..... Once daily    Hydrocodone-acetaminophen 10-500 Mg Tabs (Hydrocodone-acetaminophen) ..... One by mouth two times a day as needed (  this overrides prior rx)  Problem # 3:  HYPOTHYROIDISM (ICD-244.9) Assessment: Unchanged  Her updated medication list for this problem includes:    Levothroid 150 Mcg Tabs (Levothyroxine sodium) .Marland Kitchen... 1 once daily  Labs Reviewed: TSH: 0.05 (02/12/2010)    Chol: 268 (02/12/2010)   HDL: 103.00 (02/12/2010)   TG: 126.0 (02/12/2010)  Problem # 4:  GERD (ICD-530.81) Assessment: Unchanged stable Labs Reviewed: Hgb: 13.6 (08/27/2009)   Hct: 38.8 (08/27/2009)  Problem # 5:  CERVICAL RADICULOPATHY (ICD-723.4) status post Ramos injections for neck pain and this is contributing to pain  Complete Medication List: 1)  Levothroid 150 Mcg Tabs (Levothyroxine sodium) .Marland Kitchen.. 1 once daily 2)  Prednisone 1 Mg Tabs (Prednisone) .... 4 tablets by mouth  a day 3)  Piroxicam 20 Mg Caps (Piroxicam) .... Once daily 4)  Hydrocodone-acetaminophen 10-500 Mg Tabs (Hydrocodone-acetaminophen) .... One by mouth two times a day as needed (  this overrides prior rx) 5)  Vitamin D 25366 Unit Caps (Ergocalciferol) .... One by mouth weekly 6)  Cyanocobalamin 1000 Mcg/ml Inj Soln (Cyanocobalamin) .Marland Kitchen.. 1 ml q 6 weeks 7)  Ambien 10 Mg Tabs (Zolpidem tartrate) .... Before bedtime as needed 8)  Ocuvite Adult Formula Caps (Multiple vitamins-minerals) .... Once daily 9)  Correctol 5 Mg Tbec (Bisacodyl) .Marland Kitchen.. 1 at bedtime 10)  Folic Acid 1 Mg Tabs (Folic acid) .... 1/2 once daily 11)   Multivitamins Caps (Multiple vitamin) .Marland Kitchen.. 1 once daily 12)  Sumatriptan Succinate 100 Mg Tabs (Sumatriptan succinate) .... One by mouth as need for migraines 13)  Allergy Vaccine New Start Go  14)  Epipen 0.3 Mg/0.57ml Devi (Epinephrine) .... For severe allergic reaction 15)  Cymbalta 30 Mg Cpep (Duloxetine hcl) .... 3 -total of 90-qd 16)  Moviprep 100 Gm Solr (Peg-kcl-nacl-nasulf-na asc-c) .... As per prep instructions.  Patient Instructions: 1)  Please schedule a follow-up appointment in 3 months. Prescriptions: LEVOTHROID 150 MCG TABS (LEVOTHYROXINE SODIUM) 1 once daily  #31 x 11   Entered and Authorized by:   Stacie Glaze MD   Signed by:   Stacie Glaze MD on 06/08/2010   Method used:   Print then Give to Patient   RxID:   [id number] HYDROCODONE-ACETAMINOPHEN 10-500 MG TABS (HYDROCODONE-ACETAMINOPHEN) one by mouth two times a day as needed (  this overrides prior rx)  #68 x 3   Entered and Authorized by:   Stacie Glaze MD   Signed by:  Stacie Glaze MD on 06/08/2010   Method used:   Print then Give to Patient   RxID:   [id number] SUMATRIPTAN SUCCINATE 100 MG TABS (SUMATRIPTAN SUCCINATE) one by mouth as need for migraines  #9 x 11   Entered and Authorized by:   Stacie Glaze MD   Signed by:   Stacie Glaze MD on 06/08/2010   Method used:   Electronically to        [facility]* (retail)       [address]       [place], [place]  [address]       Ph: [phone] or [phone]       Fax: [phone]   RxID:   [id number]    Orders Added: 1)  Est. Patient Level IV [95638]

## 2010-08-02 LAB — TYPE AND SCREEN
ABO/RH(D): A POS
Antibody Screen: NEGATIVE

## 2010-08-02 LAB — DIFFERENTIAL
Basophils Absolute: 0 10*3/uL (ref 0.0–0.1)
Basophils Relative: 1 % (ref 0–1)
Eosinophils Absolute: 0.2 10*3/uL (ref 0.0–0.7)
Eosinophils Relative: 4 % (ref 0–5)
Lymphocytes Relative: 43 % (ref 12–46)
Lymphs Abs: 1.9 10*3/uL (ref 0.7–4.0)
Monocytes Absolute: 0.4 10*3/uL (ref 0.1–1.0)
Monocytes Relative: 9 % (ref 3–12)
Neutro Abs: 1.9 10*3/uL (ref 1.7–7.7)
Neutrophils Relative %: 43 % (ref 43–77)

## 2010-08-02 LAB — BASIC METABOLIC PANEL
BUN: 15 mg/dL (ref 6–23)
BUN: 11 mg/dL (ref 6–23)
Calcium: 8.1 mg/dL — ABNORMAL LOW (ref 8.4–10.5)
Calcium: 8.7 mg/dL (ref 8.4–10.5)
Chloride: 102 mEq/L (ref 96–112)
Creatinine, Ser: 0.57 mg/dL (ref 0.4–1.2)
GFR calc non Af Amer: >60 mL/min (ref >60)
GFR calc non Af Amer: >60 mL/min (ref >60)
Glucose, Bld: 99 mg/dL (ref 70–99)
Potassium: 4 mEq/L (ref 3.5–5.1)
Potassium: 3.5 mEq/L (ref 3.5–5.1)
Sodium: 137 mEq/L (ref 135–145)

## 2010-08-02 LAB — URINALYSIS, ROUTINE W REFLEX MICROSCOPIC
Glucose, UA: NEGATIVE mg/dL
Hgb urine dipstick: NEGATIVE
Specific Gravity, Urine: 1.015 (ref 1.005–1.030)

## 2010-08-02 LAB — CBC
HCT: 37.4 % (ref 36.0–46.0)
HCT: 31 % — ABNORMAL LOW (ref 36.0–46.0)
Hemoglobin: 12.3 g/dL (ref 12.0–15.0)
Hemoglobin: 10.5 g/dL — ABNORMAL LOW (ref 12.0–15.0)
MCHC: 32.9 g/dL (ref 30.0–36.0)
MCHC: 33.5 g/dL (ref 30.0–36.0)
MCV: 94.7 fL (ref 78.0–100.0)
Platelets: 200 10*3/uL (ref 150–400)
Platelets: 118 10*3/uL — ABNORMAL LOW (ref 150–400)
Platelets: 149 10*3/uL — ABNORMAL LOW (ref 150–400)
RBC: 3.96 MIL/uL (ref 3.87–5.11)
RDW: 14.4 % (ref 11.5–15.5)
RDW: 14.5 % (ref 11.5–15.5)
WBC: 4.4 10*3/uL (ref 4.0–10.5)
WBC: 7 10*3/uL (ref 4.0–10.5)

## 2010-08-17 ENCOUNTER — Telehealth: Payer: Self-pay | Admitting: *Deleted

## 2010-08-17 DIAGNOSIS — M797 Fibromyalgia: Secondary | ICD-10-CM

## 2010-08-17 MED ORDER — PREDNISONE 5 MG PO TABS: 5.0000 mg | ORAL_TABLET | Freq: Every day | ORAL | Status: DC

## 2010-08-17 NOTE — Telephone Encounter (Signed)
Ok to give 5 mg of prednisone per dr Lovell Sheehan

## 2010-08-17 NOTE — Telephone Encounter (Signed)
Prednisone 5mg  #12 refill.  [facility]  Used extra Prednisone for osteoarthritis in [place].

## 2010-08-28 ENCOUNTER — Encounter: Payer: Self-pay | Admitting: Internal Medicine

## 2010-09-07 ENCOUNTER — Encounter: Payer: Self-pay | Admitting: Internal Medicine

## 2010-09-07 ENCOUNTER — Ambulatory Visit (INDEPENDENT_AMBULATORY_CARE_PROVIDER_SITE_OTHER): Payer: Medicare Other | Admitting: Internal Medicine

## 2010-09-07 VITALS — BP 134/80 | HR 76 | Temp 98.2°F | Resp 14 | Ht 64.0 in | Wt 148.0 lb

## 2010-09-07 DIAGNOSIS — E039 Hypothyroidism, unspecified: Secondary | ICD-10-CM

## 2010-09-07 DIAGNOSIS — E538 Deficiency of other specified B group vitamins: Secondary | ICD-10-CM

## 2010-09-07 DIAGNOSIS — M1990 Unspecified osteoarthritis, unspecified site: Secondary | ICD-10-CM

## 2010-09-07 DIAGNOSIS — IMO0001 Reserved for inherently not codable concepts without codable children: Secondary | ICD-10-CM

## 2010-09-07 DIAGNOSIS — K219 Gastro-esophageal reflux disease without esophagitis: Secondary | ICD-10-CM

## 2010-09-07 DIAGNOSIS — L570 Actinic keratosis: Secondary | ICD-10-CM | POA: Insufficient documentation

## 2010-09-07 LAB — TSH: TSH: 0.15 u[IU]/mL — ABNORMAL LOW (ref 0.35–5.50)

## 2010-09-07 LAB — T4, FREE: Free T4: 0.99 ng/dL (ref 0.60–1.60)

## 2010-09-07 LAB — T3, FREE: T3, Free: 2.4 pg/mL (ref 2.3–4.2)

## 2010-09-07 MED ORDER — CYANOCOBALAMIN 500 MCG SL SUBL: 1.0000 | SUBLINGUAL_TABLET | Freq: Every day | SUBLINGUAL | Status: DC

## 2010-09-07 NOTE — Progress Notes (Signed)
  Subjective:    Patient ID: [name], female    DOB: Aug 04, 1940, 70 y.o.   MRN: [id number]  HPI Pt has been able to wean off many meds for sleep and neuropathy with good result and less pain. She has stopped the feldene and has continued the prednisone only. Her fibromyalgia is improved. She feels better She needs monitoring of Thyroid. OA stable    Review of Systems  Constitutional: Negative for activity change, appetite change and fatigue.  HENT: Negative for ear pain, congestion, neck pain, postnasal drip and sinus pressure.   Eyes: Negative for redness and visual disturbance.  Respiratory: Negative for cough, shortness of breath and wheezing.   Gastrointestinal: Negative for abdominal pain and abdominal distention.  Genitourinary: Negative for dysuria, frequency and menstrual problem.  Musculoskeletal: Negative for myalgias, joint swelling and arthralgias.  Skin: Negative for rash and wound.  Neurological: Negative for dizziness, weakness and headaches.  Hematological: Negative for adenopathy. Does not bruise/bleed easily.  Psychiatric/Behavioral: Negative for sleep disturbance and decreased concentration.       Objective:   Physical Exam  Constitutional: She is oriented to person, place, and time. She appears well-developed and well-nourished. No distress.  HENT:  Head: Normocephalic and atraumatic.  Right Ear: External ear normal.  Left Ear: External ear normal.  Nose: Nose normal.  Mouth/Throat: Oropharynx is clear and moist.  Eyes: Conjunctivae and EOM are normal. Pupils are equal, round, and reactive to light.  Neck: Normal range of motion. Neck supple. No JVD present. No tracheal deviation present. No thyromegaly present.  Cardiovascular: Normal rate, regular rhythm, normal heart sounds and intact distal pulses.   No murmur heard. Pulmonary/Chest: Effort normal and breath sounds normal. She has no wheezes. She exhibits no tenderness.  Abdominal: Soft. Bowel  sounds are normal.  Musculoskeletal: Normal range of motion. She exhibits no edema and no tenderness.  Lymphadenopathy:    She has no cervical adenopathy.  Neurological: She is alert and oriented to person, place, and time. She has normal reflexes. No cranial nerve deficit.  Skin: Skin is warm and dry. She is not diaphoretic.  Psychiatric: She has a normal mood and affect. Her behavior is normal.       Past Medical History  Diagnosis Date  . Allergy   . GERD (gastroesophageal reflux disease)   . Thyroid disease   . Fibromyalgia   . Headache   . Fibromyalgia   . Urinary incontinence    Past Surgical History  Procedure Date  . Abdominal hysterectomy   . Total abdominal hysterectomy 1983  . Colonoscopy 01/27/2004  . Total knee arthroplasty 2008/2011    both knees  . Rectocele repair   . Rotator cuff repair     reports that she has never smoked. She does not have any smokeless tobacco history on file. She reports that she does not drink alcohol or use illicit drugs. family history includes Colon cancer in an unspecified family member; Dementia in her mother; and Irritable bowel syndrome in an unspecified family member. No Known Allergies     Assessment & Plan:  Titrate prednisone to 5 mg Agree with eliminations of sleeping pills Treat AK in ear. Checking thyroid today Neuopathy improved with b12 change to b12 dots

## 2010-09-07 NOTE — Assessment & Plan Note (Signed)
cryotherapy  Informed consent was obtained in the lesion was treated for 60 seconds of liquid nitrogen application the patient tolerated the procedure well as procedural care was discussed with the patient and instructions should the lesion reappears contact our office immediately

## 2010-09-08 ENCOUNTER — Telehealth: Payer: Self-pay | Admitting: *Deleted

## 2010-09-08 NOTE — Telephone Encounter (Signed)
Per drjenkins- wait until ear heal before going for a biopsy

## 2010-09-08 NOTE — Telephone Encounter (Signed)
Dr. Ellery Plunk is not practicing anymore, and Eugenie wants to know if Dr. Lovell Sheehan can take care of her ear or if he needs to refer her somewhere else.

## 2010-09-08 NOTE — Telephone Encounter (Signed)
Pt would like to know if she should go ahead and schedule with Dr Ellery Plunk for her ear biopsy or wait until her 3 month appt with Dr. Lovell Sheehan.

## 2010-09-08 NOTE — Telephone Encounter (Signed)
Notified pt. 

## 2010-09-09 NOTE — Telephone Encounter (Signed)
First we need to see if the lesion will heal after the cryotherapy if the lesion does not heal we'll refer her to a dermatological surgeon

## 2010-09-09 NOTE — Telephone Encounter (Signed)
Notified pt. 

## 2010-09-15 ENCOUNTER — Encounter: Payer: Self-pay | Admitting: Internal Medicine

## 2010-09-17 ENCOUNTER — Ambulatory Visit: Payer: Self-pay | Admitting: Internal Medicine

## 2010-09-17 ENCOUNTER — Telehealth: Payer: Self-pay | Admitting: Internal Medicine

## 2010-09-17 NOTE — Telephone Encounter (Signed)
lmomtcb x1 

## 2010-09-18 NOTE — Telephone Encounter (Signed)
Spoke with pt.  She states that she has been taking allergy inj for months now and her nasal congestion is no better at all.  She states that she is using saline nasal rinses daily also.  She tried allegra x 1 month and this also did not help.  She wonders what else she can try and if she should be have repeat skin test done.  She states that after her last skin test she developed large black "moles" on her arms that lasted for 1 month. They have now resolved.  Please advise thanks!

## 2010-09-23 ENCOUNTER — Telehealth: Payer: Self-pay | Admitting: *Deleted

## 2010-09-23 DIAGNOSIS — H9390 Unspecified disorder of ear, unspecified ear: Secondary | ICD-10-CM

## 2010-09-23 NOTE — Telephone Encounter (Signed)
Pt wants to know if Dr. Lovell Sheehan wants to look at her ear and see what he thinks or send her to a new Dermatologist?

## 2010-09-23 NOTE — Telephone Encounter (Signed)
May go to derm per dr Lovell Sheehan

## 2010-09-23 NOTE — Telephone Encounter (Signed)
Notified pt. 

## 2010-09-23 NOTE — Telephone Encounter (Signed)
I reviewed some previous notes. I would like to review this with her at next visit. If possible, please bring her back off antihistamines and with time allowed for [place] to consider retesting on that visit if appropriate. Routine scheduling.

## 2010-09-24 NOTE — Telephone Encounter (Signed)
Spoke with patient-she is aware that we will do ALT on 10-30-10 and per CY okay to have allergy lab fill allergy vaccine to last until her visit. I spoke with Dimas Millin in allergy lab and she will mix and mail vaccine tomorrow.

## 2010-09-24 NOTE — Telephone Encounter (Signed)
I spoke with CDY-he is okay with patient coming in on 10-30-10 at 1pm to have allergy testing with her ROV. I have left a message for the patient to call me back so I can go over testing protocol and recs from CDY with her.

## 2010-09-24 NOTE — Telephone Encounter (Signed)
Patient phoned stated she was returning [name]'s call she can be reached at  [phone].[name]

## 2010-09-25 ENCOUNTER — Ambulatory Visit (INDEPENDENT_AMBULATORY_CARE_PROVIDER_SITE_OTHER): Payer: Medicare Other

## 2010-09-25 DIAGNOSIS — J309 Allergic rhinitis, unspecified: Secondary | ICD-10-CM

## 2010-09-29 NOTE — Op Note (Signed)
[name], [name]               ACCOUNT NO.:  [id number]   MEDICAL RECORD NO.:  [id number]          PATIENT TYPE:  AMB   LOCATION:  [facility]                         FACILITY:  [facility]   PHYSICIAN:  Jamison Neighbor, M.D.  DATE OF BIRTH:  August 18, 1940   DATE OF PROCEDURE:  10/06/2007  DATE OF DISCHARGE:                               OPERATIVE REPORT   PREOPERATIVE DIAGNOSIS:  Rectocele.   POSTOPERATIVE DIAGNOSIS:  Rectocele.   PROCEDURE:  Posterior repair including mesh.   SURGEON:  Jamison Neighbor, M.D.   ANESTHESIA:  General.   COMPLICATIONS:  None.   DRAINS:  None.   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  The patient is status post  hysterectomy and had a clear-cut anterior pair as well as what appears  to be a little bit of a posterior pair out towards the perineal body.  That area has held up fairly well but back towards the top of the vault,  there is significant prolapse on the posterior aspect.  The vault itself  was actually fairly well suspended.  There does not appear to be a clear-  cut enterocele.  The patient is now to undergo placement of mesh in  order to repair this significant grade 3 to 4 rectocele.  She  understands the risks and benefits of the procedure and gave full  informed consent.  We do note that we have done urodynamic testing but  no evidence of cystocele or incontinence noted.   PROCEDURE:  After successful induction of general anesthesia, the  patient was placed in the dorsal lithotomy position, prepped with  Betadine and draped in the usual sterile fashion.  A [place] ring was  utilized for the procedure.  Because the patient has an allergy, the  usual Penrose drain was discarded and the [place] ring was held in  place with gauze.  The posterior vaginal mucosa was infiltrated with  local anesthetic, an incision was made beginning out towards the  perineal body and extending  all the way to the top of the vault.  Dissection proceeded bilaterally so that flaps of mucosa were raised on  each side.  The rectocele was quite significant and very thinned out and  for that reason, some plication was done; not because it was felt that  this would give any strength, but in order to try and stabilize the  rectum so that the mesh repair would not raise much of a risk of  erosion.  After the entire dissection had been performed exposing the  spine on each side and the sacral spinalis ligament,  the imbrication in  the midline was performed with 2-0 Vicryl sutures.  The stab incisions  were then made 3 cm lateral and 3 cm below the rectum.  The trocar was  then passed up to the spine on each side and used to penetrate the  sacral spinalis ligament.  The mesh was then attached to the loop and  was pulled down to the appropriate position.  The mesh was trimmed and  was sutured down in several places with 2-0 Vicryl.  The support  appeared to be excellent and there was decent elevation of the vaginal  vault.  The mucosa did not require much in the way of trimming but the  edges were freshened slightly  to improve closure.  Hemostasis was  obtained with electrocautery.  The incision was then closed with a  running suture of 2-0 Vicryl.  Rectal examination was done before and  after placement of the mesh to ensure that there was no erosion and none  was noted.  There did appear to be excellent placement with good  support.  The patient really did not have much in the way of a  cystocele, though a very slight weakness towards posterior was  identified, but this was certainly not enough to require any additional  therapy.  The area was irrigated.  The patient's Foley catheter was left  to straight drainage.  The area was packed.  She tolerated the procedure  well and was taken to the recovery room in good condition.  She will be  kept for 23-hour observation and will be sent home  tomorrow with  antibiotic coverage.  She already is on adequate pain medication.  She  did not require additional medications.      Jamison Neighbor, M.D.  Electronically Signed     RJE/MEDQ  D:  10/06/2007  T:  10/06/2007  Job:  [id number]

## 2010-09-29 NOTE — Op Note (Signed)
[name], [name]               ACCOUNT NO.:  [id number]   MEDICAL RECORD NO.:  [id number]          PATIENT TYPE:  AMB   LOCATION:  DAY                          FACILITY:  [facility]   PHYSICIAN:  Angelia Mould. Derrell Lolling, M.D.DATE OF BIRTH:  Aug 01, 1940   DATE OF PROCEDURE:  04/02/2008  DATE OF DISCHARGE:  04/02/2008                               OPERATIVE REPORT   PREOPERATIVE DIAGNOSIS:  Biliary dyskinesia.   POSTOPERATIVE DIAGNOSIS:  Biliary dyskinesia.   OPERATION PERFORMED:  Laparoscopic cholecystectomy with intraoperative  cholangiogram.   SURGEON:  Dr. Claud Kelp.   ASSISTANT:  Dr. Consuello Bossier.   OPERATIVE INDICATIONS:  This is a 70 year old white female who has  fibromyalgia, osteoarthritis, and degenerative disk disease.  She takes  multiple medications including Provigil, morphine, prednisone, Feldene,  Reglan, among others.  She has a 3- to 4-year history of intermittent  episodes of right flank pain, nausea and vomiting without activating or  alleviating factors.  She does have intermittent diarrhea.  She has had  a gallbladder ultrasound which shows a normal gallbladder and no acute  or abnormal findings.  A nuclear medicine biliary scan recently  performed showed that the gallbladder does fill.  Ejection fraction is  depressed at 29%.  She was counseled as an outpatient.  She was offered  laparoscopic cholecystectomy with cholangiogram but was counseled that  her symptoms may or may not resolve, and if they do not, she will need a  more extensive GI workup.  It was her desire to go ahead with  cholecystectomy at this time.  She is brought to operating room  electively.   OPERATIVE FINDINGS:  Notable findings were that the gallbladder was  markedly distended and tense but was thin walled and was not acutely  inflamed.  There was no evidence of any stone disease within the  gallbladder or bile duct.  The biliary tree was notable for being  significantly dilated,  but there was no filling defect, and no  obstruction with good flow of contrast into the duodenum on the  cholangiogram.  The distal common duct seemed to taper smoothly and  appeared benign.  This was reviewed with Dr. Frazier Richards in radiology, and  he did not see any sign of any irregularities, stone or tumor.  As the  patient has normal liver function tests, we felt that this was not a  functional problem.  The liver, stomach, duodenum, small intestine,  large intestine were otherwise grossly normal to inspection.   OPERATIVE TECHNIQUE:  Following the induction of general endotracheal  anesthesia, the patient's abdomen was prepped and draped in a sterile  fashion.  The patient was identified as the correct patient and correct  procedure, and a surgical time-out was held.  Marcaine 0.5% with  epinephrine was used as local infiltration anesthetic.  Intravenous  antibiotics were given prior to the incision.  A curved transverse  incision was made at the lower rim of the umbilicus.  The fascia was  incised in the midline and the abdominal cavity entered under direct  vision.  A 10-mm Hassan trocar was inserted  and secured with a  pursestring suture of 0 Vicryl.  Pneumoperitoneum was created.  Video  camera was inserted with visualization and findings as described above.  An 11-mm trocar was placed in the subxiphoid region and two 5-mm trocars  were placed in the right midabdomen.  The gallbladder fundus was grasped  and elevated.  There were adhesions to the gallbladder that had to be  taken down.  These were thin, filmy, chronic adhesions.  We ultimately  cleaned off the infundibulum and retracted it laterally.  I then  dissected out the cystic duct and the cystic artery.  The cystic artery  was isolated as it went onto the wall of the gallbladder, secured with  multiple metal clips and divided.  I carefully cleaned off the cystic  duct and created a nice window behind the cystic duct.  A  cholangiogram  catheter was inserted in the cystic duct.  A cholangiogram was obtained  with findings as described above.  The cholangiogram catheter was  removed, the cystic duct secured with multiple metal clips and divided.  The gallbladder was dissected from the field with electrocautery, placed  in a specimen bag, and removed.  We made 1 small hole in the gallbladder  and spilled about 20 mL of clear green bile into the subhepatic space  and the subphrenic space.  This was all irrigated clear at the end of  the case.  After removing the gallbladder from the abdominal cavity, we  inspected the areas of dissection.  The irrigation fluid was completely  clear.  There was no bleeding and no bile leak whatsoever.  The trocars  were removed under direct vision.  There was no bleeding from trocar  sites.  The pneumoperitoneum was released.  The fascia at the umbilicus  was closed with 0 Vicryl sutures.  All of the skin incisions were closed  with subcuticular sutures of 4-0 Monocryl and Steri-Strips.  Clean  bandages were placed and the patient taken recovery room in stable  condition.  Estimated blood loss was about 20 mL.  Complications none.  Sponge, needle and instrument counts were correct.      Angelia Mould. Derrell Lolling, M.D.  Electronically Signed     HMI/MEDQ  D:  04/02/2008  T:  04/03/2008  Job:  [id number]   cc:   Stacie Glaze, MD  [address] [place]  [place] [address]

## 2010-09-29 NOTE — Discharge Summary (Signed)
[name], [name]               ACCOUNT NO.:  [id number]   MEDICAL RECORD NO.:  [id number]          PATIENT TYPE:  INP   LOCATION:  1602                         FACILITY:  [facility]   PHYSICIAN:  Madlyn Frankel. Charlann Boxer, M.D.  DATE OF BIRTH:  08/20/1940   DATE OF ADMISSION:  02/28/2007  DATE OF DISCHARGE:  03/02/2007                               DISCHARGE SUMMARY   ADMISSION DIAGNOSES:  1. Osteoarthritis.  2. Hypothyroidism.  3. Gastroesophageal reflux disease.  4. Fibromyalgia.   DISCHARGE DIAGNOSES:  1. Osteoarthritis.  2. Hypothyroidism.  3. Gastroesophageal reflux disease.  4. Fibromyalgia.   CONSULTATION:  None.   PROCEDURE:  A right total knee arthroplasty.  Surgeon:  Madlyn Frankel. Charlann Boxer,  M.D.  Assistant:  Yetta Glassman. Loreta Ave, [place].   HISTORY OF PRESENT ILLNESS:  A 70 year old female with persistent right  knee pain secondary to osteoarthritis, refractory to all conservative  treatments.  Diminished quality of life.   LABS:  Preadmission CBC:  Hematocrit 34.2.  discharged stable at 27.5.  Chemistries:  Preadmission within normal limits, at discharge no  abnormalities, within normal limits.  UA negative.   RADIOLOGY:  Chest two-view:  No active disease.   CARDIOLOGY:  EKG:  Normal sinus rhythm.   HOSPITAL COURSE:  The patient underwent a right total knee replacement  and tolerated the procedure well, was admitted to the orthopedic floor.  She was afebrile throughout her course of stay.  Dressing was changed on  a daily basis.  After postop day #1 there was no significant drainage  from the wound.  She remained neurovascularly intact of her right lower  extremity throughout her course of stay.  Quads would fire at time of  discharge.  DVT prophylaxis was started on postop day #1, which included  Lovenox.  She was PT/OT weightbearing as tolerated.  She progressed  nicely and after just previous 2 days she was stable, improved, and  ready to be discharged home.   DISCHARGE  DISPOSITION:  Discharged home with home health care PT in  stable and improved condition.  Weightbearing as tolerated with the use  of rolling walker.   DISCHARGE PHYSICAL THERAPY:  Goals of physical therapy will be to  weightbearing as tolerated, the use of a rolling walker, transitioning  to a cane.  Goals of range of motion will be 0-100 at 2 weeks, 0-120 in  6 weeks.   DISCHARGE DIET:  Regular.   DISCHARGE WOUND CARE:  Keep dry.   DISCHARGE FOLLOW-UP:  With Dr. Charlann Boxer, [phone], in 10-14 days.   DISCHARGE MEDICATIONS:  1. Lovenox 40 mg subcu q.24h. x12 days.  2. Robaxin 500 mg one p.o. q.6h. p.r.n. muscle spasm.  3. Iron 325 mg one p.o. t.i.d. x2 weeks.  4. Enteric-coated aspirin 325 mg one p.o. daily x4 weeks after Lovenox      completed.  5. Colace 100 mg p.o. b.i.d. constipation.  6. MiraLax 17 g p.o. daily.  7. Oxycodone 5 mg one to three p.o. q.3-4h. p.r.n. pain.  8. Provigil 200 mg p.o. b.i.d.  9. Morphine 30 mg p.o. b.i.d.  10.Levothroid  0.125 mg q.a.m.  11.Prednisone 1 mg 5 tablets q.a.m.  12.Piroxicam 20 mg p.o. each afternoon.  13.Zantac 300 mg p.o. p.r.n.  14.Centrum Silver daily.  15.Ocuvite daily.  16.Vitamin B3 daily.  17.Vitamin B12 daily.  18.Correctol q.h.s.  19.Anacin 400 mg p.o. b.i.d.  20.Reglan 5 mg p.r.n.  21.Imitrex 0.5 mL injection as needed p.r.n.  22.Ambien 10 mg p.o. q.h.s.  23.Astelin two  puffs each nostril daily.     ______________________________  Yetta Glassman [address]oreta [address], [place]      Madlyn Frankel. Charlann Boxer, M.D.  Electronically Signed    BLM/MEDQ  D:  03/14/2007  T:  03/14/2007  Job:  [id number]

## 2010-09-29 NOTE — Op Note (Signed)
[name], [name]               ACCOUNT NO.:  [id number]   MEDICAL RECORD NO.:  [id number]          PATIENT TYPE:  INP   LOCATION:  0006                         FACILITY:  [facility]   PHYSICIAN:  Madlyn Frankel. Charlann Boxer, M.D.  DATE OF BIRTH:  June 29, 1940   DATE OF PROCEDURE:  02/28/2007  DATE OF DISCHARGE:                               OPERATIVE REPORT   PREOPERATIVE DIAGNOSIS:  Right knee osteoarthritis.   POSTOPERATIVE DIAGNOSIS:  Right knee osteoarthritis.   PROCEDURE:  Right total knee replacement.   COMPONENTS USED:  DePuy rotating platform posterior stabilized knee  system, size 3 femur, 2.5 tibia, 15 mm insert, 38 patellar button.   SURGEON:  Madlyn Frankel. Charlann Boxer, M.D.   ASSISTANT:  Dwyane Luo.   ANESTHESIA:  General plus regional block.   COMPLICATIONS:  None.   TOURNIQUET TIME:  Combined for 44 minutes at 250 mmHg but it was done in  segments due to venous tourniquet type position after the tourniquet was  up for 20 minutes.   DRAINS:  x1 medium Hemovac.   COMPLICATIONS:  None.   INDICATIONS FOR PROCEDURE:  [name] is a 70 year old female who has  been followed in the office for end-stage right knee osteoarthritis  complicated by fibromyalgia and chronic pain.  She failed conservative  measures and despite some hesitation and reluctance to have the knee  surgery, she understood that her quality of life diminished due to this  pain.  We reviewed the risks of infection, DVT, component failure, pain  postoperatively as well as expectations of therapy.  Consent was  obtained.   PROCEDURE IN DETAIL:  The patient was brought to operative theater.  Once adequate anesthesia preoperative antibiotics administered the  patient was positioned supine.  Proximal thigh tourniquet placed on the  right thigh.  The right lower extremity prescrubbed and prepped and  draped in sterile fashion.  Midline incision was made followed by median  parapatellar arthrotomy with patellar subluxation.   Following initial  debridement, attention was directed patella precut measure was 23 mm.  I  resected down to 14 mm using 38 patella button.  This point a metal shim  was placed into the patella to protect it from retractors.  Attention  was now directed femur. Femoral canal was opened with drill,  irrigated  to prevent fat emboli.  Intramedullary rod was passed in femur and 5  degrees of valgus, I resected 10 mm bone off distal femur, sized the  femur to be size 3 and slightly between a size 3 and 4 in the anterior-  posterior dimension.  Size 3 cutting block was positioned, was  perpendicular to [employer], based on the posterior condylar axis.  Anterior-posterior and chamfer cuts were thus made.  Trochlear box cut  was made off the lateral aspect of the femur, attention was now directed  to the tibia, tibial subluxation meniscectomies were carried out.  I  then used an extramedullary device to cut 10 mm of bone off of the  lateral proximal tibia.  With this cut there was definitely more than  necessary for extension gradient  of 10 mm, pins removed.  It was at this  point that I noted more venous, tourniquet was let down.  Tourniquet was  let down, attention was now directed final preparation of tibia.  Oriented through the medial tubercle, I checked with an alignment rod,  was happy with the cut in both AP and lateral planes.  I drilled and  keel punched the tibia and performed trial reduction.  I went ahead and  used 15 mm insert because it came out, the knee came to full extension,  stable ligaments medially and laterally from extension to flexion.  The  patella tracked without application pressure to the center of the  trochlea. At this point all trial components removed.  I irrigated knee  with normal saline solution, injected the knee with 0.25% Marcaine with  epinephrine and 1 mL of Toradol.  The leg was re-exsanguinated and  tourniquet elevated.  Final components removed,  cement was mixed on the  back table and the components were cemented in position with tibia first  and femur and patella.  The knee was brought to extension and 15 mm  trial insert and extruded cement removed.  Once cement had cured,  excessive cement was removed throughout the knee.  Once I was satisfied  that I was unable to visualize any further cement and debris or  potential complicating issues, I placed the final 50 mm insert, 5 mL of  FloSeal was injected in the knee the medial, posterior, and  lateral  aspect of the knee.   At this point medium Hemovac drain was placed.  Tourniquet was let down  after that final 24 minutes.  The extensor mechanism was reapproximated  in flexion with #1 Vicryl, remainder of wound was closed with 2-0 Vicryl  and running 4-0 Monocryl.  The patient's knee was cleaned, dried,  dressed sterilely with Steri-Strips, dressing sponges, and bulky wrap.      Madlyn Frankel Charlann Boxer, M.D.  Electronically Signed     MDO/MEDQ  D:  02/28/2007  T:  03/01/2007  Job:  [id number]

## 2010-09-29 NOTE — H&P (Signed)
[name], [name]               ACCOUNT NO.:  [id number]   MEDICAL RECORD NO.:  [id number]          PATIENT TYPE:  INP   LOCATION:  NA                           FACILITY:  [facility]   PHYSICIAN:  Madlyn Frankel. Charlann Boxer, M.D.  DATE OF BIRTH:  [birth year]   DATE OF ADMISSION:  02/28/2007  DATE OF DISCHARGE:                              HISTORY & PHYSICAL   PROCEDURE:  Right total knee arthroplasty.   CHIEF COMPLAINTS:  Right knee pain.   HISTORY OF PRESENT ILLNESS:  A 70 year old female with a history of  persistent, progressive right knee pain secondary to osteoarthritis.  It  has been refractory to all conservative treatments.  She is  presurgically assessed by Dr. Darryll Capers.   PAST MEDICAL HISTORY:  1. Osteoarthritis.  2. Hyperthyroidism.  3. GERD.  4. Fibromyalgia.   PAST SURGICAL HISTORY:  1. Lumpectomy from breast.  2. Hysterectomy and bladder tack, 1984.  3. Right knee arthroscopic surgery in 1986.  4. Radial keratotomy in both eyes in 1983 and 1984.   FAMILY HISTORY:  Heart disease, cancer, stroke, arthritis.   SOCIAL HISTORY:  Patient married.  Primary caregiver after surgery will  be husband.   DRUG ALLERGIES:  Question to [name].   MEDICATIONS:  1. Hydrocodone 7.5/325 mg p.r.n., usually 1-1/2 twice daily.  2. Provigil 200 mg p.o. b.i.d.  3. Morphine sulfate ER 30 mg one p.o. q.a.m., one p.o. q.p.m.  4. Levothroid 0.125 mg one p.o. daily.  5. Prednisone 5 mg p.o. daily.  6. Piroxicam 20 mg one p.o. daily.  7. Zantac 300 mg one p.o. daily.  8. Centrum Silver one daily.  9. Ocuvite Adult 50+ one daily.  10.Vitamin B3 400 units daily.  11.Vitamin B12 injection monthly.  12.Correctol two to three per day.  13.Anacin 500 mg two p.o. daily.  14.Reglan 5 mg p.r.n.  15.Imitrex 0.5 mL injection p.r.n.  16.Ambien 10 mg one p.o. q.h.s. p.r.n.  17.Astelin nasal spray two sprays each nostril q.a.m., q.p.m.   REVIEW OF SYSTEMS:  MUSCULOSKELETAL:  Recent right shoulder  pain related  to impingement syndrome and rotator cuff tendinitis with recent  cortisone injection.  Otherwise see HPI.   PHYSICAL EXAM:  Pulse 76, respirations 18, blood pressure 134/84.  GENERAL:  She is awake, alert and oriented, well-developed, well-  nourished, no acute distress.  NECK:  Supple.  No carotid bruits.  CHEST/LUNGS:  Clear to auscultation bilaterally.  BREASTS:  Deferred.  HEART:  Regular rate and rhythm without murmurs.  ABDOMEN:  Soft, nontender, nondistended, bowel sounds present.  GENITOURINARY:  Deferred.  EXTREMITIES:  Right knee has mainly medial -sided tenderness, still  comes out to full extension with flexion back to 120+.  Skin: No  cellulitis.  Dorsalis pedis pulse positive.  NEUROLOGIC:  Intact distal sensibilities.   Labs, EKG, chest x-ray all done in presurgical testing, results pending.   IMPRESSION:  1. Osteoarthritis.  2. Hypothyroidism.  3. Gastroesophageal reflux disease.  4. Fibromyalgia.   PLAN OF ACTION:  Right total knee arthroplasty at [facility] February 28, 2007, by surgeon Dr. Durene Romans.  Risks and complications were  discussed.   Postop medications including Lovenox Robaxin, iron, aspirin, MiraLax,  Colace were provided at time of history and physical.  Pain medicines  will be provided at time of surgery.     ______________________________  [name], [place]      Madlyn Frankel. Charlann Boxer, M.D.  Electronically Signed    BLM/MEDQ  D:  02/24/2007  T:  02/25/2007  Job:  [id number]   cc:   Stacie Glaze, MD  [address] [place]  [place] [address]

## 2010-09-30 ENCOUNTER — Other Ambulatory Visit: Payer: Self-pay | Admitting: *Deleted

## 2010-09-30 MED ORDER — LEVOTHYROXINE SODIUM 150 MCG PO TABS: 150.0000 ug | ORAL_TABLET | Freq: Every day | ORAL | Status: DC

## 2010-10-14 ENCOUNTER — Telehealth: Payer: Self-pay | Admitting: *Deleted

## 2010-10-14 NOTE — Telephone Encounter (Signed)
Ask ortho about this I am concerned about falls with increased medications

## 2010-10-14 NOTE — Telephone Encounter (Signed)
Please discuss this with the orthopedist I am concerned about falls

## 2010-10-14 NOTE — Telephone Encounter (Signed)
Pt fx foot and the boot she is wearing is making her back hurt worse.  Is going to need  More pain pills, and thinks she needs more MRIs on back as it is getting worse.

## 2010-10-15 NOTE — Telephone Encounter (Signed)
LMTCB

## 2010-10-15 NOTE — Telephone Encounter (Signed)
Pt given Dr. Jenkin's recommendations. 

## 2010-10-22 ENCOUNTER — Other Ambulatory Visit: Payer: Self-pay | Admitting: Internal Medicine

## 2010-10-30 ENCOUNTER — Other Ambulatory Visit: Payer: Self-pay | Admitting: *Deleted

## 2010-10-30 ENCOUNTER — Encounter: Payer: Self-pay | Admitting: Internal Medicine

## 2010-10-30 ENCOUNTER — Ambulatory Visit (INDEPENDENT_AMBULATORY_CARE_PROVIDER_SITE_OTHER): Payer: Medicare Other | Admitting: Internal Medicine

## 2010-10-30 VITALS — BP 116/82 | HR 84 | Ht 63.0 in | Wt 140.0 lb

## 2010-10-30 DIAGNOSIS — J301 Allergic rhinitis due to pollen: Secondary | ICD-10-CM

## 2010-10-30 DIAGNOSIS — J309 Allergic rhinitis, unspecified: Secondary | ICD-10-CM

## 2010-10-30 NOTE — Patient Instructions (Addendum)
   I will have the allergy lab remix and restart your allergy shots, adjusted to fit today's skin tests  Try sample nasonex nasal steroid spray- 1 or 2 puffs each nostril once every day at bedtime. If it helps, we can send script.   Marland Kitchen

## 2010-10-30 NOTE — Assessment & Plan Note (Addendum)
She wants to try remixing and restarting allergy shots based on today's testing. We are also going to try Nasonex. Educated to minimize Afrin. Discussed environmental precautions.  Discuss allergic vs non allergic symptoms.

## 2010-10-30 NOTE — Progress Notes (Signed)
  Subjective:    Patient ID: [name], female    DOB: 25-Nov-1940, 70 y.o.   MRN: [id number]  HPI  10/30/10- 70 yoF followed for allergic rhinitis Last here December 15, 2009. Coming today for allergy skin testing. Has continued allergy vaccine at 1:50. from initial testing in 2011.She was having some local reactions to allergy vaccine. Last injection was 10/22/10- separate vials until the last 2 weeks.  Main c/o is nasal congestion which seems exhausting. Uses a decongestant nose spray each night at bedtime so she won't snore. Otherwise uses saline. Denies wheeze or chest tightness.  [place]. Doesn't feel well taking antihistamines.  Skin test-  Numerous mild reactions- some differences with more included compared with 2011 testing.  Review of Systems Constitutional:   No weight loss, night sweats,  Fevers, chills, fatigue, lassitude. HEENT:   See HPI  CV:  No chest pain,  Orthopnea, PND, swelling in lower extremities, anasarca, dizziness, palpitations  GI  No heartburn, indigestion, abdominal pain, nausea, vomiting, diarrhea, change in bowel habits, loss of appetite  Resp: No shortness of breath with exertion or at rest.  No excess mucus, no productive cough,  No non-productive cough,  No coughing up of blood.  No change in color of mucus.  No wheezing.   Skin: no rash or lesions.  GU: no dysuria, change in color of urine, no urgency or frequency.  No flank pain.  MS:  No joint pain or swelling.  No decreased range of motion.  No back pain.  Psych:  No change in mood or affect. No depression or anxiety.  No memory loss.      Objective:   Physical Exam General- Alert, Oriented, Affect-appropriate, Distress- none acute  Skin- rash-none, lesions- none, excoriation- none  Lymphadenopathy- none  Head- atraumatic  Eyes- Gross vision intact, PERRLA, conjunctivae clear secretions  Ears- Hearing, canals, Tm - normal  Nose- Clear, Mild septal dev, No- mucus, polyps,  erosion, perforation   Throat- Mallampati II , mucosa clear , drainage- none, tonsils- atrophic  Neck- flexible , trachea midline, no stridor , thyroid nl, carotid no bruit  Chest - symmetrical excursion , unlabored     Heart/CV- RRR , no murmur , no gallop  , no rub, nl s1 s2                     - JVD- none , edema- none, stasis changes- none, varices- none     Lung- clear to P&A, wheeze- none, cough- none , dullness-none, rub- none     Chest wall-   Abd- tender-no, distended-no, bowel sounds-present, HSM- no  Br/ Gen/ Rectal- Not done, not indicated  Extrem- cyanosis- none, clubbing, none, atrophy- none, strength- nl.  Right leg in walking boot after injury.  Neuro- grossly intact to observation     Assessment & Plan:

## 2010-11-06 ENCOUNTER — Ambulatory Visit (INDEPENDENT_AMBULATORY_CARE_PROVIDER_SITE_OTHER): Payer: Medicare Other | Admitting: Internal Medicine

## 2010-11-06 ENCOUNTER — Encounter: Payer: Self-pay | Admitting: Internal Medicine

## 2010-11-06 DIAGNOSIS — M545 Low back pain, unspecified: Secondary | ICD-10-CM | POA: Insufficient documentation

## 2010-11-06 DIAGNOSIS — K219 Gastro-esophageal reflux disease without esophagitis: Secondary | ICD-10-CM

## 2010-11-06 DIAGNOSIS — IMO0001 Reserved for inherently not codable concepts without codable children: Secondary | ICD-10-CM

## 2010-11-06 DIAGNOSIS — E039 Hypothyroidism, unspecified: Secondary | ICD-10-CM

## 2010-11-06 DIAGNOSIS — M5412 Radiculopathy, cervical region: Secondary | ICD-10-CM

## 2010-11-06 MED ORDER — HYDROCODONE-ACETAMINOPHEN 5-325 MG PO TABS: 1.0000 | ORAL_TABLET | Freq: Four times a day (QID) | ORAL | Status: DC | PRN

## 2010-11-06 MED ORDER — HYDROCODONE-ACETAMINOPHEN 10-500 MG PO TABS: 1.0000 | ORAL_TABLET | Freq: Four times a day (QID) | ORAL | Status: DC | PRN

## 2010-11-09 ENCOUNTER — Telehealth: Payer: Self-pay | Admitting: *Deleted

## 2010-11-10 NOTE — Telephone Encounter (Signed)
[name] can you find our skin test sheet from the 6/15 testing- I can't. Maybe it hasn't been scanned yet??

## 2010-11-11 ENCOUNTER — Encounter: Payer: Self-pay | Admitting: Internal Medicine

## 2010-11-11 NOTE — Telephone Encounter (Signed)
CY, this has been scanned in the computer. Please look under Media tab. Thanks.

## 2010-11-12 NOTE — Telephone Encounter (Signed)
I have given allergy lab a script for new vaccine mix, to build as directed, GO

## 2010-11-17 NOTE — Telephone Encounter (Signed)
I forgot to check my in basket yesterday & today til now. I mailed Mrs. Weidinger's new vac.(1:50,000) Monday as she requested. The correct instructions are in there I checked two or three times to make sure. I called her just now,her vac. came in the mail today.

## 2010-12-07 ENCOUNTER — Ambulatory Visit: Payer: PRIVATE HEALTH INSURANCE | Admitting: Internal Medicine

## 2010-12-11 ENCOUNTER — Ambulatory Visit (INDEPENDENT_AMBULATORY_CARE_PROVIDER_SITE_OTHER): Payer: Medicare Other | Admitting: Internal Medicine

## 2010-12-11 ENCOUNTER — Encounter: Payer: Self-pay | Admitting: Internal Medicine

## 2010-12-11 VITALS — BP 130/80 | HR 72 | Temp 98.2°F | Resp 16 | Ht 63.0 in | Wt 146.0 lb

## 2010-12-11 DIAGNOSIS — M5416 Radiculopathy, lumbar region: Secondary | ICD-10-CM

## 2010-12-11 DIAGNOSIS — M5412 Radiculopathy, cervical region: Secondary | ICD-10-CM

## 2010-12-11 DIAGNOSIS — M818 Other osteoporosis without current pathological fracture: Secondary | ICD-10-CM

## 2010-12-11 DIAGNOSIS — IMO0002 Reserved for concepts with insufficient information to code with codable children: Secondary | ICD-10-CM

## 2010-12-11 DIAGNOSIS — M797 Fibromyalgia: Secondary | ICD-10-CM

## 2010-12-11 DIAGNOSIS — IMO0001 Reserved for inherently not codable concepts without codable children: Secondary | ICD-10-CM

## 2010-12-11 LAB — CALCIUM: Calcium: 9.4 mg/dL (ref 8.4–10.5)

## 2010-12-11 MED ORDER — TROSPIUM CHLORIDE ER 60 MG PO CP24: 60.0000 mg | ORAL_CAPSULE | Freq: Every day | ORAL | Status: DC

## 2010-12-11 MED ORDER — PREDNISONE 5 MG PO TABS: 5.0000 mg | ORAL_TABLET | Freq: Every day | ORAL | Status: DC

## 2010-12-11 NOTE — Progress Notes (Signed)
Subjective:    Patient ID: [name], female    DOB: 06/21/1940, 70 y.o.   MRN: [id number]  HPI This is a complicated patient who brings with her the 3 MRI, orthopedist an MRI of her neck her thoracic region and her lumbar region.  She has significant pain and pain management is her primary complaint. This is complicated by the fact that she has fibromyalgia as well as known arthritic and disc degeneration of her spine at multiple levels.  It was apparent from the review of her MRI that she has significant facet arthritis and spurring of multiple regions in the neck and back and significant degenerative disc disease at the L45 and L5-S1 levels.  She has been referred to a orthopedic spinal surgeon for consideration for surgical intervention and she had multiple  Questions about the potential complications of her surgery.  Her comorbid conditions include osteoporosis her last bone density in 2010 showed osteoporosis of the spine with a met score of -2.9 she has been on calcium and vitamin D but had discontinued her vitamin D because she was "in the sun" for last vitamin D level was approximately 35 . She also brings with her a list of medications that she film might be helpful for her fibromyalgia including magnesium and nutrition shakes    Review of Systems  Constitutional: Negative for activity change, appetite change and fatigue.  HENT: Negative for ear pain, congestion, neck pain, postnasal drip and sinus pressure.   Eyes: Negative for redness and visual disturbance.  Respiratory: Negative for cough, shortness of breath and wheezing.   Gastrointestinal: Negative for abdominal pain and abdominal distention.  Genitourinary: Negative for dysuria, frequency and menstrual problem.  Musculoskeletal: Positive for myalgias, back pain, joint swelling and arthralgias.  Skin: Negative for rash and wound.  Neurological: Positive for weakness. Negative for dizziness and headaches.    Hematological: Negative for adenopathy. Does not bruise/bleed easily.  Psychiatric/Behavioral: Negative for sleep disturbance and decreased concentration.   Past Medical History  Diagnosis Date  . Allergy   . GERD (gastroesophageal reflux disease)   . Thyroid disease   . Fibromyalgia   . Headache   . Fibromyalgia   . Urinary incontinence   . Degenerative disk disease    Past Surgical History  Procedure Date  . Abdominal hysterectomy   . Total abdominal hysterectomy 1983  . Colonoscopy 01/27/2004  . Total knee arthroplasty 2008/2011    both knees  . Rectocele repair   . Rotator cuff repair     reports that she has never smoked. She does not have any smokeless tobacco history on file. She reports that she does not drink alcohol or use illicit drugs. family history includes Colon cancer in an unspecified family member; Dementia in her mother; and Irritable bowel syndrome in an unspecified family member. No Known Allergies     Objective:   Physical Exam  Constitutional: She is oriented to person, place, and time. She appears well-developed and well-nourished. No distress.  HENT:  Head: Normocephalic and atraumatic.  Right Ear: External ear normal.  Left Ear: External ear normal.  Nose: Nose normal.  Mouth/Throat: Oropharynx is clear and moist.  Eyes: Conjunctivae and EOM are normal. Pupils are equal, round, and reactive to light.  Neck: Normal range of motion. Neck supple. No JVD present. No tracheal deviation present. No thyromegaly present.  Cardiovascular: Normal rate, regular rhythm, normal heart sounds and intact distal pulses.   No murmur heard. Pulmonary/Chest: Effort normal and  breath sounds normal. She has no wheezes. She exhibits no tenderness.  Abdominal: Soft. Bowel sounds are normal.  Musculoskeletal: Normal range of motion. She exhibits edema and tenderness.  Lymphadenopathy:    She has no cervical adenopathy.  Neurological: She is alert and oriented to  person, place, and time. She has normal reflexes. No cranial nerve deficit.  Skin: Skin is warm and dry. She is not diaphoretic.  Psychiatric: She has a normal mood and affect. Her behavior is normal.          Assessment & Plan:  45 minutes were spent face-to-face with this patient of which over half was in counseling.  First we discussed her fibromyalgia condition and at the use of magnesium she has researched a magnesium salt magnesium maleate and has been told that this would be the best form of magnesium for her we find no contraindication for the use of that and moderate doses and suggested limiting her dose to less than 400 mg a day.  She is due for bone density T2 osteoporosis of her spine and if surgery is contemplated we discussed the need to correct some of the osteoporosis with a newer agents such as prolia or Forteo injections should she had previously failed the use of bisphosphonate.  We reviewed in detail and scanned into the chart her x-rays including her C-spine MRI of her lumbar spine MRI in her thoracic spine MRI we discussed possible surgical interventions and concluded that a referral to the spine surgeon was appropriate and necessary.  She had questions about incidental findings on the MRIs such as mild white matter disease that was actually less than one might expect for her age we discussed these the potential impact on her other risks.

## 2010-12-11 NOTE — Patient Instructions (Addendum)
We are checking a vitamin d and a calcium level We will order a bone density to see if prolia or forteo  will help

## 2010-12-18 ENCOUNTER — Telehealth: Payer: Self-pay | Admitting: Internal Medicine

## 2010-12-18 NOTE — Telephone Encounter (Signed)
Please advise 

## 2010-12-18 NOTE — Telephone Encounter (Signed)
Requesting lab results and any rxs associated with the report. Thanks.

## 2010-12-21 NOTE — Telephone Encounter (Signed)
LMTCB

## 2010-12-21 NOTE — Telephone Encounter (Signed)
The calcium and vitamin D were within the normal range per the current recommendations. I would not add additional calcium or additional vitamin D to what she is taking

## 2010-12-22 NOTE — Telephone Encounter (Signed)
Pt states Dr. Lovell Sheehan was going to schedule a bone density for her.  She does not remember where she had her last one.

## 2010-12-22 NOTE — Telephone Encounter (Signed)
Debbie after talking to pt please send to scheduler to call and schedule bone density

## 2010-12-22 NOTE — Telephone Encounter (Signed)
Please schedule bone density and call pt.

## 2010-12-22 NOTE — Telephone Encounter (Signed)
Pt has a bone density scheduled  for 12-23-2010 @3  pm.

## 2010-12-29 ENCOUNTER — Ambulatory Visit (INDEPENDENT_AMBULATORY_CARE_PROVIDER_SITE_OTHER): Payer: Medicare Other

## 2010-12-29 DIAGNOSIS — J309 Allergic rhinitis, unspecified: Secondary | ICD-10-CM

## 2010-12-31 ENCOUNTER — Ambulatory Visit (INDEPENDENT_AMBULATORY_CARE_PROVIDER_SITE_OTHER)
Admission: RE | Admit: 2010-12-31 | Discharge: 2010-12-31 | Disposition: A | Discharge status: Home / Self Care | Payer: Medicare Other | Source: Ambulatory Visit

## 2010-12-31 DIAGNOSIS — M818 Other osteoporosis without current pathological fracture: Secondary | ICD-10-CM

## 2011-01-07 ENCOUNTER — Encounter: Payer: Self-pay | Admitting: Internal Medicine

## 2011-01-11 ENCOUNTER — Telehealth: Payer: Self-pay | Admitting: Internal Medicine

## 2011-01-11 NOTE — Telephone Encounter (Signed)
[name] scott aware no more vaccine to be mailed at this time pt will call when she is ready for another shipment

## 2011-01-11 NOTE — Telephone Encounter (Signed)
lmomtcb x1 

## 2011-01-11 NOTE — Telephone Encounter (Signed)
PATIENT RETURNED CALL  

## 2011-01-11 NOTE — Telephone Encounter (Signed)
Pt returned call.  Holly D Pryor ° °

## 2011-01-20 ENCOUNTER — Telehealth: Payer: Self-pay | Admitting: Internal Medicine

## 2011-01-20 NOTE — Telephone Encounter (Signed)
Pt requesting to be contacted about bone density scan she received on 12/31/10. Please contact

## 2011-01-20 NOTE — Telephone Encounter (Signed)
Results are in Epic

## 2011-01-21 NOTE — Telephone Encounter (Signed)
Pt aware.

## 2011-01-21 NOTE — Telephone Encounter (Signed)
Patient has worsening osteoporosis and would be a candidate for prolia Plan [name] returns we will pre-certify t this with her insurance to see if it's covered The antrum she should look up information about this drug so that she feels comfortable about its use it will be an injection twice a year

## 2011-01-22 ENCOUNTER — Other Ambulatory Visit: Payer: Self-pay | Admitting: Internal Medicine

## 2011-02-04 ENCOUNTER — Telehealth: Payer: Self-pay | Admitting: Internal Medicine

## 2011-02-04 NOTE — Telephone Encounter (Signed)
[facility] had a question re: pts prolia req rcvd from pcp. Pls call.

## 2011-02-05 NOTE — Telephone Encounter (Signed)
Faxed copy of insurance

## 2011-02-09 ENCOUNTER — Other Ambulatory Visit: Payer: Self-pay | Admitting: Neurosurgery

## 2011-02-09 DIAGNOSIS — M5124 Other intervertebral disc displacement, thoracic region: Secondary | ICD-10-CM

## 2011-02-10 ENCOUNTER — Encounter: Payer: Self-pay | Admitting: Internal Medicine

## 2011-02-10 ENCOUNTER — Ambulatory Visit (INDEPENDENT_AMBULATORY_CARE_PROVIDER_SITE_OTHER): Payer: Medicare Other | Admitting: Internal Medicine

## 2011-02-10 VITALS — BP 150/80 | HR 76 | Temp 98.2°F | Resp 16 | Ht 63.0 in | Wt 141.0 lb

## 2011-02-10 DIAGNOSIS — Z23 Encounter for immunization: Secondary | ICD-10-CM

## 2011-02-10 DIAGNOSIS — M797 Fibromyalgia: Secondary | ICD-10-CM

## 2011-02-10 DIAGNOSIS — IMO0001 Reserved for inherently not codable concepts without codable children: Secondary | ICD-10-CM

## 2011-02-10 DIAGNOSIS — M818 Other osteoporosis without current pathological fracture: Secondary | ICD-10-CM

## 2011-02-10 DIAGNOSIS — M810 Age-related osteoporosis without current pathological fracture: Secondary | ICD-10-CM

## 2011-02-10 DIAGNOSIS — M545 Low back pain: Secondary | ICD-10-CM

## 2011-02-10 DIAGNOSIS — E039 Hypothyroidism, unspecified: Secondary | ICD-10-CM

## 2011-02-10 LAB — POCT HEMOGLOBIN-HEMACUE
Hemoglobin: 12.3
Operator id: 280881

## 2011-02-10 MED ORDER — HYDROCODONE-ACETAMINOPHEN 10-500 MG PO TABS: 1.0000 | ORAL_TABLET | Freq: Four times a day (QID) | ORAL | Status: AC | PRN

## 2011-02-10 MED ORDER — PREDNISONE 5 MG PO TABS: 5.0000 mg | ORAL_TABLET | Freq: Every day | ORAL | Status: DC

## 2011-02-10 MED ORDER — DENOSUMAB 60 MG/ML ~~LOC~~ SOLN
60.0000 mg | Freq: Once | SUBCUTANEOUS | Status: AC
  Administered 2011-02-10: 60 mg via SUBCUTANEOUS

## 2011-02-10 MED ORDER — MODAFINIL 100 MG PO TABS: 100.0000 mg | ORAL_TABLET | Freq: Every day | ORAL | Status: AC

## 2011-02-10 NOTE — Progress Notes (Signed)
Subjective:    Patient ID: [name], female    DOB: December 09, 1940, 70 y.o.   MRN: [id number]  HPI As a 70 year old white female with a history of osteoporosis she presents today to discuss appropriate.  We spent 30 minutes patient placed discussing the side effects and benefits of prolia as well as the rationale for choosing this drug or other drugs.  Her husband was present and engaged in the discussion. Also discussed pain control and refilled her Norco     Review of Systems  Constitutional: Negative for activity change, appetite change and fatigue.  HENT: Negative for ear pain, congestion, neck pain, postnasal drip and sinus pressure.   Eyes: Negative for redness and visual disturbance.  Respiratory: Negative for cough, shortness of breath and wheezing.   Gastrointestinal: Negative for abdominal pain and abdominal distention.  Genitourinary: Negative for dysuria, frequency and menstrual problem.  Musculoskeletal: Negative for myalgias, joint swelling and arthralgias.  Skin: Negative for rash and wound.  Neurological: Negative for dizziness, weakness and headaches.  Hematological: Negative for adenopathy. Does not bruise/bleed easily.  Psychiatric/Behavioral: Negative for sleep disturbance and decreased concentration.   Past Medical History  Diagnosis Date  . Allergy   . GERD (gastroesophageal reflux disease)   . Thyroid disease   . Fibromyalgia   . Headache   . Fibromyalgia   . Urinary incontinence   . Degenerative disk disease    Past Surgical History  Procedure Date  . Abdominal hysterectomy   . Total abdominal hysterectomy 1983  . Colonoscopy 01/27/2004  . Total knee arthroplasty 2008/2011    both knees  . Rectocele repair   . Rotator cuff repair     reports that she has never smoked. She does not have any smokeless tobacco history on file. She reports that she does not drink alcohol or use illicit drugs. family history includes Colon cancer in an  unspecified family member; Dementia in her mother; and Irritable bowel syndrome in an unspecified family member. No Known Allergies     Objective:   Physical Exam  Nursing note and vitals reviewed. Constitutional: She is oriented to person, place, and time. She appears well-developed and well-nourished. No distress.  HENT:  Head: Normocephalic and atraumatic.  Right Ear: External ear normal.  Left Ear: External ear normal.  Nose: Nose normal.  Mouth/Throat: Oropharynx is clear and moist.  Eyes: Conjunctivae and EOM are normal. Pupils are equal, round, and reactive to light.  Neck: Normal range of motion. Neck supple. No JVD present. No tracheal deviation present. No thyromegaly present.  Cardiovascular: Normal rate, regular rhythm, normal heart sounds and intact distal pulses.   No murmur heard. Pulmonary/Chest: Effort normal and breath sounds normal. She has no wheezes. She exhibits no tenderness.  Abdominal: Soft. Bowel sounds are normal.  Musculoskeletal: Normal range of motion. She exhibits no edema and no tenderness.  Lymphadenopathy:    She has no cervical adenopathy.  Neurological: She is alert and oriented to person, place, and time. She has normal reflexes. No cranial nerve deficit.  Skin: Skin is warm and dry. She is not diaphoretic.  Psychiatric: She has a normal mood and affect. Her behavior is normal.          Assessment & Plan:  Discussed polio and agreed to do it properly it today we'll monitor carefully for side effects we discussed the potential side effects and benefits of this drug.  For pain control refilled medication with the current protocol.  She is stable  from the standpoint of all of her other problems

## 2011-02-10 NOTE — Patient Instructions (Signed)
Watch any rash within 24 hours

## 2011-02-10 NOTE — Progress Notes (Signed)
Addended by: Willy Eddy on: 02/10/2011 03:05 PM   Modules accepted: Orders

## 2011-02-11 ENCOUNTER — Telehealth: Payer: Self-pay | Admitting: *Deleted

## 2011-02-11 NOTE — Telephone Encounter (Signed)
She will have a calcium and vitamin D checked at her next office visit they were checked prior to her beginning her prolia

## 2011-02-11 NOTE — Telephone Encounter (Signed)
Also, is asking if she should be taking Calcium and Vitamin D????

## 2011-02-11 NOTE — Telephone Encounter (Signed)
Take one 600 calcium with 400 iu vit d daily  ( caltrate)

## 2011-02-11 NOTE — Telephone Encounter (Signed)
Pt. Is asking if she needs to have Calcium and Vitamin D rechecked, and if she should be taking any??

## 2011-02-11 NOTE — Telephone Encounter (Signed)
Notified pt. 

## 2011-02-15 ENCOUNTER — Ambulatory Visit
Admission: RE | Admit: 2011-02-15 | Discharge: 2011-02-15 | Disposition: A | Discharge status: Home / Self Care | Payer: Medicare Other | Source: Ambulatory Visit | Attending: Neurosurgery | Admitting: Neurosurgery

## 2011-02-15 DIAGNOSIS — M5124 Other intervertebral disc displacement, thoracic region: Secondary | ICD-10-CM

## 2011-02-16 LAB — URINALYSIS, ROUTINE W REFLEX MICROSCOPIC
Hgb urine dipstick: NEGATIVE
Nitrite: NEGATIVE
Specific Gravity, Urine: 1.006
Urobilinogen, UA: 0.2
pH: 7.5

## 2011-02-16 LAB — CBC
Hemoglobin: 11.5 — ABNORMAL LOW
RDW: 14.7
WBC: 4.1

## 2011-02-16 LAB — COMPREHENSIVE METABOLIC PANEL
ALT: 16
Albumin: 3.6
Alkaline Phosphatase: 65
Glucose, Bld: 106 — ABNORMAL HIGH
Potassium: 4
Sodium: 141
Total Protein: 5.7 — ABNORMAL LOW

## 2011-02-16 LAB — DIFFERENTIAL
Basophils Relative: 1
Eosinophils Absolute: 0.4
Monocytes Absolute: 0.3
Monocytes Relative: 8
Neutrophils Relative %: 46

## 2011-02-18 ENCOUNTER — Ambulatory Visit
Admission: RE | Admit: 2011-02-18 | Discharge: 2011-02-18 | Disposition: A | Discharge status: Home / Self Care | Payer: Medicare Other | Source: Ambulatory Visit | Attending: Neurosurgery | Admitting: Neurosurgery

## 2011-02-18 ENCOUNTER — Ambulatory Visit (INDEPENDENT_AMBULATORY_CARE_PROVIDER_SITE_OTHER): Payer: Medicare Other

## 2011-02-18 DIAGNOSIS — J309 Allergic rhinitis, unspecified: Secondary | ICD-10-CM

## 2011-02-18 DIAGNOSIS — M5412 Radiculopathy, cervical region: Secondary | ICD-10-CM

## 2011-02-18 DIAGNOSIS — M545 Low back pain, unspecified: Secondary | ICD-10-CM

## 2011-02-18 MED ORDER — IOHEXOL 300 MG/ML  SOLN
10.0000 mL | Freq: Once | INTRAMUSCULAR | Status: AC | PRN
  Administered 2011-02-18: 10 mL via INTRATHECAL

## 2011-02-18 MED ORDER — DIAZEPAM 5 MG PO TABS
5.0000 mg | ORAL_TABLET | Freq: Once | ORAL | Status: AC
  Administered 2011-02-18: 5 mg via ORAL

## 2011-02-18 NOTE — Progress Notes (Signed)
Patient states she has been off Imitrex for at least the past two days.  jkl

## 2011-02-18 NOTE — Patient Instructions (Signed)

## 2011-02-24 LAB — BASIC METABOLIC PANEL
CO2: 30
Calcium: 8.5
Chloride: 104
GFR calc Af Amer: >60
GFR calc non Af Amer: >60
Glucose, Bld: 107 — ABNORMAL HIGH
Potassium: 3.9
Sodium: 137
Sodium: 139

## 2011-02-24 LAB — CBC
HCT: 27.5 — ABNORMAL LOW
Hemoglobin: 8.7 — ABNORMAL LOW
Hemoglobin: 9.2 — ABNORMAL LOW
MCHC: 33.6
RBC: 2.94 — ABNORMAL LOW
RDW: 13.8

## 2011-02-25 LAB — APTT: aPTT: 24

## 2011-02-25 LAB — URINALYSIS, ROUTINE W REFLEX MICROSCOPIC
Bilirubin Urine: NEGATIVE
Hgb urine dipstick: NEGATIVE
Specific Gravity, Urine: 1.003 — ABNORMAL LOW
Urobilinogen, UA: 0.2

## 2011-02-25 LAB — COMPREHENSIVE METABOLIC PANEL
AST: 15
BUN: 11
CO2: 29
Calcium: 9.5
Creatinine, Ser: 0.68
GFR calc Af Amer: >60
GFR calc non Af Amer: >60
Glucose, Bld: 99

## 2011-02-25 LAB — PROTIME-INR
INR: 0.9
Prothrombin Time: 12.7

## 2011-02-25 LAB — ABO/RH: ABO/RH(D): A POS

## 2011-02-25 LAB — CBC
MCHC: 33.9
MCV: 87.3
RBC: 3.91

## 2011-02-25 LAB — TYPE AND SCREEN: Antibody Screen: NEGATIVE

## 2011-02-25 LAB — VITAMIN B12: Vitamin B-12: 591 (ref 211–911)

## 2011-03-04 ENCOUNTER — Telehealth: Payer: Self-pay | Admitting: Family Medicine

## 2011-03-04 NOTE — Telephone Encounter (Signed)
Per Misty Stanley at [facility], pt's Provigil has been denied. Please advise.

## 2011-03-04 NOTE — Telephone Encounter (Signed)
Talked with pt and she states insurance had already called her and told her it was denied because it has not been approved for fibromyalgia--pt states she took one, that she had, today and it really helped her a lot. Pt aware it will be Thursday before dr Lovell Sheehan returns to address message

## 2011-03-11 ENCOUNTER — Other Ambulatory Visit: Payer: Self-pay | Admitting: *Deleted

## 2011-03-11 MED ORDER — METHYLPHENIDATE HCL 5 MG PO TABS: 5.0000 mg | ORAL_TABLET | Freq: Two times a day (BID) | ORAL | Status: DC

## 2011-03-11 NOTE — Telephone Encounter (Signed)
Per [facility] can use methylin 5 mg- pt informed and wants to try it

## 2011-04-05 ENCOUNTER — Other Ambulatory Visit: Payer: Self-pay | Admitting: Internal Medicine

## 2011-04-05 MED ORDER — METHYLPHENIDATE HCL 5 MG PO TABS: 5.0000 mg | ORAL_TABLET | Freq: Two times a day (BID) | ORAL | Status: DC

## 2011-04-05 NOTE — Telephone Encounter (Signed)
Pt req refill of methylphenidate (METHYLIN) 5 MG tablet. Pls call when ready for pick up.   Pt wanted Dr Lovell Sheehan know that her mother died on 04-20-2011. Pt said that this medicine has really helped her cope with this.

## 2011-04-06 NOTE — Telephone Encounter (Signed)
Pt informed ready for pick up 

## 2011-04-24 ENCOUNTER — Other Ambulatory Visit: Payer: Self-pay | Admitting: Internal Medicine

## 2011-05-04 ENCOUNTER — Telehealth: Payer: Self-pay | Admitting: Internal Medicine

## 2011-05-04 MED ORDER — METHYLPHENIDATE HCL 5 MG PO TABS: 5.0000 mg | ORAL_TABLET | Freq: Two times a day (BID) | ORAL | Status: AC

## 2011-05-04 NOTE — Telephone Encounter (Signed)
printed and waiting for signature

## 2011-05-04 NOTE — Telephone Encounter (Signed)
Pt requesting refill on methylphenidate (METHYLIN) 5 MG tablet

## 2011-05-13 ENCOUNTER — Ambulatory Visit (INDEPENDENT_AMBULATORY_CARE_PROVIDER_SITE_OTHER): Payer: Medicare Other

## 2011-05-13 DIAGNOSIS — J309 Allergic rhinitis, unspecified: Secondary | ICD-10-CM

## 2011-06-08 ENCOUNTER — Ambulatory Visit (INDEPENDENT_AMBULATORY_CARE_PROVIDER_SITE_OTHER): Payer: Medicare Other | Admitting: Internal Medicine

## 2011-06-08 ENCOUNTER — Encounter: Payer: Self-pay | Admitting: Internal Medicine

## 2011-06-08 VITALS — BP 124/78 | HR 72 | Temp 98.1°F | Resp 14 | Ht 63.0 in | Wt 140.0 lb

## 2011-06-08 DIAGNOSIS — G4700 Insomnia, unspecified: Secondary | ICD-10-CM

## 2011-06-08 DIAGNOSIS — F439 Reaction to severe stress, unspecified: Secondary | ICD-10-CM

## 2011-06-08 DIAGNOSIS — Z639 Problem related to primary support group, unspecified: Secondary | ICD-10-CM

## 2011-06-08 DIAGNOSIS — E039 Hypothyroidism, unspecified: Secondary | ICD-10-CM

## 2011-06-08 DIAGNOSIS — D649 Anemia, unspecified: Secondary | ICD-10-CM

## 2011-06-08 DIAGNOSIS — R51 Headache: Secondary | ICD-10-CM

## 2011-06-08 DIAGNOSIS — R5383 Other fatigue: Secondary | ICD-10-CM

## 2011-06-08 LAB — CBC WITH DIFFERENTIAL/PLATELET
Basophils Absolute: 0 10*3/uL (ref 0.0–0.1)
Eosinophils Absolute: 0.2 10*3/uL (ref 0.0–0.7)
Hemoglobin: 13.4 g/dL (ref 12.0–15.0)
Lymphocytes Relative: 44.6 % (ref 12.0–46.0)
Lymphs Abs: 2.2 10*3/uL (ref 0.7–4.0)
MCHC: 34.2 g/dL (ref 30.0–36.0)
MCV: 92.5 fl (ref 78.0–100.0)
Monocytes Absolute: 0.4 10*3/uL (ref 0.1–1.0)
Neutro Abs: 2.1 10*3/uL (ref 1.4–7.7)
RDW: 13.4 % (ref 11.5–14.6)

## 2011-06-08 MED ORDER — ZOLPIDEM TARTRATE 10 MG PO TABS: 10.0000 mg | ORAL_TABLET | Freq: Every evening | ORAL | Status: DC | PRN

## 2011-06-08 MED ORDER — CYANOCOBALAMIN 1000 MCG/ML IJ SOLN: 1000.0000 ug | INTRAMUSCULAR | Status: DC

## 2011-06-08 MED ORDER — LISDEXAMFETAMINE DIMESYLATE 40 MG PO CAPS: 40.0000 mg | ORAL_CAPSULE | ORAL | Status: DC

## 2011-06-08 MED ORDER — HYDROCODONE-ACETAMINOPHEN 10-500 MG PO TABS: 1.0000 | ORAL_TABLET | Freq: Four times a day (QID) | ORAL | Status: DC | PRN

## 2011-06-08 NOTE — Progress Notes (Signed)
  Subjective:    Patient ID: [name], female    DOB: 03/16/1941, 71 y.o.   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 Negative for activity change and appetite change.  HENT: Negative for ear pain, congestion, neck pain, postnasal drip and sinus pressure.   Eyes: Negative for redness and visual disturbance.  Respiratory: Negative for cough, shortness of breath and wheezing.   Gastrointestinal: Negative for abdominal pain and abdominal distention.  Genitourinary: Negative for dysuria, frequency and menstrual problem.  Musculoskeletal: Negative for myalgias, joint swelling and arthralgias.  Skin: Negative for rash and wound.       dry  Neurological: Positive for weakness. Negative for dizziness and headaches.  Hematological: Negative for adenopathy. Does not bruise/bleed easily.  Psychiatric/Behavioral: Negative for sleep disturbance and decreased concentration.       Objective:   Physical Exam  Nursing note and vitals reviewed. Constitutional: She is oriented to person, place, and time. She appears well-developed and well-nourished. No distress.  HENT:  Head: Normocephalic and atraumatic.  Right Ear: External ear normal.  Left Ear: External ear normal.  Nose: Nose normal.  Mouth/Throat: Oropharynx is clear and moist.  Eyes: Conjunctivae and EOM are normal. Pupils are equal, round, and reactive to light.  Neck: Normal range of motion. Neck supple. No JVD present. No tracheal deviation present. No thyromegaly present.  Cardiovascular: Normal rate, regular rhythm, normal heart sounds and intact distal pulses.   No murmur heard. Pulmonary/Chest: Effort normal and breath sounds normal. She has no wheezes. She exhibits no tenderness.  Abdominal: Soft. Bowel sounds are normal.  Musculoskeletal: Normal range of  motion. She exhibits no edema and no tenderness.  Lymphadenopathy:    She has no cervical adenopathy.  Neurological: She is alert and oriented to person, place, and time. She has normal reflexes. No cranial nerve deficit.  Skin: Skin is warm and dry. She is not diaphoretic.  Psychiatric: She has a normal mood and affect. Her behavior is normal.          Assessment & Plan:  monitor iron, CBC and TSH with T3 free and T4 free  The cause of her increased fatigue is likely to be stress but metabolic causes should not be ignored.   I have spent more than 30 minutes examining this patient face-to-face of which over half was spent in counseling about stress management

## 2011-06-08 NOTE — Patient Instructions (Signed)
The patient is instructed to continue all medications as prescribed. Schedule followup with check out clerk upon leaving the clinic  

## 2011-06-09 LAB — TSH: TSH: 0.05 u[IU]/mL — ABNORMAL LOW (ref 0.35–5.50)

## 2011-06-10 ENCOUNTER — Other Ambulatory Visit: Payer: Self-pay | Admitting: *Deleted

## 2011-06-14 ENCOUNTER — Ambulatory Visit: Payer: PRIVATE HEALTH INSURANCE | Admitting: Internal Medicine

## 2011-06-23 NOTE — Progress Notes (Signed)
  Subjective:    Patient ID: [name], female    DOB: 1941-04-04, 71 y.o.   MRN: [id number]  HPI    Review of Systems     Objective:   Physical Exam        Assessment & Plan:

## 2011-07-19 ENCOUNTER — Encounter: Payer: Self-pay | Admitting: Internal Medicine

## 2011-07-26 ENCOUNTER — Other Ambulatory Visit: Payer: Self-pay | Admitting: Internal Medicine

## 2011-07-30 ENCOUNTER — Telehealth: Payer: Self-pay | Admitting: *Deleted

## 2011-07-30 MED ORDER — SUMATRIPTAN SUCCINATE 100 MG PO TABS: 100.0000 mg | ORAL_TABLET | ORAL | Status: DC | PRN

## 2011-07-30 NOTE — Telephone Encounter (Signed)
Called pt and reminded her it was time for her 6 month prolia injection--states she doesn't want it,stating that she had read all the information on it and thinks that is the reason her joints her so painful now-- I reminded her of how painful compression fx are, but she states she still doesn't want it.  I told her I would inform dr Lovell Sheehan

## 2011-07-31 ENCOUNTER — Other Ambulatory Visit: Payer: Self-pay | Admitting: Internal Medicine

## 2011-09-08 ENCOUNTER — Other Ambulatory Visit: Payer: Self-pay | Admitting: *Deleted

## 2011-09-08 ENCOUNTER — Ambulatory Visit: Payer: Medicare Other | Admitting: Internal Medicine

## 2011-09-08 MED ORDER — LISDEXAMFETAMINE DIMESYLATE 40 MG PO CAPS: 40.0000 mg | ORAL_CAPSULE | ORAL | Status: DC

## 2011-09-09 ENCOUNTER — Telehealth: Payer: Self-pay | Admitting: Internal Medicine

## 2011-09-09 NOTE — Telephone Encounter (Signed)
Pt called on 09/08/11 and req a refill of Vyvanse 40mg . Pls call when ready for pick up. Refill has been done by pcp.

## 2011-09-09 NOTE — Telephone Encounter (Signed)
Done

## 2011-09-09 NOTE — Telephone Encounter (Signed)
May refill 

## 2011-09-15 ENCOUNTER — Ambulatory Visit: Payer: Medicare Other | Admitting: Internal Medicine

## 2011-09-20 ENCOUNTER — Encounter: Payer: Self-pay | Admitting: Internal Medicine

## 2011-09-20 ENCOUNTER — Ambulatory Visit (INDEPENDENT_AMBULATORY_CARE_PROVIDER_SITE_OTHER): Payer: Medicare Other | Admitting: Internal Medicine

## 2011-09-20 VITALS — BP 136/84 | HR 80 | Temp 98.2°F | Resp 16 | Ht 63.0 in | Wt 140.0 lb

## 2011-09-20 DIAGNOSIS — M1990 Unspecified osteoarthritis, unspecified site: Secondary | ICD-10-CM

## 2011-09-20 DIAGNOSIS — M818 Other osteoporosis without current pathological fracture: Secondary | ICD-10-CM

## 2011-09-20 DIAGNOSIS — IMO0001 Reserved for inherently not codable concepts without codable children: Secondary | ICD-10-CM

## 2011-09-20 NOTE — Progress Notes (Signed)
Subjective:    Patient ID: [name], female    DOB: 01/22/1941, 71 y.o.   MRN: [id number]  HPI The pt has a reaction to prolia... Rash and pain below knee. Her pain is controlled The ADD medication seems to help constipation stable     Review of Systems  Constitutional: Negative for activity change, appetite change and fatigue.  HENT: Negative for ear pain, congestion, neck pain, postnasal drip and sinus pressure.   Eyes: Negative for redness and visual disturbance.  Respiratory: Negative for cough, shortness of breath and wheezing.   Gastrointestinal: Negative for abdominal pain and abdominal distention.  Genitourinary: Negative for dysuria, frequency and menstrual problem.  Musculoskeletal: Negative for myalgias, joint swelling and arthralgias.  Skin: Negative for rash and wound.  Neurological: Negative for dizziness, weakness and headaches.  Hematological: Negative for adenopathy. Does not bruise/bleed easily.  Psychiatric/Behavioral: Negative for sleep disturbance and decreased concentration.   Past Medical History  Diagnosis Date  . Allergy   . GERD (gastroesophageal reflux disease)   . Thyroid disease   . Fibromyalgia   . Headache   . Fibromyalgia   . Urinary incontinence   . Degenerative disk disease     History   Social History  . Marital Status: Married    Spouse Name: N/A    Number of Children: N/A  . Years of Education: N/A   Occupational History  . retired    Social History Main Topics  . Smoking status: Never Smoker   . Smokeless tobacco: Not on file  . Alcohol Use: No  . Drug Use: No  . Sexually Active: Yes   Other Topics Concern  . Not on file   Social History Narrative  . No narrative on file    Past Surgical History  Procedure Date  . Abdominal hysterectomy   . Total abdominal hysterectomy 1983  . Colonoscopy 01/27/2004  . Total knee arthroplasty 2008/2011    both knees  . Rectocele repair   . Rotator cuff repair      Family History  Problem Relation Age of Onset  . Colon cancer    . Irritable bowel syndrome    . Dementia Mother     Allergies  Allergen Reactions  . Terbinafine Hcl Hives    Current Outpatient Prescriptions on File Prior to Visit  Medication Sig Dispense Refill  . bisacodyl (DULCOLAX) 5 MG EC tablet Take 5 mg by mouth at bedtime as needed.        . cyanocobalamin (,VITAMIN B-12,) 1000 MCG/ML injection Inject 1 mL (1,000 mcg total) into the muscle every 6 (six) weeks.  10 mL  6  . EPINEPHrine (EPI-PEN) 0.3 mg/0.3 mL DEVI Inject 0.3 mg into the muscle as needed.        . folic acid (FOLVITE) 1 MG tablet 1 mg every other day.       Marland Kitchen HYDROcodone-acetaminophen (LORTAB) 10-500 MG per tablet Take 1 tablet by mouth every 6 (six) hours as needed.  30 tablet  3  . levothyroxine (SYNTHROID, LEVOTHROID) 150 MCG tablet Take 1 tablet (150 mcg total) by mouth daily.  30 tablet  11  . lisdexamfetamine (VYVANSE) 40 MG capsule Take 1 capsule (40 mg total) by mouth every morning.  30 capsule  0  . Multiple Vitamins-Minerals (OCUVITE ADULT FORMULA) CAPS Take by mouth daily.        . multivitamin (THERAGRAN) per tablet Take 1 tablet by mouth daily.        Marland Kitchen  NASONEX 50 MCG/ACT nasal spray INHALE 2 SPRAYS IN EACH NOSTRIL DAILY  17 g  11  . NON FORMULARY Allergy vaccine       . Omega-3 Fatty Acids 300 MG CAPS Take 300 mg by mouth daily.        . predniSONE (DELTASONE) 5 MG tablet Take 5 mg by mouth daily.      . SUMAtriptan (IMITREX) 100 MG tablet Take 1 tablet (100 mg total) by mouth every 2 (two) hours as needed.  9 tablet  3  . Trospium Chloride (SANCTURA XR) 60 MG CP24 Take 1 capsule (60 mg total) by mouth daily.  30 each  3  . zolpidem (AMBIEN) 10 MG tablet Take 1 tablet (10 mg total) by mouth at bedtime as needed for sleep.  30 tablet  0  . DISCONTD: lisdexamfetamine (VYVANSE) 40 MG capsule Take 1 capsule (40 mg total) by mouth every morning.  30 capsule  0    BP 136/84  Pulse 80  Temp 98.2  F (36.8 C)  Resp 16  Ht 5\' 3"  (1.6 m)  Wt 140 lb (63.504 kg)  BMI 24.80 kg/m2       Objective:   Physical Exam  Nursing note and vitals reviewed. Constitutional: She is oriented to person, place, and time. She appears well-developed and well-nourished. No distress.  HENT:  Head: Normocephalic and atraumatic.  Right Ear: External ear normal.  Left Ear: External ear normal.  Nose: Nose normal.  Mouth/Throat: Oropharynx is clear and moist.  Eyes: Conjunctivae and EOM are normal. Pupils are equal, round, and reactive to light.  Neck: Normal range of motion. Neck supple. No JVD present. No tracheal deviation present. No thyromegaly present.  Cardiovascular: Normal rate, regular rhythm, normal heart sounds and intact distal pulses.   No murmur heard. Pulmonary/Chest: Effort normal and breath sounds normal. She has no wheezes. She exhibits no tenderness.  Abdominal: Soft. Bowel sounds are normal.  Musculoskeletal: Normal range of motion. She exhibits no edema and no tenderness.  Lymphadenopathy:    She has no cervical adenopathy.  Neurological: She is alert and oriented to person, place, and time. She has normal reflexes. No cranial nerve deficit.  Skin: Skin is warm and dry. She is not diaphoretic.  Psychiatric: She has a normal mood and affect. Her behavior is normal.          Assessment & Plan:  The vitamin b1 has helped her neuropathy and has helped the overactive bladder ADD treated with vyanse and tolerating well Is on injection therapy for bone density and felt that it flared her fibromyalgia pain with rash Would not repeat the prolia

## 2011-09-20 NOTE — Patient Instructions (Signed)
The patient is instructed to continue all medications as prescribed. Schedule followup with check out clerk upon leaving the clinic  

## 2011-10-05 ENCOUNTER — Ambulatory Visit (INDEPENDENT_AMBULATORY_CARE_PROVIDER_SITE_OTHER): Payer: Medicare Other

## 2011-10-05 DIAGNOSIS — J309 Allergic rhinitis, unspecified: Secondary | ICD-10-CM

## 2011-10-19 ENCOUNTER — Other Ambulatory Visit: Payer: Self-pay | Admitting: Internal Medicine

## 2011-10-25 ENCOUNTER — Other Ambulatory Visit: Payer: Self-pay | Admitting: Internal Medicine

## 2011-11-01 ENCOUNTER — Encounter: Payer: Self-pay | Admitting: Internal Medicine

## 2011-11-01 ENCOUNTER — Ambulatory Visit (INDEPENDENT_AMBULATORY_CARE_PROVIDER_SITE_OTHER): Payer: Medicare Other | Admitting: Internal Medicine

## 2011-11-01 VITALS — BP 122/86 | HR 110 | Ht 63.0 in | Wt 141.0 lb

## 2011-11-01 DIAGNOSIS — J301 Allergic rhinitis due to pollen: Secondary | ICD-10-CM

## 2011-11-01 NOTE — Progress Notes (Signed)
Subjective:    Patient ID: [name], female    DOB: 27-Dec-1940, 71 y.o.   MRN: [id number]  HPI  10/30/10- 43 yoF followed for allergic rhinitis Last here December 15, 2009. Coming today for allergy skin testing. Has continued allergy vaccine at 1:50. from initial testing in 2011.She was having some local reactions to allergy vaccine. Last injection was 10/22/10- separate vials until the last 2 weeks.  Main c/o is nasal congestion which seems exhausting. Uses a decongestant nose spray each night at bedtime so she won't snore. Otherwise uses saline. Denies wheeze or chest tightness.  [place]. Doesn't feel well taking antihistamines.  Skin test-  Numerous mild reactions- some differences with more included compared with 2011 testing.  11/01/11- 70 yoF  never smoker followed for allergic rhinitis Retested allergy vaccine 2012. Can not tell any difference with being on vaccine; still having allergy flare ups(consistent) Insufficient help at 1:50. Air conditioner vent blows right over her head at night. [place] discussed both these issues. Ears keep stopping up.  ROS-see HPI Constitutional:   No-   weight loss, night sweats, fevers, chills, fatigue, lassitude. HEENT:   No-  headaches, difficulty swallowing, tooth/dental problems, sore throat,       +sneezing, itching, ear ache, nasal congestion, post nasal drip,  CV:  No-  chest pain, orthopnea, PND, swelling in lower extremities, anasarca,  dizziness, palpitations Resp: No-   shortness of breath with exertion or at rest.              No-   productive cough,  No non-productive cough,  No- coughing up of blood.              No-   change in color of mucus.  No- wheezing.   Skin: No-   rash or lesions. GI:  No-   heartburn, indigestion, abdominal pain, nausea, vomiting,  GU: N MS:  No-   joint pain or swelling.   Neuro-     nothing unusual Psych:  No- change in mood or affect. No depression or anxiety.  No memory  loss.  OBJ- Physical Exam General- Alert, Oriented, Affect-appropriate, Distress- none acute Skin- rash-none, lesions- none, excoriation- none Lymphadenopathy- none Head- atraumatic            Eyes- Gross vision intact, PERRLA, conjunctivae and secretions clear            Ears- Hearing, canals-nonocclusive cerumen            Nose- Clear, no-Septal dev, mucus, polyps, erosion, perforation             Throat- Mallampati II , mucosa clear , drainage- none, tonsils- atrophic Neck- flexible , trachea midline, no stridor , thyroid nl, carotid no bruit Chest - symmetrical excursion , unlabored           Heart/CV- RRR , no murmur , no gallop  , no rub, nl s1 s2                           - JVD- none , edema- none, stasis changes- none, varices- none           Lung- clear to P&A, wheeze- none, cough- none , dullness-none, rub- none           Chest wall-  Abd-  Br/ Gen/ Rectal- Not done, not indicated Extrem- cyanosis- none, clubbing, none, atrophy- none, strength-  nl Neuro- grossly intact to observation

## 2011-11-01 NOTE — Patient Instructions (Addendum)
I will have the allergy lab increase the strength of your allergy shots to 1:10 next time you order.  Sample Dymista 1-2 sprays each nostril once daily at bedtime

## 2011-11-02 ENCOUNTER — Ambulatory Visit: Payer: PRIVATE HEALTH INSURANCE | Admitting: Internal Medicine

## 2011-11-07 ENCOUNTER — Encounter: Payer: Self-pay | Admitting: Internal Medicine

## 2011-11-07 NOTE — Assessment & Plan Note (Signed)
Discussed options Plan-advanced vaccine to 1:10 with this mix, before concluding that isn't going to help enough. Sample Dymista nasal spray. Reviewed advice on environmental dust and mold precautions.

## 2011-11-11 ENCOUNTER — Telehealth: Payer: Self-pay | Admitting: Internal Medicine

## 2011-11-11 MED ORDER — CLARITHROMYCIN 500 MG PO TABS: 500.0000 mg | ORAL_TABLET | Freq: Two times a day (BID) | ORAL | Status: AC

## 2011-11-11 NOTE — Telephone Encounter (Signed)
Ok biaxin 500 mg, # 20, 1 twice daily after meals

## 2011-11-11 NOTE — Telephone Encounter (Signed)
Pt says the Dymista is not working for her and too "perfumy". This causes her headaches to be worse. She believes she needs to try an antibiotic and mentioned Biaxin. She says this has "knocked" out her nasal congestion/symptoms in the past. Pls advise. Allergies  Allergen Reactions  . Terbinafine Hcl Hives  . Prolia (Denosumab)     rash

## 2011-11-11 NOTE — Telephone Encounter (Signed)
Pt aware and Biaxin sent to her pharmacy. She will call if she has any further problems or questions.

## 2011-11-23 ENCOUNTER — Telehealth: Payer: Self-pay | Admitting: Internal Medicine

## 2011-11-23 NOTE — Telephone Encounter (Signed)
fyi for Dr Maple Hudson

## 2011-11-25 ENCOUNTER — Telehealth: Payer: Self-pay | Admitting: Internal Medicine

## 2011-11-25 MED ORDER — LISDEXAMFETAMINE DIMESYLATE 40 MG PO CAPS: 40.0000 mg | ORAL_CAPSULE | ORAL | Status: DC

## 2011-11-25 NOTE — Telephone Encounter (Signed)
Pt  Requesting refill on lisdexamfetamine (VYVANSE) 40 MG capsule  Pt states her husband will be in  today and would like to pick up today because they live an hour away

## 2011-11-25 NOTE — Telephone Encounter (Signed)
Printed and will cal pt wh en ready for pick up

## 2011-12-23 ENCOUNTER — Ambulatory Visit (INDEPENDENT_AMBULATORY_CARE_PROVIDER_SITE_OTHER): Payer: Medicare Other

## 2011-12-23 DIAGNOSIS — J309 Allergic rhinitis, unspecified: Secondary | ICD-10-CM

## 2012-01-12 ENCOUNTER — Other Ambulatory Visit: Payer: Self-pay | Admitting: Internal Medicine

## 2012-01-24 ENCOUNTER — Encounter (HOSPITAL_COMMUNITY): Payer: Self-pay | Admitting: Family Medicine

## 2012-01-24 ENCOUNTER — Emergency Department (HOSPITAL_COMMUNITY)
Admission: EM | Admit: 2012-01-24 | Discharge: 2012-01-25 | Disposition: A | Discharge status: Home / Self Care | Payer: Medicare Other | Attending: Emergency Medicine | Admitting: Emergency Medicine

## 2012-01-24 DIAGNOSIS — Z79899 Other long term (current) drug therapy: Secondary | ICD-10-CM | POA: Insufficient documentation

## 2012-01-24 DIAGNOSIS — K219 Gastro-esophageal reflux disease without esophagitis: Secondary | ICD-10-CM | POA: Insufficient documentation

## 2012-01-24 DIAGNOSIS — IMO0001 Reserved for inherently not codable concepts without codable children: Secondary | ICD-10-CM | POA: Insufficient documentation

## 2012-01-24 DIAGNOSIS — IMO0002 Reserved for concepts with insufficient information to code with codable children: Secondary | ICD-10-CM | POA: Insufficient documentation

## 2012-01-24 DIAGNOSIS — E079 Disorder of thyroid, unspecified: Secondary | ICD-10-CM | POA: Insufficient documentation

## 2012-01-24 DIAGNOSIS — R42 Dizziness and giddiness: Secondary | ICD-10-CM | POA: Insufficient documentation

## 2012-01-24 DIAGNOSIS — R000 Tachycardia, unspecified: Secondary | ICD-10-CM | POA: Insufficient documentation

## 2012-01-24 LAB — URINALYSIS, ROUTINE W REFLEX MICROSCOPIC
Bilirubin Urine: NEGATIVE
Glucose, UA: NEGATIVE mg/dL
Hgb urine dipstick: NEGATIVE
Ketones, ur: NEGATIVE mg/dL
Protein, ur: NEGATIVE mg/dL

## 2012-01-24 LAB — COMPREHENSIVE METABOLIC PANEL
AST: 20 U/L (ref 0–37)
Albumin: 4.1 g/dL (ref 3.5–5.2)
Alkaline Phosphatase: 54 U/L (ref 39–117)
Chloride: 102 mEq/L (ref 96–112)
Potassium: 4.5 mEq/L (ref 3.5–5.1)
Total Bilirubin: 0.5 mg/dL (ref 0.3–1.2)
Total Protein: 6.8 g/dL (ref 6.0–8.3)

## 2012-01-24 LAB — CBC WITH DIFFERENTIAL/PLATELET
Basophils Absolute: 0 10*3/uL (ref 0.0–0.1)
Basophils Relative: 0 % (ref 0–1)
Eosinophils Absolute: 0.1 10*3/uL (ref 0.0–0.7)
Hemoglobin: 14.1 g/dL (ref 12.0–15.0)
MCH: 31 pg (ref 26.0–34.0)
MCHC: 34.4 g/dL (ref 30.0–36.0)
Neutro Abs: 4.4 10*3/uL (ref 1.7–7.7)
Neutrophils Relative %: 69 % (ref 43–77)
Platelets: 199 10*3/uL (ref 150–400)
RDW: 13.1 % (ref 11.5–15.5)

## 2012-01-24 LAB — POCT I-STAT TROPONIN I: Troponin i, poc: 0.06 ng/mL (ref 0.00–0.08)

## 2012-01-24 NOTE — ED Notes (Signed)
Pt sts that night she had an episode of dizziness, diaphoresis, felt like her heart was racing, mouth was watering. sts yesterday was very stressful and there was some anxiety involved.

## 2012-01-24 NOTE — ED Notes (Signed)
Patient had reported heartrate up to 150 with hypotension during episode of diaphoresis, shaking, and not feeling well.  Patient was cool and clammy per the spouse and her heartrate was irregular with skipping.  Patient denies any deficits during the episode.  Neuro is intact at this time and she no longer has any of the sx.  She is sinus tach on the monitor at this time and normo tensive

## 2012-01-25 ENCOUNTER — Telehealth: Payer: Self-pay | Admitting: Internal Medicine

## 2012-01-25 ENCOUNTER — Emergency Department (HOSPITAL_COMMUNITY): Payer: Medicare Other

## 2012-01-25 LAB — POCT I-STAT TROPONIN I: Troponin i, poc: 0.02 ng/mL (ref 0.00–0.08)

## 2012-01-25 NOTE — ED Notes (Signed)
Patient is resting, remains in nsr,  No s/sx of distress.

## 2012-01-25 NOTE — ED Notes (Signed)
Patient has been given discharge papers by lana rn. She has called her husband and he is coming to get her

## 2012-01-25 NOTE — ED Notes (Signed)
States she didn't feel good yesterday when she got up. Stats husband told her she was trembling. Period of nausea, dizziness and diaphoretic.

## 2012-01-25 NOTE — Telephone Encounter (Signed)
Caller: [name]/Patient; Phone: [phone]; Reason for Call: Patient was seen at [facility] ED last night and was kept overnight.  MD from ED wants Dr Lovell Sheehan to contact hospital to get patient records from visit.  The MR number is [id number].  Patient would also like to know if Dr Lovell Sheehan wants to change her appointment to an earlier day or time.

## 2012-01-25 NOTE — ED Provider Notes (Signed)
History     CSN: [id number]  Arrival date & time 01/24/12  1803   First MD Initiated Contact with Patient 01/24/12 2349      Chief Complaint  Patient presents with  . Dizziness    (Consider location/radiation/quality/duration/timing/severity/associated sxs/prior treatment) HPI HX per PT. 11am today developed nausea, dizzieness, sweating and palpitations, husband noted her her pulse war elevated to rate 158 on a monitor. No h/o afib. Ha sthyroid problems no recent change in meds. Recent stressful family event. No CP or SOB. No h/o same. Currently feels well. Single episode lasted about 3 hours.  Past Medical History  Diagnosis Date  . Allergy   . GERD (gastroesophageal reflux disease)   . Thyroid disease   . Fibromyalgia   . Headache   . Fibromyalgia   . Urinary incontinence   . Degenerative disk disease     Past Surgical History  Procedure Date  . Abdominal hysterectomy   . Total abdominal hysterectomy 1983  . Colonoscopy 01/27/2004  . Total knee arthroplasty 2008/2011    both knees  . Rectocele repair   . Rotator cuff repair     Family History  Problem Relation Age of Onset  . Colon cancer    . Irritable bowel syndrome    . Dementia Mother     History  Substance Use Topics  . Smoking status: Never Smoker   . Smokeless tobacco: Not on file  . Alcohol Use: No    OB History    Grav Para Term Preterm Abortions TAB SAB Ect Mult Living                  Review of Systems  Constitutional: Negative for fever and chills.  HENT: Negative for neck pain and neck stiffness.   Eyes: Negative for pain.  Respiratory: Negative for shortness of breath.   Cardiovascular: Positive for palpitations. Negative for chest pain.  Gastrointestinal: Positive for nausea. Negative for vomiting and abdominal pain.  Genitourinary: Negative for dysuria.  Musculoskeletal: Negative for back pain.  Skin: Negative for rash.  Neurological: Positive for dizziness. Negative for  headaches.  All other systems reviewed and are negative.    Allergies  Terbinafine hcl and Prolia  Home Medications   Current Outpatient Rx  Name Route Sig Dispense Refill  . BISACODYL 5 MG PO TBEC Oral Take 5 mg by mouth at bedtime as needed.      . CYANOCOBALAMIN 1000 MCG/ML IJ SOLN Intramuscular Inject 1 mL (1,000 mcg total) into the muscle every 6 (six) weeks. 10 mL 6  . EPINEPHRINE 0.3 MG/0.3ML IJ DEVI Intramuscular Inject 0.3 mg into the muscle as needed.     Marland Kitchen FOLIC ACID 1 MG PO TABS Oral Take 1 mg by mouth daily.    Marland Kitchen HYDROCODONE-ACETAMINOPHEN 10-500 MG PO TABS Oral Take 0.5-1 tablets by mouth every 6 (six) hours as needed. For pain. Pt can take 1/2 to 1 tablet depending on pain level    . LEVOTHYROXINE SODIUM 150 MCG PO TABS Oral Take 150 mcg by mouth daily.    Marland Kitchen LISDEXAMFETAMINE DIMESYLATE 40 MG PO CAPS Oral Take 1 capsule (40 mg total) by mouth every morning. 30 capsule 0  . OCUVITE ADULT FORMULA PO CAPS Oral Take 1 capsule by mouth daily.     . MULTIVITAMINS PO TABS Oral Take 1 tablet by mouth daily.     Marland Kitchen PREDNISONE 5 MG PO TABS Oral Take 5 mg by mouth daily.    . SUMATRIPTAN SUCCINATE  100 MG PO TABS Oral Take 1 tablet (100 mg total) by mouth every 2 (two) hours as needed. 9 tablet 3  . VITAMIN B-1 100 MG PO TABS Oral Take 100 mg by mouth daily.    Marland Kitchen ZOLPIDEM TARTRATE 10 MG PO TABS Oral Take 1 tablet (10 mg total) by mouth at bedtime as needed for sleep. 30 tablet 0    BP 118/71  Pulse 88  Temp 97.9 F (36.6 C) (Oral)  Resp 11  SpO2 100%  Physical Exam  Constitutional: She is oriented to person, place, and time. She appears well-developed and well-nourished.  HENT:  Head: Normocephalic and atraumatic.  Eyes: Conjunctivae and EOM are normal. Pupils are equal, round, and reactive to light.  Neck: Trachea normal. Neck supple. No thyromegaly present.  Cardiovascular: Normal rate, regular rhythm, S1 normal, S2 normal and normal pulses.     No systolic murmur is  present   No diastolic murmur is present  Pulses:      Radial pulses are 2+ on the right side, and 2+ on the left side.  Pulmonary/Chest: Effort normal and breath sounds normal. She has no wheezes. She has no rhonchi. She has no rales. She exhibits no tenderness.  Abdominal: Soft. Normal appearance and bowel sounds are normal. There is no tenderness. There is no CVA tenderness and negative Murphy's sign.  Musculoskeletal:       BLE:s Calves nontender, no cords or erythema, negative Homans sign  Neurological: She is alert and oriented to person, place, and time. She has normal strength. No cranial nerve deficit or sensory deficit. GCS eye subscore is 4. GCS verbal subscore is 5. GCS motor subscore is 6.  Skin: Skin is warm and dry. No rash noted. She is not diaphoretic.  Psychiatric: Her speech is normal.       Cooperative and appropriate    ED Course  Procedures (including critical care time)  Results for orders placed during the hospital encounter of 01/24/12  CBC WITH DIFFERENTIAL      Component Value Range   WBC 6.4  4.0 - 10.5 K/uL   RBC 4.55  3.87 - 5.11 MIL/uL   Hemoglobin 14.1  12.0 - 15.0 g/dL   HCT 60.4  54.0 - 98.1 %   MCV 90.1  78.0 - 100.0 fL   MCH 31.0  26.0 - 34.0 pg   MCHC 34.4  30.0 - 36.0 g/dL   RDW 19.1  47.8 - 29.5 %   Platelets 199  150 - 400 K/uL   Neutrophils Relative 69  43 - 77 %   Neutro Abs 4.4  1.7 - 7.7 K/uL   Lymphocytes Relative 24  12 - 46 %   Lymphs Abs 1.5  0.7 - 4.0 K/uL   Monocytes Relative 6  3 - 12 %   Monocytes Absolute 0.4  0.1 - 1.0 K/uL   Eosinophils Relative 1  0 - 5 %   Eosinophils Absolute 0.1  0.0 - 0.7 K/uL   Basophils Relative 0  0 - 1 %   Basophils Absolute 0.0  0.0 - 0.1 K/uL  COMPREHENSIVE METABOLIC PANEL      Component Value Range   Sodium 139  135 - 145 mEq/L   Potassium 4.5  3.5 - 5.1 mEq/L   Chloride 102  96 - 112 mEq/L   CO2 27  19 - 32 mEq/L   Glucose, Bld 166 (*) 70 - 99 mg/dL   BUN 22  6 - 23 mg/dL  Creatinine,  Ser 0.68  0.50 - 1.10 mg/dL   Calcium 24.4  8.4 - 01.0 mg/dL   Total Protein 6.8  6.0 - 8.3 g/dL   Albumin 4.1  3.5 - 5.2 g/dL   AST 20  0 - 37 U/L   ALT 18  0 - 35 U/L   Alkaline Phosphatase 54  39 - 117 U/L   Total Bilirubin 0.5  0.3 - 1.2 mg/dL   GFR calc non Af Amer 85 (*) >90 mL/min   GFR calc Af Amer >90  >90 mL/min  URINALYSIS, ROUTINE W REFLEX MICROSCOPIC      Component Value Range   Color, Urine YELLOW  YELLOW   APPearance CLEAR  CLEAR   Specific Gravity, Urine 1.021  1.005 - 1.030   pH 6.0  5.0 - 8.0   Glucose, UA NEGATIVE  NEGATIVE mg/dL   Hgb urine dipstick NEGATIVE  NEGATIVE   Bilirubin Urine NEGATIVE  NEGATIVE   Ketones, ur NEGATIVE  NEGATIVE mg/dL   Protein, ur NEGATIVE  NEGATIVE mg/dL   Urobilinogen, UA 0.2  0.0 - 1.0 mg/dL   Nitrite NEGATIVE  NEGATIVE   Leukocytes, UA NEGATIVE  NEGATIVE  POCT I-STAT TROPONIN I      Component Value Range   Troponin i, poc 0.06  0.00 - 0.08 ng/mL   Comment 3           TSH      Component Value Range   TSH 0.038 (*) 0.350 - 4.500 uIU/mL  POCT I-STAT TROPONIN I      Component Value Range   Troponin i, poc 0.02  0.00 - 0.08 ng/mL   Comment 3           POCT I-STAT TROPONIN I      Component Value Range   Troponin i, poc 0.04  0.00 - 0.08 ng/mL   Comment 3            Dg Chest Portable 1 View  01/25/2012  *RADIOLOGY REPORT*  Clinical Data: Dizziness and sweating.  PORTABLE CHEST - 1 VIEW  Comparison: 04/01/2008  Findings: Emphysematous changes and scattered fibrosis in the lungs. The heart size and pulmonary vascularity are normal. The lungs appear clear and expanded without focal air space disease or consolidation. No blunting of the costophrenic angles.  No pneumothorax.  Mediastinal contours appear intact.  Calcification of the aorta.  Degenerative changes in the spine and shoulders.  No significant change since previous study.  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  No active pulmonary disease.   Original Report  Authenticated By: Marlon Pel, M.D.       Date: 01/25/2012  Rate: 112  Rhythm: sinus tachycardia  QRS Axis: normal  Intervals: normal  ST/T Wave abnormalities: nonspecific ST changes  Conduction Disutrbances:none  Narrative Interpretation:   Old EKG Reviewed: none available   12:19 AM asymptomatic. Labs reviewed as above. No symptoms in the ER.   Filed Vitals:   01/25/12 0000  BP: 118/71  Pulse: 88  Temp:   Resp: 11   1:07 AM MED consult for admit.  Case discussed as above with Dr Joneen Roach who does not feel PT requires admission.  PT in CDU for recheck troponin and TSH given thyroid history, Will continue to monitor for possible arrythmia/ fast heart rate by history.   Cardiac monitoring overnight no tachycardia or arrythmia captured. Remains NSR.   7:02 AM recheck serial troponins negative. Remains asymptomatic overnight in the ED. Plan close  PCP follow up. TSH noted - PT aware MDM   VS, old records, nursing notes reviewed. Evaluated with ECG, imaging, monitoring and labs as above.   No UTI or electrolyte abnormalities.       Sunnie Nielsen, MD 01/25/12 [phone]

## 2012-01-25 NOTE — ED Notes (Signed)
Patient up to use the bathroom,  She reports small amount of diarrhea

## 2012-01-27 NOTE — Telephone Encounter (Signed)
Pt called and has been waiting on a call back since 01/25/12 re: previous phone note. Pls call asap.

## 2012-01-27 NOTE — Telephone Encounter (Signed)
Per dr Lovell Sheehan- ok to continue current dosage of synthroid and will redraw thyroid labs at ov next week- pt informed

## 2012-01-31 ENCOUNTER — Encounter: Payer: Medicare Other | Admitting: Internal Medicine

## 2012-02-01 ENCOUNTER — Ambulatory Visit (INDEPENDENT_AMBULATORY_CARE_PROVIDER_SITE_OTHER): Payer: Medicare Other | Admitting: Internal Medicine

## 2012-02-01 ENCOUNTER — Encounter: Payer: Self-pay | Admitting: Internal Medicine

## 2012-02-01 VITALS — BP 130/80 | HR 76 | Temp 98.2°F | Resp 16 | Ht 63.0 in | Wt 138.0 lb

## 2012-02-01 DIAGNOSIS — E039 Hypothyroidism, unspecified: Secondary | ICD-10-CM

## 2012-02-01 DIAGNOSIS — Z23 Encounter for immunization: Secondary | ICD-10-CM

## 2012-02-01 DIAGNOSIS — IMO0001 Reserved for inherently not codable concepts without codable children: Secondary | ICD-10-CM

## 2012-02-01 DIAGNOSIS — J329 Chronic sinusitis, unspecified: Secondary | ICD-10-CM

## 2012-02-01 DIAGNOSIS — I479 Paroxysmal tachycardia, unspecified: Secondary | ICD-10-CM

## 2012-02-01 DIAGNOSIS — I959 Hypotension, unspecified: Secondary | ICD-10-CM

## 2012-02-01 MED ORDER — SULFAMETHOXAZOLE-TRIMETHOPRIM 800-160 MG PO TABS: 1.0000 | ORAL_TABLET | Freq: Two times a day (BID) | ORAL | Status: DC

## 2012-02-01 NOTE — Progress Notes (Signed)
  Subjective:    Patient ID: [name], female    DOB: Apr 27, 1941, 71 y.o.   MRN: [id number]  HPI Patient is a 71 year old female who presents for followup of fibromyalgia headaches osteoarthritis and mood disorder.  She had an episode in which she was noticed to become diaphoretic and tachycardic blood pressure was in first 200 should be for elevated heart rate and that her blood pressure dropped to 80/69. Pulse seemed to be irregular as assessed by both the blood pressure cuff and her husband took her pulse by the time she got to the emergency room she was still a bit dizzy but her pulse had subsided until normal range and her blood pressure was normal.  She does take a stimulant drugs for ADD and depression... she had not taken her next dose. She also is hypothyroid and is on 150 mcg of Synthroid   Review of Systems  Constitutional: Negative for activity change, appetite change and fatigue.  HENT: Negative for ear pain, congestion, neck pain, postnasal drip and sinus pressure.   Eyes: Negative for redness and visual disturbance.  Respiratory: Negative for cough, shortness of breath and wheezing.   Gastrointestinal: Negative for abdominal pain and abdominal distention.  Genitourinary: Negative for dysuria, frequency and menstrual problem.  Musculoskeletal: Negative for myalgias, joint swelling and arthralgias.  Skin: Negative for rash and wound.  Neurological: Negative for dizziness, weakness and headaches.  Hematological: Negative for adenopathy. Does not bruise/bleed easily.  Psychiatric/Behavioral: Negative for disturbed wake/sleep cycle and decreased concentration.       Objective:   Physical Exam  Nursing note and vitals reviewed. Constitutional: She is oriented to person, place, and time. She appears well-developed and well-nourished. No distress.  HENT:  Head: Normocephalic and atraumatic.  Right Ear: External ear normal.  Left Ear: External ear normal.  Nose: Nose  normal.  Mouth/Throat: Oropharynx is clear and moist.  Eyes: Conjunctivae normal and EOM are normal. Pupils are equal, round, and reactive to light.  Neck: Normal range of motion. Neck supple. No JVD present. No tracheal deviation present. No thyromegaly present.  Cardiovascular: Normal rate, regular rhythm, normal heart sounds and intact distal pulses.   No murmur heard. Pulmonary/Chest: Effort normal and breath sounds normal. She has no wheezes. She exhibits no tenderness.  Abdominal: Soft. Bowel sounds are normal.  Musculoskeletal: Normal range of motion. She exhibits no edema and no tenderness.  Lymphadenopathy:    She has no cervical adenopathy.  Neurological: She is alert and oriented to person, place, and time. She has normal reflexes. No cranial nerve deficit.  Skin: Skin is warm and dry. She is not diaphoretic.  Psychiatric: She has a normal mood and affect. Her behavior is normal.          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  The differential however is heavily weighted toward hypoglycemia as an etiology we'll measure her thyroid labs and we will get a glucose tolerance test.  If the symptoms persist after adjusting diet next step might be a event monitor her neurology referral.

## 2012-02-02 ENCOUNTER — Other Ambulatory Visit: Payer: Self-pay | Admitting: Internal Medicine

## 2012-02-02 ENCOUNTER — Other Ambulatory Visit (INDEPENDENT_AMBULATORY_CARE_PROVIDER_SITE_OTHER): Payer: Medicare Other

## 2012-02-02 DIAGNOSIS — E039 Hypothyroidism, unspecified: Secondary | ICD-10-CM

## 2012-02-02 DIAGNOSIS — E162 Hypoglycemia, unspecified: Secondary | ICD-10-CM

## 2012-02-02 LAB — BASIC METABOLIC PANEL
Calcium: 9.7 mg/dL (ref 8.4–10.5)
GFR: 71.82 mL/min (ref >60.00)
Glucose, Bld: 89 mg/dL (ref 70–99)
Sodium: 142 mEq/L (ref 135–145)

## 2012-02-02 LAB — GLUCOSE TOLERANCE, 3 HOURS: Glucose, 1 Hour GTT: 82 mg/dL

## 2012-02-02 LAB — T4, FREE: Free T4: 1.5 ng/dL (ref 0.60–1.60)

## 2012-02-07 ENCOUNTER — Encounter: Payer: Self-pay | Admitting: Internal Medicine

## 2012-02-07 NOTE — Patient Instructions (Signed)
The patient is instructed to continue all medications as prescribed. Schedule followup with check out clerk upon leaving the clinic  

## 2012-02-07 NOTE — Progress Notes (Signed)
Quick Note:  Called and spoke with pt and pt is aware. ______ 

## 2012-02-09 ENCOUNTER — Encounter: Payer: Self-pay | Admitting: Internal Medicine

## 2012-02-09 ENCOUNTER — Other Ambulatory Visit: Payer: Self-pay | Admitting: Internal Medicine

## 2012-02-18 ENCOUNTER — Telehealth: Payer: Self-pay | Admitting: Internal Medicine

## 2012-02-18 NOTE — Telephone Encounter (Signed)
Caller: Sofie/Patient; Patient Name: Maretta Los; PCP: Darryll Capers (Adults only); Best Callback Phone Number: [phone]; Reason for call: She is calling for refill and reconsideration of starting back  her Vyvance.  She states she had been on Vyvance for a year   for her Fibromyagia. She states when she takes the Vyvance she does not need the Hydrocodone.  She had a "spell" last month 01/24/12-  after taking an [place] and the next morning after Vyvance had heart palpatations.  She was seen by Dr. Lovell Sheehan on 02/01/12 and he took her off the medication.  She states she has not had any more tachycardia symptoms and wants to be back on medication.   Reviewed EPIC notes, history and medications. Advised patient I would send request to Dr. Lovell Sheehan. PLEASE CONTACT PATIENT FOR REFILL OF MEDICAITON.

## 2012-02-23 NOTE — Telephone Encounter (Signed)
Pt is aware waiting on MD °

## 2012-02-25 ENCOUNTER — Other Ambulatory Visit: Payer: Self-pay | Admitting: *Deleted

## 2012-02-25 MED ORDER — LISDEXAMFETAMINE DIMESYLATE 30 MG PO CAPS: 30.0000 mg | ORAL_CAPSULE | ORAL | Status: DC

## 2012-02-25 NOTE — Telephone Encounter (Signed)
Pt aware it will be ready by 12 noon

## 2012-02-25 NOTE — Telephone Encounter (Signed)
Patient spouse would like to pick this up at noon.

## 2012-02-25 NOTE — Telephone Encounter (Signed)
vyvance 30 is OK

## 2012-03-07 ENCOUNTER — Other Ambulatory Visit: Payer: Self-pay | Admitting: Internal Medicine

## 2012-03-24 ENCOUNTER — Telehealth: Payer: Self-pay | Admitting: Internal Medicine

## 2012-03-24 NOTE — Telephone Encounter (Signed)
Patient called stating that she would like a refill of her 40mg  of vyvanse as she does not think the 30mg  is as good. Please assist.

## 2012-03-27 ENCOUNTER — Other Ambulatory Visit: Payer: Self-pay | Admitting: *Deleted

## 2012-03-27 MED ORDER — LISDEXAMFETAMINE DIMESYLATE 40 MG PO CAPS: 40.0000 mg | ORAL_CAPSULE | ORAL | Status: DC

## 2012-03-27 NOTE — Telephone Encounter (Signed)
pprinted and pt's husband to pick up this pm

## 2012-03-31 ENCOUNTER — Other Ambulatory Visit: Payer: Self-pay | Admitting: *Deleted

## 2012-03-31 DIAGNOSIS — G4700 Insomnia, unspecified: Secondary | ICD-10-CM

## 2012-03-31 MED ORDER — ZOLPIDEM TARTRATE 10 MG PO TABS: 10.0000 mg | ORAL_TABLET | Freq: Every evening | ORAL | Status: DC | PRN

## 2012-04-10 ENCOUNTER — Other Ambulatory Visit: Payer: Self-pay | Admitting: Internal Medicine

## 2012-04-17 ENCOUNTER — Ambulatory Visit: Payer: Medicare Other | Admitting: Internal Medicine

## 2012-04-19 ENCOUNTER — Encounter: Payer: Self-pay | Admitting: Internal Medicine

## 2012-04-19 ENCOUNTER — Ambulatory Visit (INDEPENDENT_AMBULATORY_CARE_PROVIDER_SITE_OTHER): Payer: Medicare Other | Admitting: Internal Medicine

## 2012-04-19 VITALS — BP 140/90 | HR 112 | Temp 98.2°F | Resp 16 | Ht 63.0 in | Wt 140.0 lb

## 2012-04-19 DIAGNOSIS — E038 Other specified hypothyroidism: Secondary | ICD-10-CM

## 2012-04-19 DIAGNOSIS — E162 Hypoglycemia, unspecified: Secondary | ICD-10-CM

## 2012-04-19 DIAGNOSIS — I700 Atherosclerosis of aorta: Secondary | ICD-10-CM

## 2012-04-19 NOTE — Progress Notes (Signed)
Subjective:    Patient ID: [name], female    DOB: [birth year], 71 y.o.   MRN: [id number]  HPI Presents today for followup of multiple medical problems including elevated blood pressure day in the setting of a probably migraine headaches she is taken a trip planned to alleviate the headache to recheck her blood pressure shows it dropped to 140/90 she'll monitor blood pressure at home and report back if it is consistently elevated.  We discussed hypoglycemia and hypothyroidism in detail including the results of her blood work.  Patient noted calcification on her CT scan and we discussed the role of blood pressure and calcium supplementation in that as well as the consistency of calcification of the aorta with her age.     Review of Systems  Constitutional: Negative for activity change, appetite change and fatigue.  HENT: Negative for ear pain, congestion, neck pain, postnasal drip and sinus pressure.   Eyes: Negative for redness and visual disturbance.  Respiratory: Negative for cough, shortness of breath and wheezing.   Gastrointestinal: Negative for abdominal pain and abdominal distention.  Genitourinary: Positive for dysuria and urgency. Negative for frequency and menstrual problem.  Musculoskeletal: Negative for myalgias, joint swelling and arthralgias.  Skin: Negative for rash and wound.  Neurological: Negative for dizziness, weakness and headaches.  Hematological: Negative for adenopathy. Does not bruise/bleed easily.  Psychiatric/Behavioral: Negative for sleep disturbance and decreased concentration.   Past Medical History  Diagnosis Date  . Allergy   . GERD (gastroesophageal reflux disease)   . Thyroid disease   . Fibromyalgia   . Headache   . Fibromyalgia   . Urinary incontinence   . Degenerative disk disease     History   Social History  . Marital Status: Married    Spouse Name: N/A    Number of Children: N/A  . Years of Education: N/A   Occupational  History  . retired    Social History Main Topics  . Smoking status: Never Smoker   . Smokeless tobacco: Not on file  . Alcohol Use: No  . Drug Use: No  . Sexually Active: Yes   Other Topics Concern  . Not on file   Social History Narrative  . No narrative on file    Past Surgical History  Procedure Date  . Abdominal hysterectomy   . Total abdominal hysterectomy 1983  . Colonoscopy 01/27/2004  . Total knee arthroplasty 2008/2011    both knees  . Rectocele repair   . Rotator cuff repair     Family History  Problem Relation Age of Onset  . Colon cancer    . Irritable bowel syndrome    . Dementia Mother     Allergies  Allergen Reactions  . Terbinafine Hcl Hives  . Prolia (Denosumab)     rash    Current Outpatient Prescriptions on File Prior to Visit  Medication Sig Dispense Refill  . BENFOTIAMINE PO Take 250 mg by mouth 2 (two) times daily.      . bisacodyl (DULCOLAX) 5 MG EC tablet Take 5 mg by mouth at bedtime as needed.        . cyanocobalamin (,VITAMIN B-12,) 1000 MCG/ML injection Inject 1 mL (1,000 mcg total) into the muscle every 6 (six) weeks.  10 mL  6  . EPINEPHrine (EPI-PEN) 0.3 mg/0.3 mL DEVI Inject 0.3 mg into the muscle as needed.       . folic acid (FOLVITE) 1 MG tablet Take 1 mg by mouth. 1tab 3  times weekly      . HYDROcodone-acetaminophen (LORTAB) 10-500 MG per tablet TAKE 1 TO 1 & 1/2 TABLETS EVERY 6 HOURS AS NEEDED FOR PAIN.  75 tablet  2  . levothyroxine (SYNTHROID, LEVOTHROID) 150 MCG tablet Take 150 mcg by mouth daily.      Marland Kitchen lisdexamfetamine (VYVANSE) 40 MG capsule Take 1 capsule (40 mg total) by mouth every morning.  30 capsule  0  . Multiple Vitamins-Minerals (OCUVITE ADULT FORMULA) CAPS Take 1 capsule by mouth daily.       . multivitamin (THERAGRAN) per tablet Take 1 tablet by mouth daily.       . predniSONE (DELTASONE) 5 MG tablet TAKE (1) TABLET DAILY  90 tablet  1  . SUMAtriptan (IMITREX) 100 MG tablet TAKE 1 TABLET FOR MIGRAINES  9  tablet  3  . zolpidem (AMBIEN) 10 MG tablet Take 1 tablet (10 mg total) by mouth at bedtime as needed for sleep.  30 tablet  3  . predniSONE (DELTASONE) 5 MG tablet Take 5 mg by mouth daily.        BP 140/90  Pulse 112  Temp 98.2 F (36.8 C)  Resp 16  Ht 5\' 3"  (1.6 m)  Wt 140 lb (63.504 kg)  BMI 24.80 kg/m2       Objective:   Physical Exam  Constitutional: She is oriented to person, place, and time. She appears well-developed and well-nourished. No distress.  HENT:  Head: Normocephalic and atraumatic.  Right Ear: External ear normal.  Left Ear: External ear normal.  Nose: Nose normal.  Mouth/Throat: Oropharynx is clear and moist.  Eyes: Conjunctivae normal and EOM are normal. Pupils are equal, round, and reactive to light.  Neck: Normal range of motion. Neck supple. No JVD present. No tracheal deviation present. No thyromegaly present.  Cardiovascular: Normal rate, regular rhythm, normal heart sounds and intact distal pulses.   No murmur heard. Pulmonary/Chest: Effort normal and breath sounds normal. She has no wheezes. She exhibits no tenderness.  Abdominal: Soft. Bowel sounds are normal.  Musculoskeletal: She exhibits no edema and no tenderness.  Lymphadenopathy:    She has no cervical adenopathy.  Neurological: She is alert and oriented to person, place, and time. No cranial nerve deficit.  Skin: Skin is warm and dry. She is not diaphoretic.  Psychiatric: She has a normal mood and affect. Her behavior is normal.          Assessment & Plan:  Patient has begun a thiamine supplement and states that she has noticed that that affects her overactive bladder.  The mechanism of action is unclear it is possible that the thiamine may help with the overactive nerve endings in the bladder that are causing the spasm which creates her problems with frequency and leakage. This is certainly a reasonable care. Intervention with symptomatic relief

## 2012-04-19 NOTE — Patient Instructions (Addendum)
Your TSH is fully suppressed indicating that the thyroid medicine we are giving you do show adequate hormone and the brain is not need to tell the thyroid gland to try to produce more hormone since much of the thyroid gland is not functioning that would cause dysfunctional growth of the thyroid or a goiter. The amount of free T3 and free T4 is in normal range indicating we are giving you just the right amount  Follow your blood pressure for me at home and if it is consistently above 140/90 contact my office

## 2012-04-25 ENCOUNTER — Other Ambulatory Visit: Payer: Self-pay | Admitting: Internal Medicine

## 2012-04-25 MED ORDER — LEVOTHYROXINE SODIUM 150 MCG PO TABS: 150.0000 ug | ORAL_TABLET | Freq: Every day | ORAL | Status: DC

## 2012-04-25 NOTE — Telephone Encounter (Signed)
Pt would prefer name brand SYNTHROID, rather than  levothyroxine . Pt says she thinks MD wants her on name brand.  She did not pick up script b/c of that reason.  She can be reached at [phone]

## 2012-04-25 NOTE — Telephone Encounter (Signed)
Dr Lovell Sheehan had given her samples of name brand synthroid and she wants to stay on it until he does more lab work- name brand will be called in

## 2012-05-12 ENCOUNTER — Ambulatory Visit (INDEPENDENT_AMBULATORY_CARE_PROVIDER_SITE_OTHER): Payer: Medicare Other

## 2012-05-12 DIAGNOSIS — J309 Allergic rhinitis, unspecified: Secondary | ICD-10-CM

## 2012-06-19 ENCOUNTER — Other Ambulatory Visit: Payer: Self-pay | Admitting: Internal Medicine

## 2012-06-19 MED ORDER — LISDEXAMFETAMINE DIMESYLATE 40 MG PO CAPS: 40.0000 mg | ORAL_CAPSULE | ORAL | Status: DC

## 2012-06-19 NOTE — Telephone Encounter (Signed)
rx's up front for p/u, pt aware 

## 2012-06-19 NOTE — Telephone Encounter (Signed)
Pt needs refill of lisdexamfetamine (VYVANSE) 40 MG capsule.   Would like to pick up tomorrow.

## 2012-07-24 ENCOUNTER — Ambulatory Visit (INDEPENDENT_AMBULATORY_CARE_PROVIDER_SITE_OTHER): Payer: Medicare Other | Admitting: Internal Medicine

## 2012-07-24 ENCOUNTER — Encounter: Payer: Self-pay | Admitting: Internal Medicine

## 2012-07-24 VITALS — BP 140/89 | HR 88 | Temp 98.5°F | Wt 136.0 lb

## 2012-07-24 DIAGNOSIS — IMO0001 Reserved for inherently not codable concepts without codable children: Secondary | ICD-10-CM

## 2012-07-24 DIAGNOSIS — K219 Gastro-esophageal reflux disease without esophagitis: Secondary | ICD-10-CM

## 2012-07-24 DIAGNOSIS — I10 Essential (primary) hypertension: Secondary | ICD-10-CM

## 2012-07-24 DIAGNOSIS — M545 Low back pain: Secondary | ICD-10-CM

## 2012-07-24 DIAGNOSIS — E559 Vitamin D deficiency, unspecified: Secondary | ICD-10-CM

## 2012-07-24 DIAGNOSIS — E039 Hypothyroidism, unspecified: Secondary | ICD-10-CM

## 2012-07-24 LAB — CBC WITH DIFFERENTIAL/PLATELET
Basophils Absolute: 0 10*3/uL (ref 0.0–0.1)
Eosinophils Absolute: 0.1 10*3/uL (ref 0.0–0.7)
Hemoglobin: 13.1 g/dL (ref 12.0–15.0)
Lymphocytes Relative: 22.2 % (ref 12.0–46.0)
MCHC: 33.6 g/dL (ref 30.0–36.0)
Monocytes Relative: 5.6 % (ref 3.0–12.0)
Neutrophils Relative %: 69.9 % (ref 43.0–77.0)
Platelets: 189 10*3/uL (ref 150.0–400.0)
RDW: 13.5 % (ref 11.5–14.6)

## 2012-07-24 LAB — BASIC METABOLIC PANEL
CO2: 28 mEq/L (ref 19–32)
Calcium: 9.6 mg/dL (ref 8.4–10.5)
Creatinine, Ser: 0.6 mg/dL (ref 0.4–1.2)
GFR: 102.33 mL/min (ref >60.00)
Sodium: 143 mEq/L (ref 135–145)

## 2012-07-24 LAB — TSH: TSH: 0.04 u[IU]/mL — ABNORMAL LOW (ref 0.35–5.50)

## 2012-07-24 LAB — T4, FREE: Free T4: 1.81 ng/dL — ABNORMAL HIGH (ref 0.60–1.60)

## 2012-07-24 LAB — T3, FREE: T3, Free: 3.5 pg/mL (ref 2.3–4.2)

## 2012-07-24 NOTE — Progress Notes (Signed)
  Subjective:    Patient ID: [name], female    DOB: 07-15-1940, 72 y.o.   MRN: [id number]  HPI Increased fibromyalgia pain Weight stable Blood pressure elevated ADD/ depression and mild memory disorder   Review of Systems  Constitutional: Negative for activity change, appetite change and fatigue.  HENT: Negative for ear pain, congestion, neck pain, postnasal drip and sinus pressure.   Eyes: Negative for redness and visual disturbance.  Respiratory: Negative for cough, shortness of breath and wheezing.   Gastrointestinal: Negative for abdominal pain and abdominal distention.  Genitourinary: Negative for dysuria, frequency and menstrual problem.  Musculoskeletal: Positive for myalgias, back pain, joint swelling and arthralgias.  Skin: Negative for rash and wound.  Neurological: Negative for dizziness, weakness and headaches.  Hematological: Negative for adenopathy. Does not bruise/bleed easily.  Psychiatric/Behavioral: Negative for sleep disturbance and decreased concentration.       Objective:   Physical Exam  Constitutional: She is oriented to person, place, and time. She appears well-developed and well-nourished. No distress.  HENT:  Head: Normocephalic and atraumatic.  Right Ear: External ear normal.  Left Ear: External ear normal.  Nose: Nose normal.  Mouth/Throat: Oropharynx is clear and moist.  Eyes: Conjunctivae and EOM are normal. Pupils are equal, round, and reactive to light.  Neck: Normal range of motion. Neck supple. No JVD present. No tracheal deviation present. No thyromegaly present.  Cardiovascular: Normal rate, regular rhythm and intact distal pulses.   Murmur heard. Pulmonary/Chest: Effort normal and breath sounds normal. She has no wheezes. She exhibits no tenderness.  Abdominal: Soft. Bowel sounds are normal.  Musculoskeletal: Normal range of motion. She exhibits edema and tenderness.  Lymphadenopathy:    She has no cervical adenopathy.   Neurological: She is alert and oriented to person, place, and time. She displays abnormal reflex. No cranial nerve deficit.  Skin: Skin is warm and dry. She is not diaphoretic.  Psychiatric: She has a normal mood and affect. Her behavior is normal.          Assessment & Plan:  Stress and fibromyalgia pain Complicated by need for pain medications Has added vitamin e Monitoring for hypothyroidism

## 2012-07-24 NOTE — Patient Instructions (Signed)
The patient is instructed to continue all medications as prescribed. Schedule followup with check out clerk upon leaving the clinic  

## 2012-07-24 NOTE — Addendum Note (Signed)
Addended by: Bonnye Fava on: 07/24/2012 02:32 PM   Modules accepted: Orders

## 2012-07-25 LAB — VITAMIN D 25 HYDROXY (VIT D DEFICIENCY, FRACTURES): Vit D, 25-Hydroxy: 40 ng/mL (ref 30–89)

## 2012-08-03 ENCOUNTER — Other Ambulatory Visit: Payer: Self-pay | Admitting: *Deleted

## 2012-08-03 MED ORDER — HYDROCODONE-ACETAMINOPHEN 10-500 MG PO TABS: ORAL_TABLET | ORAL | Status: DC

## 2012-08-21 ENCOUNTER — Other Ambulatory Visit: Payer: Self-pay | Admitting: *Deleted

## 2012-08-21 MED ORDER — LEVOTHYROXINE SODIUM 137 MCG PO CAPS: 1.0000 | ORAL_CAPSULE | Freq: Every day | ORAL | Status: DC

## 2012-09-05 ENCOUNTER — Telehealth: Payer: Self-pay | Admitting: Internal Medicine

## 2012-09-05 MED ORDER — LISDEXAMFETAMINE DIMESYLATE 40 MG PO CAPS: 40.0000 mg | ORAL_CAPSULE | ORAL | Status: DC

## 2012-09-05 NOTE — Telephone Encounter (Signed)
Pt requesting refill for (3) 30 day rx for her lisdexamfetamine (VYVANSE) 40 MG capsule Husband would like to pick up Thursday

## 2012-09-05 NOTE — Telephone Encounter (Signed)
Printed and will call pt to pick up after dr jenkins signs 

## 2012-09-20 ENCOUNTER — Ambulatory Visit (INDEPENDENT_AMBULATORY_CARE_PROVIDER_SITE_OTHER): Payer: Medicare Other

## 2012-09-20 DIAGNOSIS — J309 Allergic rhinitis, unspecified: Secondary | ICD-10-CM

## 2012-10-23 ENCOUNTER — Other Ambulatory Visit: Payer: Self-pay | Admitting: Internal Medicine

## 2012-10-31 ENCOUNTER — Ambulatory Visit (INDEPENDENT_AMBULATORY_CARE_PROVIDER_SITE_OTHER): Payer: Medicare Other | Admitting: Internal Medicine

## 2012-10-31 ENCOUNTER — Encounter: Payer: Self-pay | Admitting: Internal Medicine

## 2012-10-31 VITALS — BP 122/80 | HR 99 | Ht 61.0 in | Wt 138.8 lb

## 2012-10-31 DIAGNOSIS — J301 Allergic rhinitis due to pollen: Secondary | ICD-10-CM

## 2012-10-31 NOTE — Patient Instructions (Addendum)
We have stopped allergy vaccine.   We can always look at options if you have problems- please call as needed

## 2012-10-31 NOTE — Progress Notes (Signed)
Subjective:    Patient ID: [name], female    DOB: 1940-12-08, 72 y.o.   MRN: [id number]  HPI  10/30/10- 60 yoF followed for allergic rhinitis Last here December 15, 2009. Coming today for allergy skin testing. Has continued allergy vaccine at 1:50. from initial testing in 2011.She was having some local reactions to allergy vaccine. Last injection was 10/22/10- separate vials until the last 2 weeks.  Main c/o is nasal congestion which seems exhausting. Uses a decongestant nose spray each night at bedtime so she won't snore. Otherwise uses saline. Denies wheeze or chest tightness.  [place]. Doesn't feel well taking antihistamines.  Skin test-  Numerous mild reactions- some differences with more included compared with 2011 testing.  11/01/11- 70 yoF  never smoker followed for allergic rhinitis Retested allergy vaccine 2012. Can not tell any difference with being on vaccine; still having allergy flare ups(consistent) Insufficient help at 1:50. Air conditioner vent blows right over her head at night. [place] discussed both these issues. Ears keep stopping up.  10/31/12- 71 yoF  never smoker followed for allergic rhinitis/ stopped vaccine FOLLOWS FOR: patient states she has not used her vaccine since May-feels better off of it.  Strong odors bother her- irritant pattern. Continues prednisone 5 mg/ day for fibromyalgia. Feels fine now. 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 No active pulmonary disease.  Original Report Authenticated By: Marlon Pel, M.D.  ROS-see HPI Constitutional:   No-   weight loss, night sweats, fevers, chills, fatigue, lassitude. HEENT:   No-  headaches, difficulty swallowing, tooth/dental problems, sore throat,       +sneezing, itching, ear ache, nasal congestion, post nasal drip,  CV:  No-  chest pain, orthopnea, PND, swelling in lower extremities, anasarca,  dizziness, palpitations Resp: No-    shortness of breath with exertion or at rest.              No-   productive cough,  No non-productive cough,  No- coughing up of blood.              No-   change in color of mucus.  No- wheezing.   Skin: No-   rash or lesions. GI:  No-   heartburn, indigestion, abdominal pain, nausea, vomiting,  GU: N MS:  No-   joint pain or swelling.   Neuro-     nothing unusual Psych:  No- change in mood or affect. No depression or anxiety.  No memory loss.  OBJ- Physical Exam General- Alert, Oriented, Affect-appropriate, Distress- none acute Skin- rash-none, lesions- none, excoriation- none Lymphadenopathy- none Head- atraumatic            Eyes- Gross vision intact, PERRLA, conjunctivae and secretions clear            Ears- Hearing, canals-nonocclusive cerumen            Nose- Clear, no-Septal dev, mucus, polyps, erosion, perforation             Throat- Mallampati II , mucosa clear , drainage- none, tonsils- atrophic Neck- flexible , trachea midline, no stridor , thyroid nl, carotid no bruit Chest - symmetrical excursion , unlabored           Heart/CV- RRR , no murmur , no gallop  , no rub, nl s1 s2                           -  JVD- none , edema- none, stasis changes- none, varices- none           Lung- clear to P&A, wheeze- none, cough- none , dullness-none, rub- none           Chest wall-  Abd-  Br/ Gen/ Rectal- Not done, not indicated Extrem- cyanosis- none, clubbing, none, atrophy- none, strength- nl Neuro- grossly intact to observation     

## 2012-11-15 NOTE — Assessment & Plan Note (Signed)
She is describing mostly an irritant response pattern now.  P- Stay off vaccine. Watch need for symptomatic treatment. We can see here again as needed.

## 2012-11-24 ENCOUNTER — Ambulatory Visit (INDEPENDENT_AMBULATORY_CARE_PROVIDER_SITE_OTHER): Payer: Medicare Other | Admitting: Internal Medicine

## 2012-11-24 ENCOUNTER — Encounter: Payer: Self-pay | Admitting: Internal Medicine

## 2012-11-24 ENCOUNTER — Other Ambulatory Visit: Payer: Self-pay | Admitting: *Deleted

## 2012-11-24 VITALS — BP 130/74 | HR 88 | Temp 98.2°F | Resp 16 | Ht 61.0 in | Wt 138.0 lb

## 2012-11-24 DIAGNOSIS — M4040 Postural lordosis, site unspecified: Secondary | ICD-10-CM

## 2012-11-24 DIAGNOSIS — M4055 Lordosis, unspecified, thoracolumbar region: Secondary | ICD-10-CM

## 2012-11-24 DIAGNOSIS — E039 Hypothyroidism, unspecified: Secondary | ICD-10-CM

## 2012-11-24 MED ORDER — PREDNISONE 5 MG PO TABS: 5.0000 mg | ORAL_TABLET | Freq: Every day | ORAL | Status: DC

## 2012-11-24 MED ORDER — LISDEXAMFETAMINE DIMESYLATE 40 MG PO CAPS: 40.0000 mg | ORAL_CAPSULE | ORAL | Status: DC

## 2012-11-24 MED ORDER — LEVOTHYROXINE SODIUM 125 MCG PO TABS: 125.0000 ug | ORAL_TABLET | Freq: Every day | ORAL | Status: DC

## 2012-11-24 NOTE — Progress Notes (Signed)
Subjective:    Patient ID: [name], female    DOB: April 26, 1941, 72 y.o.   MRN: [id number]  HPI Follow up ADD medications and treatment plan Has been doing better.... Spine and shoulder... Her husband is concerned with activity Considering PT    Review of Systems  Constitutional: Negative for activity change, appetite change and fatigue.  HENT: Negative for ear pain, congestion, neck pain, postnasal drip and sinus pressure.   Eyes: Negative for redness and visual disturbance.  Respiratory: Negative for cough, shortness of breath and wheezing.   Gastrointestinal: Negative for abdominal pain and abdominal distention.  Genitourinary: Negative for dysuria, frequency and menstrual problem.  Musculoskeletal: Negative for myalgias, joint swelling and arthralgias.  Skin: Negative for rash and wound.  Neurological: Negative for dizziness, weakness and headaches.  Hematological: Negative for adenopathy. Does not bruise/bleed easily.  Psychiatric/Behavioral: Negative for sleep disturbance and decreased concentration.   Past Medical History  Diagnosis Date  . Allergy   . GERD (gastroesophageal reflux disease)   . Thyroid disease   . Fibromyalgia   . Headache(784.0)   . Fibromyalgia   . Urinary incontinence   . Degenerative disk disease     History   Social History  . Marital Status: Married    Spouse Name: N/A    Number of Children: N/A  . Years of Education: N/A   Occupational History  . retired    Social History Main Topics  . Smoking status: Never Smoker   . Smokeless tobacco: Not on file  . Alcohol Use: No  . Drug Use: No  . Sexually Active: Yes   Other Topics Concern  . Not on file   Social History Narrative  . No narrative on file    Past Surgical History  Procedure Laterality Date  . Abdominal hysterectomy    . Total abdominal hysterectomy  1983  . Colonoscopy  01/27/2004  . Total knee arthroplasty  2008/2011    both knees  . Rectocele repair    .  Rotator cuff repair      Family History  Problem Relation Age of Onset  . Colon cancer    . Irritable bowel syndrome    . Dementia Mother     Allergies  Allergen Reactions  . Terbinafine Hcl Hives  . Prolia (Denosumab)     rash    Current Outpatient Prescriptions on File Prior to Visit  Medication Sig Dispense Refill  . BENFOTIAMINE PO Take 500 mg by mouth 2 (two) times daily.       . bisacodyl (DULCOLAX) 5 MG EC tablet Take 5 mg by mouth at bedtime as needed.        . cyanocobalamin (,VITAMIN B-12,) 1000 MCG/ML injection Inject 1 mL (1,000 mcg total) into the muscle every 6 (six) weeks.  10 mL  6  . folic acid (FOLVITE) 1 MG tablet Take 1 mg by mouth. 1tab 3 times weekly      . HYDROcodone-acetaminophen (LORTAB) 10-500 MG per tablet TAKE 1 TO 1 & 1/2 TABLETS EVERY 6 HOURS AS NEEDED FOR PAIN.  75 tablet  2  . Levothyroxine Sodium 137 MCG CAPS Take 1 capsule (137 mcg total) by mouth daily before breakfast.  30 capsule  11  . Multiple Vitamins-Minerals (OCUVITE ADULT FORMULA) CAPS Take 1 capsule by mouth daily.       . multivitamin (THERAGRAN) per tablet Take 1 tablet by mouth daily.       . SUMAtriptan (IMITREX) 100 MG tablet TAKE  1 TABLET FOR MIGRAINES.  9 tablet  3  . zolpidem (AMBIEN) 10 MG tablet Take 1 tablet (10 mg total) by mouth at bedtime as needed for sleep.  30 tablet  3   No current facility-administered medications on file prior to visit.    BP 130/74  Pulse 88  Temp(Src) 98.2 F (36.8 C)  Resp 16  Ht 5\' 1"  (1.549 m)  Wt 138 lb (62.596 kg)  BMI 26.09 kg/m2       Objective:   Physical Exam  Constitutional: She appears well-developed and well-nourished.  HENT:  Head: Normocephalic and atraumatic.  Eyes: Conjunctivae are normal. Pupils are equal, round, and reactive to light.  Neck: Normal range of motion. Neck supple.  Cardiovascular: Normal rate.   Pulmonary/Chest: Effort normal and breath sounds normal.  Abdominal: Soft. Bowel sounds are normal.   Musculoskeletal:  lordosis          Assessment & Plan:  Has increased lordosis and shoulder pain Lordosis Her use of the benfotiamine has helped her neuropathy Head aches stable Vit E helping memory Due thyroid follow up

## 2012-12-25 ENCOUNTER — Ambulatory Visit: Payer: Medicare Other | Admitting: Internal Medicine

## 2012-12-26 ENCOUNTER — Other Ambulatory Visit: Payer: Self-pay | Admitting: *Deleted

## 2012-12-26 ENCOUNTER — Encounter: Payer: Self-pay | Admitting: Internal Medicine

## 2012-12-26 DIAGNOSIS — E039 Hypothyroidism, unspecified: Secondary | ICD-10-CM

## 2012-12-27 ENCOUNTER — Telehealth: Payer: Self-pay | Admitting: Internal Medicine

## 2012-12-27 NOTE — Telephone Encounter (Signed)
Pt request rx lisdexamfetamine (VYVANSE) 40 MG capsule 90 refill.  Pt would like to pick up at her lab appt on Friday if possible.

## 2012-12-28 NOTE — Telephone Encounter (Signed)
Will be ready and pt informedf

## 2012-12-29 ENCOUNTER — Other Ambulatory Visit (INDEPENDENT_AMBULATORY_CARE_PROVIDER_SITE_OTHER): Payer: Medicare Other

## 2012-12-29 ENCOUNTER — Other Ambulatory Visit: Payer: Self-pay | Admitting: *Deleted

## 2012-12-29 DIAGNOSIS — E039 Hypothyroidism, unspecified: Secondary | ICD-10-CM

## 2012-12-29 MED ORDER — LISDEXAMFETAMINE DIMESYLATE 40 MG PO CAPS: 40.0000 mg | ORAL_CAPSULE | ORAL | Status: DC

## 2013-01-05 ENCOUNTER — Encounter: Payer: Self-pay | Admitting: Internal Medicine

## 2013-01-26 ENCOUNTER — Encounter: Payer: Self-pay | Admitting: Internal Medicine

## 2013-03-05 ENCOUNTER — Encounter: Payer: Self-pay | Admitting: Internal Medicine

## 2013-03-05 ENCOUNTER — Ambulatory Visit (INDEPENDENT_AMBULATORY_CARE_PROVIDER_SITE_OTHER): Payer: Medicare Other | Admitting: Internal Medicine

## 2013-03-05 VITALS — BP 130/70 | HR 104 | Temp 98.2°F | Resp 16 | Ht 61.0 in | Wt 138.0 lb

## 2013-03-05 DIAGNOSIS — Z23 Encounter for immunization: Secondary | ICD-10-CM

## 2013-03-05 DIAGNOSIS — M898X9 Other specified disorders of bone, unspecified site: Secondary | ICD-10-CM

## 2013-03-05 DIAGNOSIS — E538 Deficiency of other specified B group vitamins: Secondary | ICD-10-CM

## 2013-03-05 DIAGNOSIS — M797 Fibromyalgia: Secondary | ICD-10-CM

## 2013-03-05 DIAGNOSIS — R5381 Other malaise: Secondary | ICD-10-CM

## 2013-03-05 DIAGNOSIS — M899 Disorder of bone, unspecified: Secondary | ICD-10-CM

## 2013-03-05 DIAGNOSIS — IMO0001 Reserved for inherently not codable concepts without codable children: Secondary | ICD-10-CM

## 2013-03-05 DIAGNOSIS — E039 Hypothyroidism, unspecified: Secondary | ICD-10-CM

## 2013-03-05 MED ORDER — LISDEXAMFETAMINE DIMESYLATE 40 MG PO CAPS: 40.0000 mg | ORAL_CAPSULE | ORAL | Status: DC

## 2013-03-05 MED ORDER — CYANOCOBALAMIN 1000 MCG/ML IJ SOLN: 1000.0000 ug | INTRAMUSCULAR | Status: DC

## 2013-03-05 MED ORDER — EFINACONAZOLE 10 % EX SOLN: 1.0000 "application " | Freq: Every day | CUTANEOUS | Status: DC

## 2013-03-05 MED ORDER — GABAPENTIN 100 MG PO CAPS: 100.0000 mg | ORAL_CAPSULE | Freq: Every evening | ORAL | Status: DC | PRN

## 2013-03-05 NOTE — Patient Instructions (Signed)
The patient is instructed to continue all medications as prescribed. Schedule followup with check out clerk upon leaving the clinic  

## 2013-03-05 NOTE — Progress Notes (Signed)
Subjective:    Patient ID: [name], female    DOB: 11-05-1940, 72 y.o.   MRN: [id number]  HPI The patient presents for follow up of fibromyagia and increased pain in "bones" HTN stable GERD stable Tendonitis in  Multiple sites Hip, groin and shoulders History of neuropathy and increased awakening with stinging Increased use of pain medications    Review of Systems  Constitutional: Positive for fatigue.  HENT: Positive for hearing loss and sinus pressure.   Respiratory: Negative.   Cardiovascular: Negative.   Gastrointestinal: Negative.   Genitourinary: Positive for frequency.  Neurological: Positive for tremors and light-headedness.  Hematological: Positive for adenopathy. Bruises/bleeds easily.  Psychiatric/Behavioral: Negative for confusion and decreased concentration.   Past Medical History  Diagnosis Date  . Allergy   . GERD (gastroesophageal reflux disease)   . Thyroid disease   . Fibromyalgia   . Headache(784.0)   . Fibromyalgia   . Urinary incontinence   . Degenerative disk disease     History   Social History  . Marital Status: Married    Spouse Name: N/A    Number of Children: N/A  . Years of Education: N/A   Occupational History  . retired    Social History Main Topics  . Smoking status: Never Smoker   . Smokeless tobacco: Not on file  . Alcohol Use: No  . Drug Use: No  . Sexual Activity: Yes   Other Topics Concern  . Not on file   Social History Narrative  . No narrative on file    Past Surgical History  Procedure Laterality Date  . Abdominal hysterectomy    . Total abdominal hysterectomy  1983  . Colonoscopy  01/27/2004  . Total knee arthroplasty  2008/2011    both knees  . Rectocele repair    . Rotator cuff repair      Family History  Problem Relation Age of Onset  . Colon cancer    . Irritable bowel syndrome    . Dementia Mother     Allergies  Allergen Reactions  . Terbinafine Hcl Hives  . Prolia [Denosumab]    rash    Current Outpatient Prescriptions on File Prior to Visit  Medication Sig Dispense Refill  . BENFOTIAMINE PO Take 500 mg by mouth 2 (two) times daily.       . bisacodyl (DULCOLAX) 5 MG EC tablet Take 5 mg by mouth at bedtime as needed.        . folic acid (FOLVITE) 1 MG tablet Take 1 mg by mouth. 1tab 3 times weekly      . HYDROcodone-acetaminophen (LORTAB) 10-500 MG per tablet TAKE 1 TO 1 & 1/2 TABLETS EVERY 6 HOURS AS NEEDED FOR PAIN.  75 tablet  2  . levothyroxine (SYNTHROID, LEVOTHROID) 125 MCG tablet Take 1 tablet (125 mcg total) by mouth daily.  90 tablet  3  . Multiple Vitamins-Minerals (OCUVITE ADULT FORMULA) CAPS Take 1 capsule by mouth daily.       . multivitamin (THERAGRAN) per tablet Take 1 tablet by mouth daily.       . predniSONE (DELTASONE) 5 MG tablet Take 1 tablet (5 mg total) by mouth daily.  30 tablet  6  . SUMAtriptan (IMITREX) 100 MG tablet TAKE 1 TABLET FOR MIGRAINES.  9 tablet  3  . vitamin E 400 UNIT capsule Take 400 Units by mouth daily.      Marland Kitchen zolpidem (AMBIEN) 10 MG tablet Take 1 tablet (10 mg total) by mouth  at bedtime as needed for sleep.  30 tablet  3   No current facility-administered medications on file prior to visit.    BP 130/70  Pulse 104  Temp(Src) 98.2 F (36.8 C)  Resp 16  Ht 5\' 1"  (1.549 m)  Wt 138 lb (62.596 kg)  BMI 26.09 kg/m2       Objective:   Physical Exam  Constitutional: She is oriented to person, place, and time. She appears well-developed and well-nourished.  Cardiovascular: Normal rate and regular rhythm.   Murmur heard. Pulmonary/Chest: Breath sounds normal.  Abdominal: Soft. Bowel sounds are normal.  Musculoskeletal: Normal range of motion. She exhibits edema and tenderness.  Neurological: She is alert and oriented to person, place, and time.  Skin: Skin is warm and dry.  Psychiatric: She has a normal mood and affect. Her behavior is normal.          Assessment & Plan:  Patient has a history of fibromyalgia  for deep bone pain we need to evaluate her for parathyroid disease for arteritis as a possible expiration other than fibromyalgia for her pain syndrome this could be a flare of her fibromyalgia and we will try gabapentin 100 mg at bedtime increasing to 100 twice a day as a trial to see if we can minimize the use of narcotic pain control.  We will also look at her basic metabolic panel to make sure that renal function and liver function are normal on her current medications we'll look at a CBC differential to make sure that her B12 levels and anemia has not worsened we will look at a B12 level to make sure that her B12 level is maximal in that she has had some worsening of her peripheral neuropathy.

## 2013-03-05 NOTE — Addendum Note (Signed)
Addended by: Stacie Glaze on: 03/05/2013 04:00 PM   Modules accepted: Orders

## 2013-03-06 LAB — COMPREHENSIVE METABOLIC PANEL
ALT: 17 U/L (ref 0–35)
Albumin: 4.4 g/dL (ref 3.5–5.2)
CO2: 27 mEq/L (ref 19–32)
Calcium: 9.6 mg/dL (ref 8.4–10.5)
Chloride: 100 mEq/L (ref 96–112)
Creatinine, Ser: 0.7 mg/dL (ref 0.4–1.2)
GFR: 85.74 mL/min (ref >60.00)
Glucose, Bld: 105 mg/dL — ABNORMAL HIGH (ref 70–99)
Potassium: 3.5 mEq/L (ref 3.5–5.1)
Total Bilirubin: 0.6 mg/dL (ref 0.3–1.2)
Total Protein: 6.7 g/dL (ref 6.0–8.3)

## 2013-03-06 LAB — CBC WITH DIFFERENTIAL/PLATELET
Basophils Absolute: 0 10*3/uL (ref 0.0–0.1)
Eosinophils Relative: 0.5 % (ref 0.0–5.0)
HCT: 40.1 % (ref 36.0–46.0)
Hemoglobin: 13.6 g/dL (ref 12.0–15.0)
Lymphocytes Relative: 13.8 % (ref 12.0–46.0)
Lymphs Abs: 1 10*3/uL (ref 0.7–4.0)
Monocytes Relative: 4.6 % (ref 3.0–12.0)
Neutro Abs: 5.8 10*3/uL (ref 1.4–7.7)
RBC: 4.34 Mil/uL (ref 3.87–5.11)
RDW: 14.1 % (ref 11.5–14.6)
WBC: 7.1 10*3/uL (ref 4.5–10.5)

## 2013-03-06 LAB — VITAMIN B12: Vitamin B-12: 1197 pg/mL — ABNORMAL HIGH (ref 211–911)

## 2013-03-07 LAB — PTH, INTACT AND CALCIUM: Calcium: 10 mg/dL (ref 8.4–10.5)

## 2013-03-09 ENCOUNTER — Other Ambulatory Visit: Payer: Self-pay | Admitting: Internal Medicine

## 2013-06-11 ENCOUNTER — Encounter: Payer: Self-pay | Admitting: Internal Medicine

## 2013-06-12 ENCOUNTER — Encounter: Payer: Self-pay | Admitting: Internal Medicine

## 2013-06-13 ENCOUNTER — Other Ambulatory Visit: Payer: Self-pay | Admitting: *Deleted

## 2013-06-13 ENCOUNTER — Encounter: Payer: Self-pay | Admitting: *Deleted

## 2013-06-15 MED ORDER — LISDEXAMFETAMINE DIMESYLATE 40 MG PO CAPS: 40.0000 mg | ORAL_CAPSULE | ORAL | Status: DC

## 2013-06-25 ENCOUNTER — Other Ambulatory Visit: Payer: Self-pay | Admitting: Internal Medicine

## 2013-07-13 ENCOUNTER — Other Ambulatory Visit: Payer: Self-pay | Admitting: *Deleted

## 2013-07-13 ENCOUNTER — Encounter: Payer: Self-pay | Admitting: Internal Medicine

## 2013-07-13 MED ORDER — HYDROCODONE-ACETAMINOPHEN 10-325 MG PO TABS: ORAL_TABLET | ORAL | Status: DC

## 2013-07-27 ENCOUNTER — Encounter: Payer: Self-pay | Admitting: *Deleted

## 2013-07-27 ENCOUNTER — Encounter: Payer: Self-pay | Admitting: Internal Medicine

## 2013-07-27 ENCOUNTER — Ambulatory Visit (INDEPENDENT_AMBULATORY_CARE_PROVIDER_SITE_OTHER): Payer: Medicare Other | Admitting: Internal Medicine

## 2013-07-27 VITALS — BP 120/70 | HR 98 | Temp 97.5°F | Ht 61.0 in | Wt 137.0 lb

## 2013-07-27 DIAGNOSIS — G4700 Insomnia, unspecified: Secondary | ICD-10-CM

## 2013-07-27 DIAGNOSIS — E039 Hypothyroidism, unspecified: Secondary | ICD-10-CM

## 2013-07-27 DIAGNOSIS — M899 Disorder of bone, unspecified: Secondary | ICD-10-CM

## 2013-07-27 DIAGNOSIS — M949 Disorder of cartilage, unspecified: Secondary | ICD-10-CM

## 2013-07-27 DIAGNOSIS — E538 Deficiency of other specified B group vitamins: Secondary | ICD-10-CM

## 2013-07-27 DIAGNOSIS — M8580 Other specified disorders of bone density and structure, unspecified site: Secondary | ICD-10-CM

## 2013-07-27 MED ORDER — HYDROCODONE-ACETAMINOPHEN 10-325 MG PO TABS: ORAL_TABLET | ORAL | Status: DC

## 2013-07-27 MED ORDER — HYDROCODONE-ACETAMINOPHEN 10-500 MG PO TABS: ORAL_TABLET | ORAL | Status: DC

## 2013-07-27 MED ORDER — PREDNISONE 5 MG PO TABS: 5.0000 mg | ORAL_TABLET | Freq: Every day | ORAL | Status: DC

## 2013-07-27 MED ORDER — LISDEXAMFETAMINE DIMESYLATE 40 MG PO CAPS: 40.0000 mg | ORAL_CAPSULE | ORAL | Status: DC

## 2013-07-27 MED ORDER — ZOLPIDEM TARTRATE 10 MG PO TABS: 10.0000 mg | ORAL_TABLET | Freq: Every evening | ORAL | Status: DC | PRN

## 2013-07-27 NOTE — Addendum Note (Signed)
Addended by: Rita OharaHRASHER, Romy Ipock R on: 07/27/2013 04:20 PM   Modules accepted: Orders

## 2013-07-27 NOTE — Progress Notes (Signed)
Pre visit review using our clinic review tool, if applicable. No additional management support is needed unless otherwise documented below in the visit note. 

## 2013-07-27 NOTE — Patient Instructions (Signed)
The patient is instructed to continue all medications as prescribed. Schedule followup with check out clerk upon leaving the clinic  

## 2013-07-27 NOTE — Progress Notes (Signed)
   Subjective:    Patient ID: [name], female    DOB: 08/12/1940, 73 y.o.   MRN: [id number]  HPI Follow up for fibromyalgia    Review of Systems  Constitutional: Negative for activity change, appetite change and fatigue.  HENT: Negative for congestion, ear pain, postnasal drip and sinus pressure.   Eyes: Negative for redness and visual disturbance.  Respiratory: Negative for cough, shortness of breath and wheezing.   Gastrointestinal: Negative for abdominal pain and abdominal distention.  Genitourinary: Negative for dysuria, frequency and menstrual problem.  Musculoskeletal: Negative for arthralgias, joint swelling, myalgias and neck pain.  Skin: Negative for rash and wound.  Neurological: Negative for dizziness, weakness and headaches.  Hematological: Negative for adenopathy. Does not bruise/bleed easily.  Psychiatric/Behavioral: Negative for sleep disturbance and decreased concentration.       Objective:   Physical Exam  Nursing note and vitals reviewed. Constitutional: She is oriented to person, place, and time. She appears well-developed and well-nourished. No distress.  HENT:  Head: Normocephalic and atraumatic.  Eyes: Conjunctivae and EOM are normal. Pupils are equal, round, and reactive to light.  Neck: Normal range of motion. Neck supple. No JVD present. No tracheal deviation present. No thyromegaly present.  Cardiovascular: Normal rate, regular rhythm and intact distal pulses.   Murmur heard. Pulmonary/Chest: Effort normal and breath sounds normal. She has no wheezes. She exhibits no tenderness.  Abdominal: Soft. Bowel sounds are normal.  Musculoskeletal: Normal range of motion. She exhibits no edema and no tenderness.  Lymphadenopathy:    She has no cervical adenopathy.  Neurological: She is alert and oriented to person, place, and time. She has normal reflexes. No cranial nerve deficit.  Skin: Skin is warm and dry. She is not diaphoretic.  Psychiatric: She  has a normal mood and affect. Her behavior is normal.          Assessment & Plan:  Has AK and has been given  A topical for skin peel Had a root canal and has persistent pain  Has been using extra HC  Fibro pain stable  B 12 shots and due level

## 2013-07-28 LAB — TSH: TSH: 0.101 u[IU]/mL — AB (ref 0.350–4.500)

## 2013-07-28 LAB — VITAMIN B12: VITAMIN B 12: 834 pg/mL (ref 211–911)

## 2013-07-28 LAB — T4, FREE: Free T4: 1.66 ng/dL (ref 0.80–1.80)

## 2013-07-28 LAB — VITAMIN D 25 HYDROXY (VIT D DEFICIENCY, FRACTURES): Vit D, 25-Hydroxy: 46 ng/mL (ref 30–89)

## 2013-07-31 ENCOUNTER — Other Ambulatory Visit: Payer: Self-pay | Admitting: Internal Medicine

## 2013-09-11 ENCOUNTER — Encounter: Payer: Self-pay | Admitting: Internal Medicine

## 2013-10-19 ENCOUNTER — Encounter: Payer: Self-pay | Admitting: Internal Medicine

## 2013-10-19 ENCOUNTER — Other Ambulatory Visit: Payer: Self-pay | Admitting: Internal Medicine

## 2013-10-19 MED ORDER — HYDROCODONE-ACETAMINOPHEN 10-325 MG PO TABS: ORAL_TABLET | ORAL | Status: DC

## 2013-10-19 MED ORDER — LISDEXAMFETAMINE DIMESYLATE 40 MG PO CAPS: 40.0000 mg | ORAL_CAPSULE | ORAL | Status: DC

## 2013-10-22 ENCOUNTER — Encounter: Payer: Self-pay | Admitting: Internal Medicine

## 2013-11-23 ENCOUNTER — Encounter: Payer: Self-pay | Admitting: Internal Medicine

## 2013-11-23 ENCOUNTER — Other Ambulatory Visit: Payer: Self-pay | Admitting: Internal Medicine

## 2013-11-23 DIAGNOSIS — E785 Hyperlipidemia, unspecified: Secondary | ICD-10-CM

## 2013-11-23 DIAGNOSIS — E538 Deficiency of other specified B group vitamins: Secondary | ICD-10-CM

## 2013-11-23 DIAGNOSIS — L659 Nonscarring hair loss, unspecified: Secondary | ICD-10-CM

## 2013-11-23 DIAGNOSIS — E039 Hypothyroidism, unspecified: Secondary | ICD-10-CM

## 2013-11-27 NOTE — Addendum Note (Signed)
Addended by: Alfred LevinsWYRICK, CINDY D on: 11/27/2013 10:45 AM   Modules accepted: Orders

## 2013-12-10 ENCOUNTER — Telehealth: Payer: Self-pay | Admitting: Internal Medicine

## 2013-12-10 ENCOUNTER — Other Ambulatory Visit: Payer: Self-pay | Admitting: Internal Medicine

## 2013-12-10 NOTE — Telephone Encounter (Signed)
Pt is needing new rx HYDROcodone-acetaminophen (NORCO) 10-325 MG per tablet, please call when available for pick up. ° °

## 2013-12-11 NOTE — Telephone Encounter (Signed)
Pt needs appt with new provider for refill.  She also needs assured tox.  You can ask Dr. Lovell SheehanJenkins if he will refill until she decides on new PCP

## 2013-12-12 ENCOUNTER — Telehealth: Payer: Self-pay | Admitting: Internal Medicine

## 2013-12-12 MED ORDER — LEVOTHYROXINE SODIUM 125 MCG PO TABS: ORAL_TABLET | ORAL | Status: DC

## 2013-12-12 NOTE — Telephone Encounter (Signed)
Pt has decided on Dr Artist PaisYoo as her new pcp and can not get in to see him until Sept 9. Was told to call if she needed a refill until she can see Dr Artist PaisYoo .

## 2013-12-12 NOTE — Telephone Encounter (Signed)
Pt has decided to go with Dr Artist PaisYoo as her pcp and need an rx on SYNTHROID 125 MCG tablet to this rx will expire on Aug 9  .. She has also requested a rx on HYDROcodone-acetaminophen (NORCO) 10-325 MG per tablet

## 2013-12-12 NOTE — Telephone Encounter (Signed)
rx sent in electronically 

## 2013-12-13 ENCOUNTER — Telehealth: Payer: Self-pay | Admitting: Internal Medicine

## 2013-12-13 ENCOUNTER — Encounter: Payer: Self-pay | Admitting: Internal Medicine

## 2013-12-13 NOTE — Telephone Encounter (Signed)
Pt following up on hydrocodone  Request. Pt will be out on Saturday. Can we get Dr Lovell SheehanJenkins to sign this on Friday early afternoon? [facility] I have scheduled pt w/ Dr Artist PaisYoo to est on thurs, aug 27, so 30 day rx will get them through to her appointment w/ him. Thank you

## 2013-12-14 MED ORDER — HYDROCODONE-ACETAMINOPHEN 10-325 MG PO TABS: ORAL_TABLET | ORAL | Status: DC

## 2013-12-14 NOTE — Telephone Encounter (Signed)
Rx ready for pick up. 

## 2013-12-14 NOTE — Telephone Encounter (Signed)
Husband had called earlier fu on hydrocodone rx. RX has been done

## 2013-12-14 NOTE — Telephone Encounter (Signed)
Left a message for pt that rx is ready for pick up.  Also sent a mychart message.

## 2013-12-14 NOTE — Telephone Encounter (Signed)
Ok to refill 

## 2013-12-26 ENCOUNTER — Other Ambulatory Visit (INDEPENDENT_AMBULATORY_CARE_PROVIDER_SITE_OTHER): Payer: Medicare Other

## 2013-12-26 DIAGNOSIS — L659 Nonscarring hair loss, unspecified: Secondary | ICD-10-CM

## 2013-12-26 DIAGNOSIS — E039 Hypothyroidism, unspecified: Secondary | ICD-10-CM

## 2013-12-26 LAB — T3, FREE: T3, Free: 2.9 pg/mL (ref 2.3–4.2)

## 2013-12-26 LAB — FERRITIN: FERRITIN: 27.4 ng/mL (ref 10.0–291.0)

## 2013-12-26 LAB — IBC PANEL
Iron: 99 ug/dL (ref 42–145)
SATURATION RATIOS: 27 % (ref 20.0–50.0)
Transferrin: 261.9 mg/dL (ref 212.0–360.0)

## 2013-12-26 LAB — T4, FREE: Free T4: 1.46 ng/dL (ref 0.60–1.60)

## 2013-12-26 LAB — TSH: TSH: 0.04 u[IU]/mL — ABNORMAL LOW (ref 0.35–4.50)

## 2013-12-28 ENCOUNTER — Encounter: Payer: Self-pay | Admitting: Internal Medicine

## 2014-01-03 ENCOUNTER — Encounter: Payer: Self-pay | Admitting: Internal Medicine

## 2014-01-10 ENCOUNTER — Ambulatory Visit (INDEPENDENT_AMBULATORY_CARE_PROVIDER_SITE_OTHER): Payer: Medicare Other | Admitting: Internal Medicine

## 2014-01-10 ENCOUNTER — Encounter: Payer: Self-pay | Admitting: Internal Medicine

## 2014-01-10 VITALS — BP 154/91 | HR 92 | Temp 97.6°F | Ht 61.0 in | Wt 134.0 lb

## 2014-01-10 DIAGNOSIS — G894 Chronic pain syndrome: Secondary | ICD-10-CM

## 2014-01-10 DIAGNOSIS — M818 Other osteoporosis without current pathological fracture: Secondary | ICD-10-CM

## 2014-01-10 DIAGNOSIS — E039 Hypothyroidism, unspecified: Secondary | ICD-10-CM

## 2014-01-10 DIAGNOSIS — Z23 Encounter for immunization: Secondary | ICD-10-CM

## 2014-01-10 MED ORDER — GABAPENTIN 100 MG PO CAPS: 100.0000 mg | ORAL_CAPSULE | Freq: Every day | ORAL | Status: DC

## 2014-01-10 MED ORDER — HYDROCODONE-ACETAMINOPHEN 10-325 MG PO TABS: ORAL_TABLET | ORAL | Status: DC

## 2014-01-10 MED ORDER — LEVOTHYROXINE SODIUM 100 MCG PO TABS: ORAL_TABLET | ORAL | Status: DC

## 2014-01-10 NOTE — Progress Notes (Signed)
Subjective:    Patient ID: [name], female    DOB: 12/11/1940, 73 y.o.   MRN: [id number]  HPI  73 year old white female with history of fibromyalgia hypothyroidism, chronic fatigue, scoliosis deformity of spine and osteoarthritis to transfer care.  Patient is poor she has been struggling with chronic pain from fibromyalgia, scoliosis of her spine and osteoarthritis from many years. Her pain control with hydrocodone 10/325 mg. She takes one half tablet at least 3 times a day. Her previous primary physician suggested trial of gabapentin but patient never started medication.  Patient reports she would not be able to perform chores around the house or enjoy gardening without adequate pain control.  Patient also has history of hypothyroidism. She is currently taking 125 mcg. Her TSH is suppressed.  She also has history of osteoporosis. She reports no improvement with oral bisphosphonates and she had side effect from Prolia.  Medication list reviewed in detail with patient.  Review of Systems Generalized fatigue improved over last 6 months.  She denies constipation    Past Medical History  Diagnosis Date  . Allergy   . GERD (gastroesophageal reflux disease)   . Thyroid disease   . Fibromyalgia   . Headache(784.0)   . Fibromyalgia   . Urinary incontinence   . Degenerative disk disease   . Scoliosis deformity of spine   . Osteoarthritis   . Chronic fatigue     History   Social History  . Marital Status: Married    Spouse Name: N/A    Number of Children: N/A  . Years of Education: N/A   Occupational History  . retired    Social History Main Topics  . Smoking status: Never Smoker   . Smokeless tobacco: Not on file  . Alcohol Use: No  . Drug Use: No  . Sexual Activity: Yes   Other Topics Concern  . Not on file   Social History Narrative  . No narrative on file    Past Surgical History  Procedure Laterality Date  . Abdominal hysterectomy    . Total abdominal  hysterectomy  1983  . Colonoscopy  01/27/2004  . Total knee arthroplasty  2008/2011    both knees  . Rectocele repair    . Rotator cuff repair      Family History  Problem Relation Age of Onset  . Colon cancer    . Irritable bowel syndrome    . Dementia Mother     Allergies  Allergen Reactions  . Terbinafine Hcl Hives  . Prolia [Denosumab]     rash    Current Outpatient Prescriptions on File Prior to Visit  Medication Sig Dispense Refill  . BENFOTIAMINE PO Take 500 mg by mouth 2 (two) times daily.       . bisacodyl (DULCOLAX) 5 MG EC tablet Take 5 mg by mouth at bedtime as needed.        . cholecalciferol (VITAMIN D) 1000 UNITS tablet Take 1,000 Units by mouth daily.      . cyanocobalamin (,VITAMIN B-12,) 1000 MCG/ML injection Inject 1 mL (1,000 mcg total) into the muscle every 6 (six) weeks.  10 mL  6  . folic acid (FOLVITE) 1 MG tablet TAKE (1) TABLET DAILY  30 tablet  11  . Multiple Vitamins-Minerals (OCUVITE ADULT FORMULA) CAPS Take 1 capsule by mouth daily.       . multivitamin (THERAGRAN) per tablet Take 1 tablet by mouth daily.       . predniSONE (  DELTASONE) 5 MG tablet Take 1 tablet (5 mg total) by mouth daily.  30 tablet  6  . SUMAtriptan (IMITREX) 100 MG tablet TAKE 1 TABLET BY MOUTH FOR MIGRAINES  9 tablet  6  . zolpidem (AMBIEN) 10 MG tablet TAKE ONE TABLET AT BED- TIME.  30 tablet  3  . Efinaconazole 10 % SOLN Apply 1 application topically daily.  4 mL  11  . vitamin E 400 UNIT capsule Take 400 Units by mouth daily.       No current facility-administered medications on file prior to visit.    BP 154/91  Pulse 92  Temp(Src) 97.6 F (36.4 C) (Oral)  Ht  (1.549 m)  Wt 134 lb (60.782 kg)  BMI 25.33 kg/m2  SpO2 95%     Objective:   Physical Exam  Constitutional: She is oriented to person, place, and time. She appears well-developed and well-nourished. No distress.  HENT:  Head: Normocephalic and atraumatic.  Right Ear: External ear normal.    Mouth/Throat: Oropharynx is clear and moist.  Neck: Neck supple.  Cardiovascular: Normal rate, regular rhythm and normal heart sounds.   No murmur heard. Pulmonary/Chest: Effort normal and breath sounds normal. She has no wheezes.  Abdominal: Soft. Bowel sounds are normal. She exhibits no mass. There is no tenderness.  Musculoskeletal: She exhibits no edema.  Scoliosis of the spine Advanced osteoarthritis of hands with Heberden's and Bouchard's nodes bilaterally  Lymphadenopathy:    She has no cervical adenopathy.  Neurological: She is alert and oriented to person, place, and time. No cranial nerve deficit.  Normal gait  Skin: Skin is warm and dry.  Psychiatric: She has a normal mood and affect. Her behavior is normal.          Assessment & Plan:

## 2014-01-10 NOTE — Assessment & Plan Note (Addendum)
73 year old white female with chronic pain syndrome that is multifactorial. She has significant pain of her spine due to her history of significant spinal scoliosis. She is not a surgical candidate. Patient also has significant generalized osteoarthritis and fibromyalgia. She has taken Vicodin for many years. She has tapered to her current dose of one half tablet of 10/325 3 times a day as needed. We discussed further tapering if possible. Patient encouraged to try gabapentin 100 mg at bedtime.

## 2014-01-10 NOTE — Patient Instructions (Addendum)
Please complete the following lab tests before your next follow up appointment: BMET, FLP, LFTs - 401.9 TSH - 244.9 CBCD - 401.9

## 2014-01-10 NOTE — Assessment & Plan Note (Signed)
Patient over treated.  Decrease levothyroxine dose to 100 mcg once daily.  Lab Results  Component Value Date   TSH 0.04* 12/26/2013

## 2014-01-10 NOTE — Progress Notes (Signed)
Pre visit review using our clinic review tool, if applicable. No additional management support is needed unless otherwise documented below in the visit note. 

## 2014-01-10 NOTE — Assessment & Plan Note (Signed)
Consider restart oral bisphosphonate versus IV ReClast.  She could not tolerate Prolia in the past.  We discussed tapering off prednisone as it is likely exacerbating her osteoporosis.

## 2014-01-11 MED ORDER — CYANOCOBALAMIN 1000 MCG/ML IJ SOLN: 1000.0000 ug | INTRAMUSCULAR | Status: DC

## 2014-01-15 ENCOUNTER — Other Ambulatory Visit: Payer: Self-pay | Admitting: Internal Medicine

## 2014-01-23 ENCOUNTER — Ambulatory Visit: Payer: Medicare Other | Admitting: Internal Medicine

## 2014-01-30 ENCOUNTER — Telehealth: Payer: Self-pay | Admitting: Internal Medicine

## 2014-01-30 NOTE — Telephone Encounter (Signed)
Pt will be in [place] tomorrow and would like to know if her rx for  HYDROcodone-acetaminophen (NORCO) 10-325 MG per tablet can be ready on 01/31/14. She said her rx expires on Sunday and would like not to have to make another trip to [facility].

## 2014-01-31 ENCOUNTER — Other Ambulatory Visit: Payer: Self-pay | Admitting: Obstetrics and Gynecology

## 2014-01-31 MED ORDER — HYDROCODONE-ACETAMINOPHEN 10-325 MG PO TABS: ORAL_TABLET | ORAL | Status: DC

## 2014-01-31 NOTE — Telephone Encounter (Signed)
rx up front for p/u

## 2014-01-31 NOTE — Telephone Encounter (Signed)
Ok for RF x 1 

## 2014-01-31 NOTE — Telephone Encounter (Signed)
Pt contacted.

## 2014-02-01 LAB — CYTOLOGY - PAP

## 2014-02-18 ENCOUNTER — Other Ambulatory Visit: Payer: Medicare Other

## 2014-02-18 ENCOUNTER — Other Ambulatory Visit: Payer: Self-pay | Admitting: Internal Medicine

## 2014-02-19 MED ORDER — SUMATRIPTAN SUCCINATE 100 MG PO TABS: ORAL_TABLET | ORAL | Status: DC

## 2014-02-19 NOTE — Addendum Note (Signed)
Addended by: Alfred LevinsWYRICK, CINDY D on: 02/19/2014 08:59 AM   Modules accepted: Orders

## 2014-02-21 ENCOUNTER — Ambulatory Visit: Payer: Medicare Other | Admitting: Internal Medicine

## 2014-02-22 ENCOUNTER — Ambulatory Visit: Payer: Medicare Other | Admitting: Internal Medicine

## 2014-02-22 ENCOUNTER — Encounter: Payer: Self-pay | Admitting: Internal Medicine

## 2014-02-28 ENCOUNTER — Other Ambulatory Visit: Payer: Self-pay | Admitting: *Deleted

## 2014-02-28 MED ORDER — HYDROCODONE-ACETAMINOPHEN 10-325 MG PO TABS: ORAL_TABLET | ORAL | Status: DC

## 2014-02-28 MED ORDER — LISDEXAMFETAMINE DIMESYLATE 30 MG PO CAPS: 30.0000 mg | ORAL_CAPSULE | Freq: Every day | ORAL | Status: DC

## 2014-03-01 ENCOUNTER — Other Ambulatory Visit: Payer: Self-pay | Admitting: *Deleted

## 2014-03-11 ENCOUNTER — Encounter: Payer: Self-pay | Admitting: Family Medicine

## 2014-03-11 ENCOUNTER — Ambulatory Visit (INDEPENDENT_AMBULATORY_CARE_PROVIDER_SITE_OTHER): Payer: Medicare Other | Admitting: Family Medicine

## 2014-03-11 VITALS — BP 143/90 | HR 93 | Temp 98.2°F | Resp 16 | Ht 61.0 in | Wt 135.0 lb

## 2014-03-11 DIAGNOSIS — G894 Chronic pain syndrome: Secondary | ICD-10-CM

## 2014-03-11 DIAGNOSIS — R5382 Chronic fatigue, unspecified: Secondary | ICD-10-CM

## 2014-03-11 DIAGNOSIS — E039 Hypothyroidism, unspecified: Secondary | ICD-10-CM

## 2014-03-11 DIAGNOSIS — M810 Age-related osteoporosis without current pathological fracture: Secondary | ICD-10-CM

## 2014-03-11 MED ORDER — LISDEXAMFETAMINE DIMESYLATE 40 MG PO CAPS
40.0000 mg | ORAL_CAPSULE | ORAL | Status: DC
Start: 2014-03-11 — End: 2014-03-11

## 2014-03-11 MED ORDER — LISDEXAMFETAMINE DIMESYLATE 40 MG PO CAPS: 40.0000 mg | ORAL_CAPSULE | ORAL | Status: DC

## 2014-03-11 MED ORDER — HYDROCODONE-ACETAMINOPHEN 10-325 MG PO TABS: ORAL_TABLET | ORAL | Status: DC

## 2014-03-11 NOTE — Progress Notes (Signed)
Office Note 03/13/2014  CC:  Chief Complaint  Patient presents with  . Establish Care   HPI:  [name] is a 73 y.o. White female who is here to establish care. Patient's most recent primary MD: Dr. Lovell SheehanJenkins (saw Dr. Artist PaisYoo x 1 on 01/10/14 before coming here to establish with me). Old records in EPIC/HL were reviewed prior to or during today's visit.  Last visit Dr. Artist PaisYoo addressed her excessive daytime fatigue and tried gabapentin hs instead of vyvanse qd.  Gabapentin caused a "really bad" eye side effect and so she was put back on vyvanse (for chronic fatigue) but for some reason the new vyvanse rx was for 30mg  qd instead of her usual 40mg  qd.  She requests that we restart the 40mg  vyvanse.     She says she was put on prednisone "years ago" by PMD for her chronic musculoskeletal pain.  She was initially on 20mg  qd and she has slowly weened herself down to 5mg  qd.  No objective evidence for an inflammitory condition has ever been found.  TSH has been low for last several checks, no dose changes made, unclear why.  She has no hx of thyroid cancer or nodules.  Her dose was decreased by Dr. Artist PaisYoo about 6-8 wks ago and she is due for TSH recheck now.  Currently taking 100 mcg qd.  Has some moderate right sided pain, up into right side of chest wall --of late, no clear trigger, no alleviating or exacerbating factors.  Also some hip pain that is getting attended to by her orthopod as [facility] [facility], has f/u planned.  Past Medical History  Diagnosis Date  . GERD (gastroesophageal reflux disease)   . Hypothyroidism   . Fibromyalgia   . Migraine syndrome   . Urinary incontinence   . Degenerative disk disease     (02/2011 CT scans).  Cervical (MRI 2001 & 2003).  Hx of thoracic epidural injections via Dr. Noel Geroldohen.  . Scoliosis deformity of spine   . Osteoarthritis   . Chronic fatigue   . Osteoporosis     Bisphosphonates no help.  Prolia caused side effects.  . Insomnia     Ambien occasionally   . Allergic rhinitis due to pollen 11/30/2006    Skin test 10/15/09. Retest 10/30/10 Allergy vaccine d/c'd 09/2012 Significant component of irritant/ vasomotor rhinitis    . Scoliosis   . 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 Vaginal hysterectomy  1983    Ovaries still in.  Nonmalignant reason.  . Colonoscopy  01/27/2004    Awaiting paper reports to be scanned into media section of EMR as of 03/13/14.  . Total knee arthroplasty  2008/2011    both knees  . Rectocele repair    . Rotator cuff repair  2011    Right  . Bladder suspension  1983  . Cholecystectomy  2009  . Esophagogastroduodenoscopy endoscopy  1983; 2005    Family History  Problem Relation Age of Onset  . Colon cancer Neg Hx   . Irritable bowel syndrome    . Dementia Mother   . Lung cancer Father   . Breast cancer Neg Hx     History   Social History  . Marital Status: Married    Spouse Name: N/A    Number of Children: N/A  . Years of Education: N/A   Occupational History  .  retired    Social History Main Topics  . Smoking status: Never Smoker   . Smokeless tobacco: Never Used  . Alcohol Use: No  . Drug Use: No  . Sexual Activity: Yes   Other Topics Concern  . Not on file   Social History Narrative   Married 53  Years, has 2 sons.   Orig from [place].   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   No T/A/Ds.   Tries to eat carb-conscious diet.          Outpatient Encounter Prescriptions as of 03/11/2014  Medication Sig  . bisacodyl (DULCOLAX) 5 MG EC tablet Take 5 mg by mouth at bedtime as needed.    . cholecalciferol (VITAMIN D) 1000 UNITS tablet Take 1,000 Units by mouth daily.  . cyanocobalamin (,VITAMIN B-12,) 1000 MCG/ML injection Inject 1 mL (1,000 mcg total) into the muscle every 6 (six) weeks.  . Efinaconazole 10 % SOLN Apply 1 application topically daily.  . folic  acid (FOLVITE) 1 MG tablet TAKE (1) TABLET DAILY  . HYDROcodone-acetaminophen (NORCO) 10-325 MG per tablet 1 or 1.5 every 6 hours prn pain  . levothyroxine (SYNTHROID) 100 MCG tablet TAKE 1 TABLET BY MOUTH ONCE DAILY  . Multiple Vitamins-Minerals (OCUVITE ADULT FORMULA) CAPS Take 1 capsule by mouth daily.   . multivitamin (THERAGRAN) per tablet Take 1 tablet by mouth daily.   Marland Kitchen. oxybutynin (DITROPAN XL) 15 MG 24 hr tablet   . predniSONE (DELTASONE) 5 MG tablet TAKE 1 TABLET BY MOUTH DAILY  . SUMAtriptan (IMITREX) 100 MG tablet TAKE 1 TABLET BY MOUTH FOR MIGRAINES  . zolpidem (AMBIEN) 10 MG tablet TAKE ONE TABLET AT BED- TIME.  . [DISCONTINUED] HYDROcodone-acetaminophen (NORCO) 10-325 MG per tablet 1 or 1.5 every 6 hours prn pain  . [DISCONTINUED] lisdexamfetamine (VYVANSE) 30 MG capsule Take 1 capsule (30 mg total) by mouth daily.  . BENFOTIAMINE PO Take 500 mg by mouth 2 (two) times daily.   Marland Kitchen. lisdexamfetamine (VYVANSE) 40 MG capsule Take 1 capsule (40 mg total) by mouth every morning.  . vitamin E 400 UNIT capsule Take 400 Units by mouth daily.  . [DISCONTINUED] lisdexamfetamine (VYVANSE) 40 MG capsule Take 1 capsule (40 mg total) by mouth every morning.    Allergies  Allergen Reactions  . Terbinafine Hcl Hives    ROS Review of Systems  Constitutional: Positive for fatigue. Negative for fever.  HENT: Negative for congestion and sore throat.   Eyes: Negative for visual disturbance.  Respiratory: Negative for cough.   Cardiovascular: Negative for chest pain.  Gastrointestinal: Negative for nausea and abdominal pain.  Genitourinary: Negative for dysuria.  Musculoskeletal: Positive for arthralgias (hips). Negative for back pain and joint swelling.  Skin: Negative for rash.  Neurological: Negative for weakness and headaches.  Hematological: Negative for adenopathy.    PE; Blood pressure 143/90, pulse 93, temperature 98.2 F (36.8 C), temperature source Temporal, resp. rate 16,  height 5\' 1"  (1.549 m), weight 135 lb (61.236 kg), SpO2 95.00%. Gen: Alert, well appearing.  Patient is oriented to person, place, time, and situation. OVF:IEPPENT:Eyes: no injection, icteris, swelling, or exudate.  EOMI, PERRLA. Mouth: lips without lesion/swelling.  Oral mucosa pink and moist. Oropharynx without erythema, exudate, or swelling.  Neck - No masses or thyromegaly or limitation in range of motion CV: RRR, 1/6 systolic ejection murmur at cardiac base, S1 and S2 normal, no diastolic murmur.  No rub or gallop. Chest is clear, no wheezing or rales.  Normal symmetric air entry throughout both lung fields. No chest wall deformities or tenderness. ABD: soft, NT EXT: doughy soft tissue in LL's but no pitting edema and no rash or asymmetry.  Pertinent labs:  None today  ASSESSMENT AND PLAN:   Hypothyroidism Due for recheck TSH since lowering of her synthroid dose about 6-8 wks ago.  Chronic fatigue OK to remain off gabapentin. Continue vyvanse but new rx given for 40 mg cap, 1 qd, #30. She handed me the printed rx for 30mg  vyvanse that was given to her by Dr. Artist Pais last visit and I shredded this today. I printed rx's for vyvanse 40mg , 1 cap qd, #30 today for this month and November 2015.  Appropriate fill on/after date was noted on each rx.   Chronic pain syndrome Multifactorial: fibromyalgia, spinal scoliosis, and diffuse osteoarthritis (particulary in spine but also has had 2 TKA surgeries). Long term narcotic pain mgmt. I printed rx's for Vicodin 10/325, 1-1 and 1/2 tabs q6h prn, #75 today to be filled on or after 03/30/14.  Appropriate fill on/after date was noted on the rx.    Osteoporosis Continue vitamin D and calcium supplement. Oral bisphosphonates did not bring significant improvement in bone density. Prolia intolerance. May try IV reclast in near future--will discuss further with pt at future visits. Chronic prednisone use has contributed to this problem and I told her I  would like her to eventually get off this med altogether. Continue 5mg  prednisone qd for now: likely start slow ween at next f/u visit.   Spent 40 min with pt today, with >50% of this time spent in counseling and care coordination regarding the above problems.  Return for 30 min f/u visit at pt's convenience over the next 3 wks.

## 2014-03-11 NOTE — Progress Notes (Signed)
Pre visit review using our clinic review tool, if applicable. No additional management support is needed unless otherwise documented below in the visit note. 

## 2014-03-12 LAB — TSH: TSH: 0.51 u[IU]/mL (ref 0.35–4.50)

## 2014-03-13 ENCOUNTER — Encounter: Payer: Self-pay | Admitting: Family Medicine

## 2014-03-13 DIAGNOSIS — R5382 Chronic fatigue, unspecified: Secondary | ICD-10-CM | POA: Insufficient documentation

## 2014-03-13 NOTE — Assessment & Plan Note (Signed)
Continue vitamin D and calcium supplement. Oral bisphosphonates did not bring significant improvement in bone density. Prolia intolerance. May try IV reclast in near future--will discuss further with pt at future visits. Chronic prednisone use has contributed to this problem and I told her I would like her to eventually get off this med altogether. Continue 5mg  prednisone qd for now: likely start slow ween at next f/u visit.

## 2014-03-13 NOTE — Assessment & Plan Note (Addendum)
Multifactorial: fibromyalgia, spinal scoliosis, and diffuse osteoarthritis (particulary in spine but also has had 2 TKA surgeries). Long term narcotic pain mgmt. I printed rx's for Vicodin 10/325, 1-1 and 1/2 tabs q6h prn, #75 today to be filled on or after 03/30/14.  Appropriate fill on/after date was noted on the rx.

## 2014-03-13 NOTE — Assessment & Plan Note (Signed)
Due for recheck TSH since lowering of her synthroid dose about 6-8 wks ago.

## 2014-03-13 NOTE — Assessment & Plan Note (Signed)
OK to remain off gabapentin. Continue vyvanse but new rx given for 40 mg cap, 1 qd, #30. She handed me the printed rx for 30mg  vyvanse that was given to her by Dr. Artist PaisYoo last visit and I shredded this today. I printed rx's for vyvanse 40mg , 1 cap qd, #30 today for this month and November 2015.  Appropriate fill on/after date was noted on each rx.

## 2014-03-21 ENCOUNTER — Other Ambulatory Visit: Payer: Medicare Other

## 2014-03-28 ENCOUNTER — Ambulatory Visit (INDEPENDENT_AMBULATORY_CARE_PROVIDER_SITE_OTHER): Payer: Medicare Other | Admitting: Family Medicine

## 2014-03-28 ENCOUNTER — Encounter: Payer: Self-pay | Admitting: Internal Medicine

## 2014-03-28 ENCOUNTER — Ambulatory Visit: Payer: Medicare Other | Admitting: Internal Medicine

## 2014-03-28 ENCOUNTER — Encounter: Payer: Self-pay | Admitting: Family Medicine

## 2014-03-28 VITALS — BP 122/80 | HR 104 | Temp 98.2°F | Resp 16 | Ht 61.0 in | Wt 136.0 lb

## 2014-03-28 DIAGNOSIS — G894 Chronic pain syndrome: Secondary | ICD-10-CM

## 2014-03-28 DIAGNOSIS — R5382 Chronic fatigue, unspecified: Secondary | ICD-10-CM

## 2014-03-28 DIAGNOSIS — E039 Hypothyroidism, unspecified: Secondary | ICD-10-CM

## 2014-03-28 DIAGNOSIS — M810 Age-related osteoporosis without current pathological fracture: Secondary | ICD-10-CM

## 2014-03-28 MED ORDER — LISDEXAMFETAMINE DIMESYLATE 40 MG PO CAPS: 40.0000 mg | ORAL_CAPSULE | ORAL | Status: DC

## 2014-03-28 MED ORDER — HYDROCODONE-ACETAMINOPHEN 10-325 MG PO TABS: ORAL_TABLET | ORAL | Status: DC

## 2014-03-28 NOTE — Progress Notes (Signed)
Pre visit review using our clinic review tool, if applicable. No additional management support is needed unless otherwise documented below in the visit note. 

## 2014-03-28 NOTE — Patient Instructions (Addendum)
Take 1/2 prednisone 5mg  tab once daily for 30d, then take 1/4 of 5mg  prednisone tab once daily for 30d, then stop prednisone.  Consider Reclast 5mg  IV once a year for treatment of your osteoporosis.

## 2014-03-28 NOTE — Progress Notes (Signed)
OFFICE VISIT  03/28/2014   CC:  Chief Complaint  Patient presents with  . Follow-up   HPI:    Patient is a 73 y.o. Caucasian female who presents for 2 wk f/u fibromyalgia/chronic pain syndrome, excessive somnolence disorder/chronic fatigue, and osteoporosis.  Also, recent TSH recheck after a dose change was wnl.  Pain well controlled on current meds.  Right hip feeling better since doing stretches for SI joint dysfunction. Feeling much improved regarding energy and motivation and having less sleepiness in daytime since getting back on vyvanse at the 40mg  qd dosing.  She is agreeable to weening off of low dose prednisone that she has been on long term b/c she never was on this for an actual inflammatory joint disorder that either of of could figure out from review of past records, plus this likely added to her osteoporosis problem.  We'll see how her joint pain does as we ween her off this.  I recommended she think about a trial of reclast for her osteoporosis since she has tried oral bisphosphonates and had adverse rxn to Prolia.   Past Medical History  Diagnosis Date  . GERD (gastroesophageal reflux disease)   . Hypothyroidism   . Fibromyalgia   . Migraine syndrome   . Urinary incontinence   . Degenerative disk disease     (02/2011 CT scans).  Cervical (MRI 2001 & 2003).  Hx of thoracic epidural injections via Dr. Noel Geroldohen.  . Scoliosis deformity of spine   . Osteoarthritis   . Chronic fatigue   . Osteoporosis     Bisphosphonates no help.  Prolia caused side effects.  . Insomnia     Ambien occasionally  . Allergic rhinitis due to pollen 11/30/2006    Skin test 10/15/09. Retest 10/30/10 Allergy vaccine d/c'd 09/2012 Significant component of irritant/ vasomotor rhinitis    . COPD (chronic obstructive pulmonary disease)     with fibrosis (noted on CXR 01/2012)  . Sacroiliac joint dysfunction     Past Surgical History  Procedure Laterality Date  . Vaginal hysterectomy  1983   Ovaries still in.  Nonmalignant reason.  . Colonoscopy  01/27/2004    diverticulosis  . Total knee arthroplasty  2008/2011    both knees  . Rectocele repair    . Rotator cuff repair  2011    Right  . Bladder suspension  1983  . Cholecystectomy  2009  . Esophagogastroduodenoscopy endoscopy  1983; 2005    GERD, distal esoph stricture, no Barrett's esoph  . Bone density  01/2009; 12/2010    T -2.8 to -3.0    Outpatient Prescriptions Prior to Visit  Medication Sig Dispense Refill  . BENFOTIAMINE PO Take 500 mg by mouth 2 (two) times daily.     . bisacodyl (DULCOLAX) 5 MG EC tablet Take 5 mg by mouth at bedtime as needed.      . cholecalciferol (VITAMIN D) 1000 UNITS tablet Take 1,000 Units by mouth daily.    . cyanocobalamin (,VITAMIN B-12,) 1000 MCG/ML injection Inject 1 mL (1,000 mcg total) into the muscle every 6 (six) weeks. 10 mL 5  . Efinaconazole 10 % SOLN Apply 1 application topically daily. 4 mL 11  . folic acid (FOLVITE) 1 MG tablet TAKE (1) TABLET DAILY 30 tablet 11  . levothyroxine (SYNTHROID) 100 MCG tablet TAKE 1 TABLET BY MOUTH ONCE DAILY 90 tablet 1  . Multiple Vitamins-Minerals (OCUVITE ADULT FORMULA) CAPS Take 1 capsule by mouth daily.     . multivitamin (THERAGRAN) per  tablet Take 1 tablet by mouth daily.     Marland Kitchen. oxybutynin (DITROPAN XL) 15 MG 24 hr tablet     . predniSONE (DELTASONE) 5 MG tablet TAKE 1 TABLET BY MOUTH DAILY 30 tablet 5  . SUMAtriptan (IMITREX) 100 MG tablet TAKE 1 TABLET BY MOUTH FOR MIGRAINES 9 tablet 3  . vitamin E 400 UNIT capsule Take 400 Units by mouth daily.    Marland Kitchen. zolpidem (AMBIEN) 10 MG tablet TAKE ONE TABLET AT BED- TIME. 30 tablet 3  . HYDROcodone-acetaminophen (NORCO) 10-325 MG per tablet 1 or 1.5 every 6 hours prn pain 75 tablet 0  . lisdexamfetamine (VYVANSE) 40 MG capsule Take 1 capsule (40 mg total) by mouth every morning. 30 capsule 0   No facility-administered medications prior to visit.    Allergies  Allergen Reactions  .  Terbinafine Hcl Hives    ROS As per HPI  PE: Blood pressure 122/80, pulse 104, temperature 98.2 F (36.8 C), temperature source Temporal, resp. rate 16, height 5\' 1"  (1.549 m), weight 136 lb (61.689 kg), SpO2 97 %. Gen: Alert, well appearing.  Patient is oriented to person, place, time, and situation. AFFECT: pleasant, lucid thought and speech. No further exam today.  LABS:  Lab Results  Component Value Date   WBC 7.1 03/05/2013   HGB 13.6 03/05/2013   HCT 40.1 03/05/2013   MCV 92.3 03/05/2013   PLT 182.0 03/05/2013   Lab Results  Component Value Date   TSH 0.51 03/11/2014    IMPRESSION AND PLAN:  1) Chronic pain syndrome: multifactorial: fibromyalgai, spinal scoliosis, and diffuse osteoarthritis (spine esp). Long term narcotic mgmt, feeling fine on current med. New rx for vicodin 10/325 with fill on/after date of 04/28/14 given today to pt. UDS in the past has been appropriate. Controlled substance contract in chart. Ween off of prednisone.  Pt instructions: Take 1/2 prednisone 5mg  tab once daily for 30d, then take 1/4 of 5mg  prednisone tab once daily for 30d, then stop prednisone. She has access/gets followed periodically by her ortho MD's: Dr. Charlann Boxerlin and Dr. Ethelene Halamos, as well.  2) Hypothyroidism: TSH wnl.  Recheck in 6 mo.  3) Chronic fatigue syndrome/excessive somnolence disorder: doing well on vyvanse 40mg  qd. Gave rx today with fill on/after date of 05/09/14 to pt today.  4) Osteoporosis: no response to oral bisphosphonate treatment, adverse rxn to Prolia. "Consider Reclast 5mg  IV once a year for treatment of your osteoporosis."  Pt agreed to read about this and think about it and we'll discuss again in 2 mo at f/u.  FOLLOW UP: Return in about 2 months (around 05/28/2014) for f/u prednisone ween/fatigue/chronic pain.

## 2014-04-04 ENCOUNTER — Encounter: Payer: Self-pay | Admitting: Family Medicine

## 2014-04-05 ENCOUNTER — Other Ambulatory Visit: Payer: Self-pay | Admitting: Internal Medicine

## 2014-04-09 ENCOUNTER — Telehealth: Payer: Self-pay | Admitting: Family Medicine

## 2014-04-09 NOTE — Telephone Encounter (Signed)
RF request for ambien.  Last OV was 03/28/14.  Last RX printed 08/01/13 x 3 rfs.  Please advise.

## 2014-04-10 MED ORDER — ZOLPIDEM TARTRATE 10 MG PO TABS: ORAL_TABLET | ORAL | Status: DC

## 2014-04-10 NOTE — Telephone Encounter (Signed)
Ambien rx printed

## 2014-04-10 NOTE — Telephone Encounter (Signed)
Rx faxed

## 2014-04-20 ENCOUNTER — Other Ambulatory Visit: Payer: Self-pay | Admitting: Family Medicine

## 2014-05-06 ENCOUNTER — Telehealth: Payer: Self-pay | Admitting: Family Medicine

## 2014-05-06 NOTE — Telephone Encounter (Signed)
Hydrocodone, please contact pt when ready to pu

## 2014-05-06 NOTE — Telephone Encounter (Signed)
LMOM for pt to CB.  Pt should have an RX for hydrocodone to fill on or after 04/28/14.  She will need to locate that RX.  She is not due for refill from us until 05/28/14.

## 2014-05-14 ENCOUNTER — Telehealth: Payer: Self-pay | Admitting: Family Medicine

## 2014-05-14 MED ORDER — HYDROCODONE-ACETAMINOPHEN 10-325 MG PO TABS: ORAL_TABLET | ORAL | Status: DC

## 2014-05-14 NOTE — Telephone Encounter (Signed)
I called pt's pharmacist to make sure she had not inadvertently left this rx on file with the pharmacy and then forgot.  It was not on file.  Last fill for her pain med was in November. I believe this patient is LOW RISK for abuse/misuse of this medication so I will print replacement rx THIS TIME for her pain med.  However, make sure patient is aware that I highly recommend that she utilize her pharmacy for holding her controlled substance prescriptions so that she doesn't lose them in the future.  There will be no further replacement rx's like this in the future..-thx

## 2014-05-14 NOTE — Telephone Encounter (Signed)
Pt husband called back and states that the RX was not found, not filled and we have no record of it being in the front office. Pt is asking if she can have a RX for just a few pills until her May 28, 2014 regular refill is available.  Advise?

## 2014-05-14 NOTE — Telephone Encounter (Signed)
Pt's husband called back and spoke with Jewel Baizearcy, stating they could not find the Rx anywhere.  Pt does have a controlled substance contract on file.  I checked controlled substance website and it looks like she filled both vyvanse rx's but only one hydrocodone.  Please advise.

## 2014-05-14 NOTE — Telephone Encounter (Signed)
I have already sent a message about this to Dr. Milinda CaveMcGowen.  Patient will have to wait until he advises.  They did fill out a controlled substance contract and the first part states:  "I am responsible for my controlled substance medications.  If my prescription is lost, misplaced or stolen, or if I take more than prescribed, my doctor will not write me a new prescription."  Please let them know if they call back before Dr. Milinda CaveMcGowen makes his decision.  Thanks!

## 2014-05-14 NOTE — Telephone Encounter (Signed)
Patient aware.  Rx is at front desk for p/u.  

## 2014-05-27 ENCOUNTER — Encounter: Payer: Self-pay | Admitting: Family Medicine

## 2014-05-27 ENCOUNTER — Ambulatory Visit (INDEPENDENT_AMBULATORY_CARE_PROVIDER_SITE_OTHER): Payer: Medicare Other | Admitting: Family Medicine

## 2014-05-27 VITALS — BP 142/89 | HR 82 | Temp 97.4°F | Resp 18 | Ht 61.0 in | Wt 132.0 lb

## 2014-05-27 DIAGNOSIS — M797 Fibromyalgia: Secondary | ICD-10-CM

## 2014-05-27 DIAGNOSIS — R5382 Chronic fatigue, unspecified: Secondary | ICD-10-CM

## 2014-05-27 DIAGNOSIS — E039 Hypothyroidism, unspecified: Secondary | ICD-10-CM

## 2014-05-27 DIAGNOSIS — G894 Chronic pain syndrome: Secondary | ICD-10-CM

## 2014-05-27 DIAGNOSIS — L853 Xerosis cutis: Secondary | ICD-10-CM

## 2014-05-27 DIAGNOSIS — M810 Age-related osteoporosis without current pathological fracture: Secondary | ICD-10-CM

## 2014-05-27 DIAGNOSIS — M159 Polyosteoarthritis, unspecified: Secondary | ICD-10-CM

## 2014-05-27 DIAGNOSIS — M150 Primary generalized (osteo)arthritis: Secondary | ICD-10-CM

## 2014-05-27 MED ORDER — HYDROCODONE-ACETAMINOPHEN 10-325 MG PO TABS: ORAL_TABLET | ORAL | Status: DC

## 2014-05-27 NOTE — Progress Notes (Signed)
OFFICE NOTE  05/27/2014  CC:  Chief Complaint  Patient presents with  . Follow-up     HPI: Patient is a 74 y.o. Caucasian female who is here for 2 mo f/u fibromyalgia/chronic pain syndrome, excessive somnolence disorder/chronic fatigue, osteoporosis, and hypothyroidism. Our plan was to ween her off of her longterm, low dose prednisone therapy.  However, she is still on  qd dosing b/c she was having pain in hands at christmas.  She is willing to try again, though. She also stopped taking the vyvanse for daytime somnolence over a week ago.  She says she doesn't plan on getting this RF'd.   She also says she doesn't plan on continuing the [place] any longer. No probs with thyroid med, admits she feels better since dose adjustment.  Her pain is well controlled on the Norco 10/325 that I rx for her--depending on how her month goes this sometimes lasts 27-28 days and sometimes she has a slight surplus of pills at the end of a month.  Has itchy back for the last month or so, has been applying "old powder that used to be my mother's" --it is nystatin powder.  Says this helps the itching.  Asks me to look at back and tell her what to do.  Pertinent PMH:  Past medical, surgical, social, and family history reviewed and no changes are noted since last office visit.    MEDS:  Outpatient Prescriptions Prior to Visit  Medication Sig Dispense Refill  . BENFOTIAMINE PO Take 500 mg by mouth 2 (two) times daily.     . bisacodyl (DULCOLAX) 5 MG EC tablet Take 5 mg by mouth at bedtime as needed.      . cholecalciferol (VITAMIN D) 1000 UNITS tablet Take 1,000 Units by mouth daily.    . cyanocobalamin (,VITAMIN B-12,) 1000 MCG/ML injection Inject 1 mL (1,000 mcg total) into the muscle every 6 (six) weeks. 10 mL 5  . Efinaconazole 10 % SOLN Apply 1 application topically daily. 4 mL 11  . folic acid (FOLVITE) 1 MG tablet TAKE 1 TABLET BY MOUTH DAILY 30 tablet 3  . Multiple Vitamins-Minerals (OCUVITE ADULT  FORMULA) CAPS Take 1 capsule by mouth daily.     . multivitamin (THERAGRAN) per tablet Take 1 tablet by mouth daily.     Marland Kitchen oxybutynin (DITROPAN XL) 15 MG 24 hr tablet     . predniSONE (DELTASONE) 5 MG tablet TAKE 1 TABLET BY MOUTH DAILY 30 tablet 5  . SUMAtriptan (IMITREX) 100 MG tablet TAKE 1 TABLET BY MOUTH FOR MIGRAINES 9 tablet 3  . SYNTHROID 100 MCG tablet TAKE 1 TABLET BY MOUTH ONCE DAILY 90 tablet 1  . vitamin E 400 UNIT capsule Take 400 Units by mouth daily.    Marland Kitchen zolpidem (AMBIEN) 10 MG tablet TAKE ONE TABLET AT BED- TIME. 30 tablet 3  . HYDROcodone-acetaminophen (NORCO) 10-325 MG per tablet 1 or 1.5 every 6 hours prn pain 75 tablet 0  . lisdexamfetamine (VYVANSE) 40 MG capsule Take 1 capsule (40 mg total) by mouth every morning. (Patient not taking: Reported on 05/27/2014) 30 capsule 0   No facility-administered medications prior to visit.    PE: Blood pressure 142/89, pulse 82, temperature 97.4 F (36.3 C), temperature source Temporal, resp. rate 18, height  (1.549 m), weight 132 lb (59.875 kg), SpO2 99 %. Gen: Alert, well appearing.  Patient is oriented to person, place, time, and situation. Back: no rash or suspicious lesions.  Skin is a bit dry.  Pt examined with CMA Faythe GheeLisa Albright as chaperone.  IMPRESSION AND PLAN:  1) Chronic pain syndrome/fibromyalgia + osteoarthritis: continue current pain meds. I printed rx's for Norco 10/325, 1-1.5 tabs q6h prn, #75, today for January and February 2016.  Appropriate fill on/after date was noted on each rx. She will try again to ween down to 2.5mg  prednisone qd.  2) Excessive somnolence disorder/chronic fatigue: she seems to NOT want to take anything for this anymore.  She self d/c'd the vyvanse and I think this is fine--this was taken off of pt's med list today.  3) Insomnia: she wants to d/c zolpidem and NOT substitute any sedative hypnotic hs at this time.  4) Hypothyroidism: The current medical regimen is effective;  continue  present plan and medications. Recheck TSH 61mo.  5) Osteoporosis: no response to oral bisphosphonate treatment, ?adverse rxn to Prolia? (pt now admits this could have simply been "flares" of her chronic medical issues).  She still has not given consideration to my suggestion of Reclast 5mg  IV once a year, but she will try again to think about this.  I also recommended she think about letting me refer her to an endocrinologist for further expertise/info/recommendations on this issue for her.  She'll think about all of this.  6) Xerosis of back, leading to itching.  Reassured pt, recommended better moisturization.  An After Visit Summary was printed and given to the patient.  FOLLOW UP: 4 mo

## 2014-05-27 NOTE — Progress Notes (Signed)
Pre visit review using our clinic review tool, if applicable. No additional management support is needed unless otherwise documented below in the visit note. 

## 2014-06-24 ENCOUNTER — Ambulatory Visit: Payer: Medicare Other | Admitting: Family Medicine

## 2014-07-18 ENCOUNTER — Other Ambulatory Visit: Payer: Self-pay | Admitting: Internal Medicine

## 2014-07-22 NOTE — Telephone Encounter (Signed)
Imitrex RF'd.

## 2014-07-23 ENCOUNTER — Other Ambulatory Visit: Payer: Self-pay | Admitting: Internal Medicine

## 2014-07-24 NOTE — Telephone Encounter (Signed)
Prednisone 5mg  qd RF'd.

## 2014-08-05 ENCOUNTER — Telehealth: Payer: Self-pay | Admitting: Family Medicine

## 2014-08-05 MED ORDER — HYDROCODONE-ACETAMINOPHEN 10-325 MG PO TABS
ORAL_TABLET | ORAL | Status: DC
Start: 2014-08-05 — End: 2014-09-02

## 2014-08-05 NOTE — Telephone Encounter (Signed)
Pt is not quite due yet but Dr. Milinda CaveMcGowen said he would allow it this time.  Rx printed.   Pt aware.  Dr  Milinda CaveMcgowen to sign and it will be placed at front desk for p/u.

## 2014-08-05 NOTE — Telephone Encounter (Signed)
Pt called and needs a refill on her hydrocodone.  Her husband will be coming through here this afternoon and she would like him to pick it up.

## 2014-08-12 ENCOUNTER — Encounter: Payer: Self-pay | Admitting: Internal Medicine

## 2014-09-02 ENCOUNTER — Encounter: Payer: Self-pay | Admitting: Family Medicine

## 2014-09-02 ENCOUNTER — Ambulatory Visit (INDEPENDENT_AMBULATORY_CARE_PROVIDER_SITE_OTHER): Payer: Medicare Other | Admitting: Family Medicine

## 2014-09-02 VITALS — BP 139/85 | HR 99 | Temp 98.7°F | Resp 18 | Ht 61.0 in | Wt 132.0 lb

## 2014-09-02 DIAGNOSIS — Z23 Encounter for immunization: Secondary | ICD-10-CM | POA: Diagnosis not present

## 2014-09-02 DIAGNOSIS — E039 Hypothyroidism, unspecified: Secondary | ICD-10-CM | POA: Diagnosis not present

## 2014-09-02 DIAGNOSIS — E538 Deficiency of other specified B group vitamins: Secondary | ICD-10-CM

## 2014-09-02 MED ORDER — HYDROCODONE-ACETAMINOPHEN 10-325 MG PO TABS: ORAL_TABLET | ORAL | Status: DC

## 2014-09-02 MED ORDER — FOLIC ACID 1 MG PO TABS: 1.0000 mg | ORAL_TABLET | Freq: Every day | ORAL | Status: DC

## 2014-09-02 MED ORDER — LISDEXAMFETAMINE DIMESYLATE 40 MG PO CAPS: 40.0000 mg | ORAL_CAPSULE | ORAL | Status: DC

## 2014-09-02 MED ORDER — SUMATRIPTAN SUCCINATE 100 MG PO TABS: ORAL_TABLET | ORAL | Status: DC

## 2014-09-02 NOTE — Progress Notes (Signed)
OFFICE VISIT  09/02/2014   CC:  Chief Complaint  Patient presents with  . Follow-up  . Medication Refill    vitamin b12 injection?     HPI:    Patient is a 74 y.o. Caucasian female who presents for 3 mo f/u fibromyalgia/chronic pain syndrome (on chronic narcotic pain med and chronic low dose steroids for functioning) , hypothyroidism, osteoporosis.  She says she tried weening off prednisone 2 times since last visit and when she did this she felt a resurgence of bilat hip pain and low back pain.  Still taking norco regularly.  Says she still takes vyvanse occasionally, usually 1/2 capful, for excessive daytime fatigue/somnolence.   Has been having to help care for her son's foster children lately, feels stressed from this.  When she takes 1/2 of one of her caps of this med it causes some mild dizziness and she thinks it may be causing some memory impairment.   She recalls feeling "horrible" when she was tried on ritalin by pref PMD.  I also see provigil in her old med list.  She perseverates about her worry about her short term memory problems.  However, she describes only having trouble with fairly complex recall like "the exact meds I am on and who rx'd them".  Gives herself vit B12 1000 mcg IM q 6 wks--last injection was 6 wks ago.  She asks that her vit B12 level be checked.  Says she is afraid of the injections and any other potential meds used to treat osteoporosis.   We have gone over and over this situation and she once again declines endocrinologist referral.  She is due for mammogram, plans on getting this via her GYN.   She got recall letter from her GI MD to get colonoscopy: it has been 10 yrs now since her last one.      Past Medical History  Diagnosis Date  . GERD (gastroesophageal reflux disease)   . Hypothyroidism   . Fibromyalgia   . Migraine syndrome   . Urinary incontinence   . Degenerative disk disease     (02/2011 CT scans).  Cervical (MRI 2001 & 2003).  Hx  of thoracic epidural injections via Dr. Noel Geroldohen.  . Scoliosis deformity of spine   . Osteoarthritis   . Chronic fatigue   . Osteoporosis     Bisphosphonates no help.  Prolia caused side effects.  . Insomnia     Ambien occasionally  . Allergic rhinitis due to pollen 11/30/2006    Skin test 10/15/09. Retest 10/30/10 Allergy vaccine d/c'd 09/2012 Significant component of irritant/ vasomotor rhinitis    . COPD (chronic obstructive pulmonary disease)     with fibrosis (noted on CXR 01/2012)  . Sacroiliac joint dysfunction     Past Surgical History  Procedure Laterality Date  . Vaginal hysterectomy  1983    Ovaries still in.  Nonmalignant reason.  . Colonoscopy  01/27/2004    diverticulosis  . Total knee arthroplasty  2008/2011    both knees  . Rectocele repair    . Rotator cuff repair  2011    Right  . Bladder suspension  1983  . Cholecystectomy  2009  . Esophagogastroduodenoscopy endoscopy  1983; 2005    GERD, distal esoph stricture, no Barrett's esoph  . Bone density  01/2009; 12/2010    T -2.8 to -3.0    Outpatient Prescriptions Prior to Visit  Medication Sig Dispense Refill  . bisacodyl (DULCOLAX) 5 MG EC tablet Take 5 mg  by mouth at bedtime as needed.      . cholecalciferol (VITAMIN D) 1000 UNITS tablet Take 1,000 Units by mouth daily.    . cyanocobalamin (,VITAMIN B-12,) 1000 MCG/ML injection Inject 1 mL (1,000 mcg total) into the muscle every 6 (six) weeks. 10 mL 5  . Efinaconazole 10 % SOLN Apply 1 application topically daily. 4 mL 11  . Multiple Vitamins-Minerals (OCUVITE ADULT FORMULA) CAPS Take 1 capsule by mouth daily.     . multivitamin (THERAGRAN) per tablet Take 1 tablet by mouth daily.     . predniSONE (DELTASONE) 5 MG tablet TAKE 1 TABLET BY MOUTH DAILY 30 tablet 6  . SYNTHROID 100 MCG tablet TAKE 1 TABLET BY MOUTH ONCE DAILY 90 tablet 1  . vitamin E 400 UNIT capsule Take 400 Units by mouth daily.    . folic acid (FOLVITE) 1 MG tablet TAKE 1 TABLET BY MOUTH DAILY 30  tablet 3  . HYDROcodone-acetaminophen (NORCO) 10-325 MG per tablet 1 or 1.5 every 6 hours prn pain 75 tablet 0  . SUMAtriptan (IMITREX) 100 MG tablet TAKE 1 TABLET BY MOUTH FOR MIGRAINES 9 tablet 6  . oxybutynin (DITROPAN XL) 15 MG 24 hr tablet     . BENFOTIAMINE PO Take 500 mg by mouth 2 (two) times daily.      No facility-administered medications prior to visit.    Allergies  Allergen Reactions  . Terbinafine Hcl Hives    ROS As per HPI  PE: Blood pressure 139/85, pulse 99, temperature 98.7 F (37.1 C), temperature source Temporal, resp. rate 18, height  (1.549 m), weight 132 lb (59.875 kg), SpO2 99 %. Gen: Alert, well appearing.  Patient is oriented to person, place, time, and situation. AFFECT: pleasant, lucid thought and speech. No further exam today.  LABS:  Lab Results  Component Value Date   TSH 0.51 03/11/2014    IMPRESSION AND PLAN:  1) Chronic pain/fibromyalgia, with polymyalgia but no hx of any documented inflammatory arthritis. She remains most happy/stable while taking prednisone  qd, continue Norco 10/325, 1- 1 and 1/2 tabs q6h prn, #75 (gave rx's for norco for this month, May 2016, and June 2016.  2) Chronic daytime somnolence/fatigue: vyvanse  1/2-1 tab qd prn helps but she has c/o side effects: I told her that I have nothing else to offer her for this, so she decided to continue taking this med qd prn, rx for  qd, #30.  3) Hypothyroidism; The current medical regimen is effective;  continue present plan and medications. Recheck TSH today.  4) Vit B12 deficiency: check level today (she is 6 weeks from her most recent injection.  5) Preventative health care: prevnar 13 IM today. Mammogram: she will call her GYN office and they'll arrange this. Colon cancer screening: recall letter received, she'll call her GI MD and arrange repeat colonoscopy (last was 10 yrs ago).  6) Osteoporosis: intolerant of all treatments attempted, including prolia.   She is afraid to try Reclast and also declines referral to endo for expert advise. She understands that her chronic prednisone use puts her at even higher risk for decreased bone density.  An After Visit Summary was printed and given to the patient.  FOLLOW UP: Return in about 4 months (around 01/02/2015) for routine chronic illness f/u.

## 2014-09-02 NOTE — Progress Notes (Signed)
Pre visit review using our clinic review tool, if applicable. No additional management support is needed unless otherwise documented below in the visit note. 

## 2014-09-03 LAB — TSH: TSH: 0.59 u[IU]/mL (ref 0.35–4.50)

## 2014-09-03 LAB — VITAMIN B12: VITAMIN B 12: 754 pg/mL (ref 211–911)

## 2014-09-30 ENCOUNTER — Telehealth: Payer: Self-pay

## 2014-09-30 DIAGNOSIS — Z1239 Encounter for other screening for malignant neoplasm of breast: Secondary | ICD-10-CM

## 2014-09-30 NOTE — Telephone Encounter (Addendum)
Patient wishes for a MMG to be scheduled

## 2014-10-02 ENCOUNTER — Other Ambulatory Visit: Payer: Self-pay | Admitting: Family Medicine

## 2014-10-02 DIAGNOSIS — Z1239 Encounter for other screening for malignant neoplasm of breast: Secondary | ICD-10-CM

## 2014-10-02 NOTE — Telephone Encounter (Signed)
I ordered the mammogram 09/30/14.

## 2014-10-03 ENCOUNTER — Telehealth: Payer: Self-pay | Admitting: Family Medicine

## 2014-10-03 MED ORDER — LISDEXAMFETAMINE DIMESYLATE 40 MG PO CAPS: 40.0000 mg | ORAL_CAPSULE | ORAL | Status: DC

## 2014-10-03 NOTE — Telephone Encounter (Signed)
Pt requesting rf of vyvanse.  Last OV was 09/02/14.  Last Rx was 09/02/14.  Please advise.

## 2014-10-03 NOTE — Telephone Encounter (Signed)
I printed vyvanse rx for this month AND for June.-thx

## 2014-10-28 ENCOUNTER — Encounter: Payer: Self-pay | Admitting: Family Medicine

## 2014-11-06 ENCOUNTER — Other Ambulatory Visit: Payer: Self-pay | Admitting: Family Medicine

## 2014-11-06 NOTE — Telephone Encounter (Signed)
Dr. Milinda Cave pt. RF request for Zolpidem. LOV: 09/02/14 Next ov: 01/02/15 Last written: Unknown Please advise. Thanks.

## 2014-12-03 ENCOUNTER — Other Ambulatory Visit: Payer: Self-pay | Admitting: Family Medicine

## 2014-12-03 MED ORDER — PREDNISONE 2.5 MG PO TABS
2.5000 mg | ORAL_TABLET | Freq: Every day | ORAL | Status: DC
Start: 2014-12-03 — End: 2015-05-07

## 2014-12-03 MED ORDER — HYDROCODONE-ACETAMINOPHEN 10-325 MG PO TABS: ORAL_TABLET | ORAL | Status: DC

## 2014-12-03 MED ORDER — LISDEXAMFETAMINE DIMESYLATE 40 MG PO CAPS: 40.0000 mg | ORAL_CAPSULE | ORAL | Status: DC

## 2014-12-03 NOTE — Telephone Encounter (Signed)
Pt requesting rf of hydrocodone and vyvanse.   Last OV was 09/02/14.   Last Rx stated fill on or after 11/02/14.  Patient also requesting prednisone rx 2.5 mg.   She states she can't split 5mg  tabs in half.   Please advise.

## 2014-12-04 ENCOUNTER — Telehealth: Payer: Self-pay | Admitting: Family Medicine

## 2014-12-04 MED ORDER — LEVOTHYROXINE SODIUM 100 MCG PO TABS: 100.0000 ug | ORAL_TABLET | Freq: Every day | ORAL | Status: DC

## 2014-12-04 NOTE — Telephone Encounter (Signed)
Pt needs refill on Synthroid rx. She is schedule to see Dr. Milinda CaveMcGowen on January 02, 2015./dh

## 2014-12-04 NOTE — Telephone Encounter (Signed)
RF request for Synthroid LOV: 09/02/14 NOV: 01/02/15 Last written: 04/08/14 #90 w/ 1RF TSH 0.59 on 09/02/14

## 2014-12-11 ENCOUNTER — Telehealth: Payer: Self-pay | Admitting: Family Medicine

## 2014-12-11 DIAGNOSIS — H269 Unspecified cataract: Secondary | ICD-10-CM

## 2014-12-11 DIAGNOSIS — H3530 Unspecified macular degeneration: Secondary | ICD-10-CM

## 2014-12-11 DIAGNOSIS — H409 Unspecified glaucoma: Secondary | ICD-10-CM

## 2014-12-11 NOTE — Telephone Encounter (Signed)
Pt called requesting referral to [facility] for third opinion.  Patient states she was told that she has macular degeneration, glaucoma, and cataracts.    Please advise.

## 2014-12-13 NOTE — Telephone Encounter (Signed)
Ophtho referral ordered as per pt's request.

## 2015-01-02 ENCOUNTER — Ambulatory Visit (INDEPENDENT_AMBULATORY_CARE_PROVIDER_SITE_OTHER): Payer: Medicare Other | Admitting: Family Medicine

## 2015-01-02 ENCOUNTER — Encounter: Payer: Self-pay | Admitting: Family Medicine

## 2015-01-02 VITALS — BP 160/100 | HR 95 | Temp 98.0°F | Resp 16 | Ht 61.0 in | Wt 141.0 lb

## 2015-01-02 DIAGNOSIS — E039 Hypothyroidism, unspecified: Secondary | ICD-10-CM | POA: Diagnosis not present

## 2015-01-02 DIAGNOSIS — R030 Elevated blood-pressure reading, without diagnosis of hypertension: Secondary | ICD-10-CM

## 2015-01-02 DIAGNOSIS — E538 Deficiency of other specified B group vitamins: Secondary | ICD-10-CM

## 2015-01-02 DIAGNOSIS — R5382 Chronic fatigue, unspecified: Secondary | ICD-10-CM

## 2015-01-02 DIAGNOSIS — M810 Age-related osteoporosis without current pathological fracture: Secondary | ICD-10-CM | POA: Diagnosis not present

## 2015-01-02 DIAGNOSIS — G894 Chronic pain syndrome: Secondary | ICD-10-CM

## 2015-01-02 DIAGNOSIS — M797 Fibromyalgia: Secondary | ICD-10-CM

## 2015-01-02 LAB — VITAMIN B12: VITAMIN B 12: 862 pg/mL (ref 211–911)

## 2015-01-02 MED ORDER — LISDEXAMFETAMINE DIMESYLATE 40 MG PO CAPS: ORAL_CAPSULE | ORAL | Status: DC

## 2015-01-02 MED ORDER — HYDROCODONE-ACETAMINOPHEN 10-325 MG PO TABS: ORAL_TABLET | ORAL | Status: DC

## 2015-01-02 MED ORDER — SUMATRIPTAN SUCCINATE 100 MG PO TABS: ORAL_TABLET | ORAL | Status: DC

## 2015-01-02 MED ORDER — FOLIC ACID 1 MG PO TABS: 1.0000 mg | ORAL_TABLET | Freq: Every day | ORAL | Status: DC

## 2015-01-02 MED ORDER — LISDEXAMFETAMINE DIMESYLATE 40 MG PO CAPS: 40.0000 mg | ORAL_CAPSULE | ORAL | Status: DC

## 2015-01-02 NOTE — Progress Notes (Signed)
OFFICE VISIT  01/02/2015   CC:  Chief Complaint  Patient presents with  . Follow-up   HPI:    Patient is a 74 y.o. Caucasian female who presents for 4 mo f/u fibromyalgia/chronic pain syndrome (on chronic pain med and chronic low dose steroids for functioning), hypothyroidism. She has decreased her prednisone 2.5mg  qd and feels like this is sufficient.  Pain stable. Vyvanse daily: doing fine, still with good days and bad days. She gets her calcium and vit D through her diet and a daily MVI. Taking synthroid correctly.  Lots of discussion ensued again today regarding her osteoporosis and her fracture risk and taking or not taking meds for this condition, etc. She STILL would not commit to taking any med for this condition and STILL declines to see an endocrinologist regarding getting more expert opinion on the matter.   Past Medical History  Diagnosis Date  . GERD (gastroesophageal reflux disease)   . Hypothyroidism   . Fibromyalgia   . Migraine syndrome   . Urinary incontinence   . Degenerative disk disease     (02/2011 CT scans).  Cervical (MRI 2001 & 2003).  Hx of thoracic epidural injections via Dr. Noel Gerold.  . Scoliosis deformity of spine   . Osteoarthritis   . Chronic fatigue   . Osteoporosis     Bisphosphonates no help.  Prolia caused side effects.  . Insomnia     Ambien occasionally  . Allergic rhinitis due to pollen 11/30/2006    Skin test 10/15/09. Retest 10/30/10 Allergy vaccine d/c'd 09/2012 Significant component of irritant/ vasomotor rhinitis    . COPD (chronic obstructive pulmonary disease)     with fibrosis (noted on CXR 01/2012)  . Sacroiliac joint dysfunction     Past Surgical History  Procedure Laterality Date  . Vaginal hysterectomy  1983    Ovaries still in.  Nonmalignant reason.  . Colonoscopy  01/27/2004    diverticulosis  . Total knee arthroplasty  2008/2011    both knees  . Rectocele repair    . Rotator cuff repair  2011    Right  . Bladder  suspension  1983  . Cholecystectomy  2009  . Esophagogastroduodenoscopy endoscopy  1983; 2005    GERD, distal esoph stricture, no Barrett's esoph  . Bone density  01/2009; 12/2010    T -2.8 to -3.0    Outpatient Prescriptions Prior to Visit  Medication Sig Dispense Refill  . bisacodyl (DULCOLAX) 5 MG EC tablet Take 5 mg by mouth at bedtime as needed.      . cholecalciferol (VITAMIN D) 1000 UNITS tablet Take 1,000 Units by mouth daily.    Marland Kitchen levothyroxine (SYNTHROID) 100 MCG tablet Take 1 tablet (100 mcg total) by mouth daily. 90 tablet 1  . Multiple Vitamins-Minerals (OCUVITE ADULT FORMULA) CAPS Take 1 capsule by mouth daily.     . multivitamin (THERAGRAN) per tablet Take 1 tablet by mouth daily.     . predniSONE (DELTASONE) 2.5 MG tablet Take 1 tablet (2.5 mg total) by mouth daily with breakfast. 30 tablet 6  . vitamin E 400 UNIT capsule Take 400 Units by mouth daily.    Marland Kitchen zolpidem (AMBIEN) 10 MG tablet TAKE 1 TABLET BY MOUTH ONCE DAILY AT BEDTIME 30 tablet 0  . cyanocobalamin (,VITAMIN B-12,) 1000 MCG/ML injection Inject 1 mL (1,000 mcg total) into the muscle every 6 (six) weeks. 10 mL 5  . folic acid (FOLVITE) 1 MG tablet Take 1 tablet (1 mg total) by mouth daily.  30 tablet 12  . HYDROcodone-acetaminophen (NORCO) 10-325 MG per tablet 1 or 1.5 every 6 hours prn pain 75 tablet 0  . lisdexamfetamine (VYVANSE) 40 MG capsule Take 1 capsule (40 mg total) by mouth every morning. 30 capsule 0  . SUMAtriptan (IMITREX) 100 MG tablet TAKE 1 TABLET BY MOUTH FOR MIGRAINES 9 tablet 6  . Efinaconazole 10 % SOLN Apply 1 application topically daily. (Patient not taking: Reported on 01/02/2015) 4 mL 11  . oxybutynin (DITROPAN XL) 15 MG 24 hr tablet     . zolpidem (AMBIEN) 5 MG tablet Take 5 mg by mouth at bedtime as needed for sleep.     No facility-administered medications prior to visit.    Allergies  Allergen Reactions  . Terbinafine Hcl Hives    ROS As per HPI  PE: Blood pressure 152/84,  pulse 95, temperature 98 F (36.7 C), temperature source Oral, resp. rate 16, height 5\' 1"  (1.549 m), weight 141 lb (63.957 kg), SpO2 99 %.  Repeat bp 160/100 Gen: Alert, well appearing.  Patient is oriented to person, place, time, and situation.   LABS:  Lab Results  Component Value Date   TSH 0.59 09/02/2014   Lab Results  Component Value Date   WBC 7.1 03/05/2013   HGB 13.6 03/05/2013   HCT 40.1 03/05/2013   MCV 92.3 03/05/2013   PLT 182.0 03/05/2013   Lab Results  Component Value Date   CREATININE 0.7 03/05/2013   BUN 21 03/05/2013   NA 138 03/05/2013   K 3.5 03/05/2013   CL 100 03/05/2013   CO2 27 03/05/2013   Lab Results  Component Value Date   ALT 17 03/05/2013   AST 22 03/05/2013   ALKPHOS 53 03/05/2013   BILITOT 0.6 03/05/2013   Lab Results  Component Value Date   CHOL 268* 02/12/2010   Lab Results  Component Value Date   HDL 103.00 02/12/2010   No results found for: [facility] Lab Results  Component Value Date   TRIG 126.0 02/12/2010   Lab Results  Component Value Date   CHOLHDL 3 02/12/2010   IMPRESSION AND PLAN:  1) Chronic pain/fibromyalgia--amplified musculoskeletal pain syndrome: stable. She wishes to stay on prednisone 2.5mg  qd and current vicodin (1-1.5 tabs q6h prn, #75 per month). I printed rx's for vicodin 10/325, 1-1.5 q6h prn, #75 today for this month, Sept, and Oct 2016.  Appropriate fill on/after date was noted on each rx.  2) Chronic fatigue with adult ADD sx's: stable on vyvanse 40mg  qd.  I printed rx's for vyvanse 40mg  qd, #30 today for this month, Sept, and Oct 2016.  Appropriate fill on/after date was noted on each rx.  3) Hypothyroidism: The current medical regimen is effective;  continue present plan and medications. She may call and request generic substitute if it is cost effective.    4) Hx of vit B12 deficiency: she was on injections prior to becoming my patient. She stopped injections 4 mo ago after vit B12 level here was  normal not that long after an injection. We'll recheck this today to see if it is remaining at adequate level w/out IM injections.  5) Elevated bp w/out dx of HTN: check home bp daily for 10d and call these numbers in to my nurse.  6) Osteoporosis: she will contact me with her decision about whether she wants to proceed with treatment for this or not. She is well aware of the risks/benefits involved and knows she has the option of endocrinology  consultation as well.  An After Visit Summary was printed and given to the patient.  FOLLOW UP: Return in about 4 months (around 05/04/2015) for routine chronic illness f/u.

## 2015-01-02 NOTE — Progress Notes (Signed)
Pre visit review using our clinic review tool, if applicable. No additional management support is needed unless otherwise documented below in the visit note. 

## 2015-01-23 ENCOUNTER — Encounter: Payer: Self-pay | Admitting: Family Medicine

## 2015-01-23 ENCOUNTER — Telehealth: Payer: Self-pay | Admitting: Family Medicine

## 2015-01-23 NOTE — Telephone Encounter (Signed)
Pt advised and voiced understanding.   

## 2015-01-23 NOTE — Telephone Encounter (Signed)
Left message for pt to call back  °

## 2015-01-23 NOTE — Telephone Encounter (Signed)
Pls notify pt that I reviewed the BP's and HR's that she dropped off and they all look great. She does not have to monitor bp regularly any longer.-thx

## 2015-02-20 ENCOUNTER — Encounter: Payer: Self-pay | Admitting: Family Medicine

## 2015-03-24 ENCOUNTER — Encounter: Payer: Self-pay | Admitting: Family Medicine

## 2015-03-24 ENCOUNTER — Ambulatory Visit (INDEPENDENT_AMBULATORY_CARE_PROVIDER_SITE_OTHER): Payer: Medicare Other | Admitting: Family Medicine

## 2015-03-24 VITALS — BP 144/86 | HR 92 | Temp 98.1°F | Resp 16 | Ht 61.0 in | Wt 141.0 lb

## 2015-03-24 DIAGNOSIS — M7062 Trochanteric bursitis, left hip: Secondary | ICD-10-CM | POA: Diagnosis not present

## 2015-03-24 MED ORDER — HYDROCODONE-ACETAMINOPHEN 10-325 MG PO TABS: ORAL_TABLET | ORAL | Status: DC

## 2015-03-24 MED ORDER — LISDEXAMFETAMINE DIMESYLATE 40 MG PO CAPS: ORAL_CAPSULE | ORAL | Status: DC

## 2015-03-24 MED ORDER — PREDNISONE 20 MG PO TABS: 20.0000 mg | ORAL_TABLET | Freq: Every day | ORAL | Status: DC

## 2015-03-24 MED ORDER — HYDROCODONE-ACETAMINOPHEN 10-325 MG PO TABS
ORAL_TABLET | ORAL | Status: DC
Start: 2015-03-24 — End: 2015-05-07

## 2015-03-24 NOTE — Progress Notes (Signed)
Pre visit review using our clinic review tool, if applicable. No additional management support is needed unless otherwise documented below in the visit note. 

## 2015-03-24 NOTE — Progress Notes (Signed)
OFFICE VISIT  03/24/2015   CC:  Chief Complaint  Patient presents with  . Hip Pain    left hip x 6 months off and on     HPI:    Patient is a 74 y.o. Caucasian female who presents for left hip pain for last couple weeks, lateral hip, walking a lot more lately.   Went to [place] orth[facility] in the past for same, dx'd with bursitis and was given steroid injection into bursa and this helped.  Exercises have not helped much this time.  She then says she doesn't hurt much in hip today, just on and off sx's.  No recent fall/trauma.  Feels like her hair is falling out more lately, for about the last 6 months.    Past Medical History  Diagnosis Date  . GERD (gastroesophageal reflux disease)   . Hypothyroidism   . Fibromyalgia   . Migraine syndrome   . Urinary incontinence   . Degenerative disk disease     (02/2011 CT scans).  Cervical (MRI 2001 & 2003).  Hx of thoracic epidural injections via Dr. Noel Geroldohen.  . Scoliosis deformity of spine   . Osteoarthritis   . Chronic fatigue   . Osteoporosis     Bisphosphonates no help.  Prolia caused side effects.  . Insomnia     Ambien occasionally  . Allergic rhinitis due to pollen 11/30/2006    Skin test 10/15/09. Retest 10/30/10 Allergy vaccine d/c'd 09/2012 Significant component of irritant/ vasomotor rhinitis    . COPD (chronic obstructive pulmonary disease) (HCC)     with fibrosis (noted on CXR 01/2012)  . Sacroiliac joint dysfunction   . Elevated blood pressure reading without diagnosis of hypertension     ? White coat HTN?   All home bp's normal.    Past Surgical History  Procedure Laterality Date  . Vaginal hysterectomy  1983    Ovaries still in.  Nonmalignant reason.  . Colonoscopy  01/27/2004    diverticulosis  . Total knee arthroplasty  2008/2011    both knees  . Rectocele repair    . Rotator cuff repair  2011    Right  . Bladder suspension  1983  . Cholecystectomy  2009  . Esophagogastroduodenoscopy endoscopy  1983; 2005    GERD, distal  esoph stricture, no Barrett's esoph  . Bone density  01/2009; 12/2010    T -2.8 to -3.0    Outpatient Prescriptions Prior to Visit  Medication Sig Dispense Refill  . bisacodyl (DULCOLAX) 5 MG EC tablet Take 5 mg by mouth at bedtime as needed.      . cholecalciferol (VITAMIN D) 1000 UNITS tablet Take 1,000 Units by mouth daily.    . folic acid (FOLVITE) 1 MG tablet Take 1 tablet (1 mg total) by mouth daily. 30 tablet 12  . levothyroxine (SYNTHROID) 100 MCG tablet Take 1 tablet (100 mcg total) by mouth daily. 90 tablet 1  . Multiple Vitamins-Minerals (OCUVITE ADULT FORMULA) CAPS Take 1 capsule by mouth daily.     . multivitamin (THERAGRAN) per tablet Take 1 tablet by mouth daily.     . predniSONE (DELTASONE) 2.5 MG tablet Take 1 tablet (2.5 mg total) by mouth daily with breakfast. 30 tablet 6  . SUMAtriptan (IMITREX) 100 MG tablet TAKE 1 TABLET BY MOUTH FOR MIGRAINES 9 tablet 12  . vitamin E 400 UNIT capsule Take 400 Units by mouth daily.    Marland Kitchen. zolpidem (AMBIEN) 10 MG tablet TAKE 1 TABLET BY MOUTH ONCE DAILY  AT BEDTIME 30 tablet 0  . HYDROcodone-acetaminophen (NORCO) 10-325 MG per tablet 1 or 1.5 every 6 hours prn pain 75 tablet 0  . lisdexamfetamine (VYVANSE) 40 MG capsule 1 cap po qAM 30 capsule 0   No facility-administered medications prior to visit.    Allergies  Allergen Reactions  . Terbinafine Hcl Hives    ROS As per HPI  PE: Blood pressure 144/86, pulse 92, temperature 98.1 F (36.7 C), temperature source Oral, resp. rate 16, height  (1.549 m), weight 141 lb (63.957 kg), SpO2 99 %. Gen: Alert, well appearing.  Patient is oriented to person, place, time, and situation. Mild TTP over L greater troch region.  Mild TTp in L lumbar paraspinous muscles. ROM of L hip intact w/out pain.   LABS:  Lab Results  Component Value Date   TSH 0.59 09/02/2014     IMPRESSION AND PLAN:  1) L greater trochanteric bursitis. I told her a don't do these injections, and the best I  could do would be try systemic steroids  qd x 5d burst.  She was agreeable to a try of this.  She will also call her ortho office to see if she can get in with them if this doesn't help significantly.  2) Chronic fatigue syndrome: vyvanse rx renewed x 21mo, approp fill on/after dates on rx's.  3) Chronic pain syndrome: Norco 10/325 rx renewed x 3 rx's each with a approp fill on/after date on it.  An After Visit Summary was printed and given to the patient.  FOLLOW UP: Return if symptoms worsen or fail to improve.

## 2015-03-31 ENCOUNTER — Other Ambulatory Visit: Payer: Self-pay | Admitting: Family Medicine

## 2015-04-01 ENCOUNTER — Other Ambulatory Visit: Payer: Self-pay | Admitting: *Deleted

## 2015-04-01 MED ORDER — LEVOTHYROXINE SODIUM 100 MCG PO TABS: 100.0000 ug | ORAL_TABLET | Freq: Every day | ORAL | Status: DC

## 2015-04-01 NOTE — Telephone Encounter (Signed)
RF request for levothyroxine LOV: 01/02/15 Next ov: 05/07/15 Last written: 12/04/14 #90 w/ 1RF  09/02/14 TSH - 0.59 Normal

## 2015-04-30 ENCOUNTER — Other Ambulatory Visit: Payer: Self-pay | Admitting: Family Medicine

## 2015-05-01 NOTE — Telephone Encounter (Signed)
RF request for prednisone 5mg  LOV: 01/02/15 Next ov: 05/07/15 Last written: 07/24/14 #30 w/ 6RF  Spoke to pt and she stated that she had been taking the 2.5mg  but they were not working well enough so she started back on the 5mg  prednisone. She stated that her old Rx had one RF left so she refilled it. Please advise. Thanks.

## 2015-05-07 ENCOUNTER — Encounter: Payer: Self-pay | Admitting: Family Medicine

## 2015-05-07 ENCOUNTER — Ambulatory Visit (INDEPENDENT_AMBULATORY_CARE_PROVIDER_SITE_OTHER): Payer: Medicare Other | Admitting: Family Medicine

## 2015-05-07 VITALS — BP 124/72 | HR 89 | Temp 97.8°F | Resp 16 | Ht 61.0 in | Wt 140.0 lb

## 2015-05-07 DIAGNOSIS — E039 Hypothyroidism, unspecified: Secondary | ICD-10-CM

## 2015-05-07 DIAGNOSIS — Z23 Encounter for immunization: Secondary | ICD-10-CM

## 2015-05-07 DIAGNOSIS — R5382 Chronic fatigue, unspecified: Secondary | ICD-10-CM | POA: Diagnosis not present

## 2015-05-07 DIAGNOSIS — F909 Attention-deficit hyperactivity disorder, unspecified type: Secondary | ICD-10-CM

## 2015-05-07 DIAGNOSIS — M797 Fibromyalgia: Secondary | ICD-10-CM

## 2015-05-07 DIAGNOSIS — F900 Attention-deficit hyperactivity disorder, predominantly inattentive type: Secondary | ICD-10-CM

## 2015-05-07 DIAGNOSIS — G894 Chronic pain syndrome: Secondary | ICD-10-CM

## 2015-05-07 DIAGNOSIS — Z79899 Other long term (current) drug therapy: Secondary | ICD-10-CM

## 2015-05-07 MED ORDER — LISDEXAMFETAMINE DIMESYLATE 40 MG PO CAPS: ORAL_CAPSULE | ORAL | Status: DC

## 2015-05-07 MED ORDER — HYDROCODONE-ACETAMINOPHEN 10-325 MG PO TABS: ORAL_TABLET | ORAL | Status: DC

## 2015-05-07 MED ORDER — SUMATRIPTAN SUCCINATE 100 MG PO TABS: ORAL_TABLET | ORAL | Status: DC

## 2015-05-07 NOTE — Progress Notes (Signed)
OFFICE VISIT  05/08/2015   CC:  Chief Complaint  Patient presents with  . Follow-up    Pt is not fasting.    HPI:    Patient is a 74 y.o. Caucasian female who presents for 4 mo f/u fibromyalgia/chronic pain syndrome (on chronic pain med and chronic low dose steroids for functioning), hypothyroidism, chronic fatigue with adult ADD sx's.  Compliant with levothyroxine 100 mcg daily.  Chronic fatigue: mild/mod worse lately, seems to be in a slight down phase of her fatigue/fibro.  Still finds vyvanse helpful for fatigue, focus, concentration, "foggy headed" feeling but in her down phases she doesn't feel like it helps as much.  She remains on chronic low dose prednisone for non-inflammatory arthritis/arthralgias.  We review the short and long term risks of this med each visit.   Last eye exam 02/2015: pt says she has glaucoma, cataracts and macular degeneration. She is frustrated with her vision, esp when trying to work at her computer. We did not discuss osteoporosis today, as she has been very noncommittal/indecisive about this topic (monitoring, treatment, endo referral, etc).  Takes vicodin daily for her musculoskeletal pains but usually doesn't go through the entire bottle of 75 tabs allotted on each monthly rx.   Past Medical History  Diagnosis Date  . GERD (gastroesophageal reflux disease)   . Hypothyroidism   . Fibromyalgia   . Migraine syndrome   . Urinary incontinence   . Degenerative disk disease     (02/2011 CT scans).  Cervical (MRI 2001 & 2003).  Hx of thoracic epidural injections via Dr. Noel Gerold.  . Scoliosis deformity of spine   . Osteoarthritis   . Chronic fatigue   . Osteoporosis     Bisphosphonates no help.  Prolia caused side effects.  . Insomnia     Ambien occasionally  . Allergic rhinitis due to pollen 11/30/2006    Skin test 10/15/09. Retest 10/30/10 Allergy vaccine d/c'd 09/2012 Significant component of irritant/ vasomotor rhinitis    . COPD (chronic  obstructive pulmonary disease) (HCC)     with fibrosis (noted on CXR 01/2012)  . Sacroiliac joint dysfunction   . Elevated blood pressure reading without diagnosis of hypertension     ? White coat HTN?   All home bp's normal.    Past Surgical History  Procedure Laterality Date  . Vaginal hysterectomy  1983    Ovaries still in.  Nonmalignant reason.  . Colonoscopy  01/27/2004    diverticulosis  . Total knee arthroplasty  2008/2011    both knees  . Rectocele repair    . Rotator cuff repair  2011    Right  . Bladder suspension  1983  . Cholecystectomy  2009  . Esophagogastroduodenoscopy endoscopy  1983; 2005    GERD, distal esoph stricture, no Barrett's esoph  . Bone density  01/2009; 12/2010    T -2.8 to -3.0    Outpatient Prescriptions Prior to Visit  Medication Sig Dispense Refill  . bisacodyl (DULCOLAX) 5 MG EC tablet Take 5 mg by mouth at bedtime as needed.      . cholecalciferol (VITAMIN D) 1000 UNITS tablet Take 1,000 Units by mouth daily.    Marland Kitchen levothyroxine (SYNTHROID, LEVOTHROID) 100 MCG tablet Take 1 tablet (100 mcg total) by mouth daily. 90 tablet 3  . Multiple Vitamins-Minerals (OCUVITE ADULT FORMULA) CAPS Take 1 capsule by mouth daily.     . multivitamin (THERAGRAN) per tablet Take 1 tablet by mouth daily.     . predniSONE (DELTASONE)  5 MG tablet TAKE 1 TABLET BY MOUTH DAILY 30 tablet 6  . vitamin E 400 UNIT capsule Take 400 Units by mouth daily.    Marland Kitchen. zolpidem (AMBIEN) 10 MG tablet TAKE 1 TABLET BY MOUTH ONCE DAILY AT BEDTIME 30 tablet 0  . folic acid (FOLVITE) 1 MG tablet Take 1 tablet (1 mg total) by mouth daily. 30 tablet 12  . HYDROcodone-acetaminophen (NORCO) 10-325 MG tablet 1 or 1.5 every 6 hours prn pain 75 tablet 0  . lisdexamfetamine (VYVANSE) 40 MG capsule 1 cap po qAM 30 capsule 0  . SUMAtriptan (IMITREX) 100 MG tablet TAKE 1 TABLET BY MOUTH FOR MIGRAINES 9 tablet 12  . predniSONE (DELTASONE) 2.5 MG tablet Take 1 tablet (2.5 mg total) by mouth daily with  breakfast. (Patient not taking: Reported on 05/07/2015) 30 tablet 6   No facility-administered medications prior to visit.    Allergies  Allergen Reactions  . Terbinafine Hcl Hives    ROS As per HPI  PE: Blood pressure 124/72, pulse 89, temperature 97.8 F (36.6 C), temperature source Oral, resp. rate 16, height 5\' 1"  (1.549 m), weight 140 lb (63.504 kg), SpO2 94 %. Gen: Alert, well appearing.  Patient is oriented to person, place, time, and situation. AFFECT: pleasant, lucid thought and speech. No further exam today.  LABS:  Lab Results  Component Value Date   VITAMINB12 862 01/02/2015    Lab Results  Component Value Date   TSH 0.59 09/02/2014     Chemistry      Component Value Date/Time   NA 138 03/05/2013 1603   K 3.5 03/05/2013 1603   CL 100 03/05/2013 1603   CO2 27 03/05/2013 1603   BUN 21 03/05/2013 1603   CREATININE 0.7 03/05/2013 1603      Component Value Date/Time   CALCIUM 9.6 03/05/2013 1603   CALCIUM 10.0 03/05/2013 1603   ALKPHOS 53 03/05/2013 1603   AST 22 03/05/2013 1603   ALT 17 03/05/2013 1603   BILITOT 0.6 03/05/2013 1603     Lab Results  Component Value Date   CHOL 268* 02/12/2010   HDL 103.00 02/12/2010   LDLDIRECT 148.7 02/12/2010   TRIG 126.0 02/12/2010   CHOLHDL 3 02/12/2010   IMPRESSION AND PLAN:  1) Chronic fatigue syndrome/fibromyalgia: a bit worse lately.  She does tend to wax and wane with her sx's. Continue vyvanse 40mg  qd.  I printed rx's for vyvanse 40mg  qd, #30 today for Feb and March 2017.  Appropriate fill on/after date was noted on each rx.  2) Chronic pain: as per pt choice she wants to remain on daily prednisone 1/2-1 tab of 5mg  strength tab. Eye exam UTD--being monitored by ophtho. Osteoporotic but she refuses tx and endo referral. Will check HbA1c, CMET at next f/u visit. Vicodin use: pretty sparingly as per her usual.  I printed rx's for Vicodin 10/325, #75 today for Feb and March 2017.  Appropriate fill  on/after date was noted on each rx.  3) Hypothyroidism: will recheck TSH at next f/u in 4 mo.  4) Lipids: hx of mild LDL elevation 2011 (most recent check in EMR) but HDL was 103! Will see if she wants to recheck this at next f/u visit.  An After Visit Summary was printed and given to the patient.  FOLLOW UP: Return in about 4 months (around 09/05/2015) for routine chronic illness f/u--fasting.

## 2015-05-07 NOTE — Progress Notes (Signed)
Pre visit review using our clinic review tool, if applicable. No additional management support is needed unless otherwise documented below in the visit note. 

## 2015-05-14 ENCOUNTER — Encounter: Payer: Self-pay | Admitting: *Deleted

## 2015-06-26 ENCOUNTER — Other Ambulatory Visit: Payer: Self-pay | Admitting: *Deleted

## 2015-06-26 ENCOUNTER — Encounter: Payer: Self-pay | Admitting: Family Medicine

## 2015-06-26 ENCOUNTER — Ambulatory Visit (INDEPENDENT_AMBULATORY_CARE_PROVIDER_SITE_OTHER): Payer: Medicare Other | Admitting: Family Medicine

## 2015-06-26 VITALS — BP 135/85 | HR 79 | Temp 98.0°F | Resp 16 | Ht 61.0 in | Wt 139.5 lb

## 2015-06-26 DIAGNOSIS — E039 Hypothyroidism, unspecified: Secondary | ICD-10-CM

## 2015-06-26 LAB — TSH: TSH: 0.26 u[IU]/mL — AB (ref 0.35–4.50)

## 2015-06-26 NOTE — Progress Notes (Signed)
Pre visit review using our clinic review tool, if applicable. No additional management support is needed unless otherwise documented below in the visit note. 

## 2015-06-26 NOTE — Progress Notes (Signed)
OFFICE VISIT  06/26/2015   CC:  Chief Complaint  Patient presents with  . Follow-up    thyroid.    HPI:    Patient is a 75 y.o. Caucasian female who presents for f/u hypothyroidism. She is worried that her thyroid hormone is low b/c she is chronically fatigued. However, she has ALWAYS been chronically fatigued, even when her TSH was actually on the low side. She is compliant with levothyroxine, prednisone , and vyvanse daily. Denies depression.   Past Medical History  Diagnosis Date  . GERD (gastroesophageal reflux disease)   . Hypothyroidism   . Fibromyalgia   . Migraine syndrome   . Urinary incontinence   . Degenerative disk disease     (02/2011 CT scans).  Cervical (MRI 2001 & 2003).  Hx of thoracic epidural injections via Dr. Noel Gerold.  . Scoliosis deformity of spine   . Osteoarthritis   . Chronic fatigue   . Osteoporosis     Bisphosphonates no help.  Prolia caused side effects.  . Insomnia     Ambien occasionally  . Allergic rhinitis due to pollen 11/30/2006    Skin test 10/15/09. Retest 10/30/10 Allergy vaccine d/c'd 09/2012 Significant component of irritant/ vasomotor rhinitis    . COPD (chronic obstructive pulmonary disease) (HCC)     with fibrosis (noted on CXR 01/2012)  . Sacroiliac joint dysfunction   . Elevated blood pressure reading without diagnosis of hypertension     ? White coat HTN?   All home bp's normal.    Past Surgical History  Procedure Laterality Date  . Vaginal hysterectomy  1983    Ovaries still in.  Nonmalignant reason.  . Colonoscopy  01/27/2004    diverticulosis  . Total knee arthroplasty  2008/2011    both knees  . Rectocele repair    . Rotator cuff repair  2011    Right  . Bladder suspension  1983  . Cholecystectomy  2009  . Esophagogastroduodenoscopy endoscopy  1983; 2005    GERD, distal esoph stricture, no Barrett's esoph  . Bone density  01/2009; 12/2010    T -2.8 to -3.0    Outpatient Prescriptions Prior to Visit  Medication Sig  Dispense Refill  . bisacodyl (DULCOLAX) 5 MG EC tablet Take 5 mg by mouth at bedtime as needed.      Marland Kitchen HYDROcodone-acetaminophen (NORCO) 10-325 MG tablet 1 or 1.5 every 6 hours prn pain 75 tablet 0  . levothyroxine (SYNTHROID, LEVOTHROID) 100 MCG tablet Take 1 tablet (100 mcg total) by mouth daily. 90 tablet 3  . lisdexamfetamine (VYVANSE) 40 MG capsule 1 cap po qAM 30 capsule 0  . Multiple Vitamins-Minerals (OCUVITE ADULT FORMULA) CAPS Take 1 capsule by mouth daily.     . multivitamin (THERAGRAN) per tablet Take 1 tablet by mouth daily.     . predniSONE (DELTASONE) 5 MG tablet TAKE 1 TABLET BY MOUTH DAILY 30 tablet 6  . SUMAtriptan (IMITREX) 100 MG tablet TAKE 1 TABLET BY MOUTH FOR MIGRAINES 9 tablet 12  . cholecalciferol (VITAMIN D) 1000 UNITS tablet Take 1,000 Units by mouth daily. Reported on 06/26/2015    . vitamin E 400 UNIT capsule Take 400 Units by mouth daily. Reported on 06/26/2015    . zolpidem (AMBIEN) 10 MG tablet TAKE 1 TABLET BY MOUTH ONCE DAILY AT BEDTIME (Patient not taking: Reported on 06/26/2015) 30 tablet 0   No facility-administered medications prior to visit.    Allergies  Allergen Reactions  . Terbinafine Hcl Hives  .  Thimerosal Swelling    Redness    ROS As per HPI  PE: Blood pressure 135/85, pulse 79, temperature 98 F (36.7 C), temperature source Oral, resp. rate 16, height  (1.549 m), weight 139 lb 8 oz (63.277 kg), SpO2 97 %. Gen: Alert, well appearing.  Patient is oriented to person, place, time, and situation. AFFECT: pleasant, lucid thought and speech. No further exam today.  LABS:   Lab Results  Component Value Date   TSH 0.59 09/02/2014   Lab Results  Component Value Date   WBC 7.1 03/05/2013   HGB 13.6 03/05/2013   HCT 40.1 03/05/2013   MCV 92.3 03/05/2013   PLT 182.0 03/05/2013   Lab Results  Component Value Date   CREATININE 0.7 03/05/2013   BUN 21 03/05/2013   NA 138 03/05/2013   K 3.5 03/05/2013   CL 100 03/05/2013   CO2 27  03/05/2013   Lab Results  Component Value Date   ALT 17 03/05/2013   AST 22 03/05/2013   ALKPHOS 53 03/05/2013   BILITOT 0.6 03/05/2013   Lab Results  Component Value Date   CHOL 268* 02/12/2010   Lab Results  Component Value Date   HDL 103.00 02/12/2010    Lab Results  Component Value Date   TRIG 126.0 02/12/2010   Lab Results  Component Value Date   CHOLHDL 3 02/12/2010   IMPRESSION AND PLAN:  Hypothyroidism: due for TSH today. Continue current levothyroxine dosing for now. We could not dedicate any substantial time today in further eval of her chronic fatigue, so I asked her to make a separate 30 min f/u appt to discuss this problem in depth.  An After Visit Summary was printed and given to the patient.  FOLLOW UP: Return for 30 min o/v at pt's convenience to evaluate fatigue.

## 2015-08-27 ENCOUNTER — Other Ambulatory Visit: Payer: Self-pay | Admitting: *Deleted

## 2015-08-27 MED ORDER — LISDEXAMFETAMINE DIMESYLATE 40 MG PO CAPS: ORAL_CAPSULE | ORAL | Status: DC

## 2015-08-27 NOTE — Telephone Encounter (Signed)
Pt called requesting refill. Would like to p/u Rx tomorrow.   RF request for vyvanse LOV: 06/26/15 Next ov: None Last written: 05/07/15 #30 w/ [id number]  Please advise. Thanks.

## 2015-08-27 NOTE — Telephone Encounter (Signed)
Rx up front for p/u. Pt advised and voiced understanding.   

## 2015-09-05 ENCOUNTER — Ambulatory Visit: Payer: Medicare Other | Admitting: Family Medicine

## 2015-09-08 ENCOUNTER — Encounter: Payer: Self-pay | Admitting: Family Medicine

## 2015-09-08 ENCOUNTER — Ambulatory Visit (INDEPENDENT_AMBULATORY_CARE_PROVIDER_SITE_OTHER): Payer: Medicare Other | Admitting: Family Medicine

## 2015-09-08 VITALS — BP 140/83 | HR 85 | Temp 97.8°F | Resp 16 | Ht 60.75 in | Wt 141.2 lb

## 2015-09-08 DIAGNOSIS — E039 Hypothyroidism, unspecified: Secondary | ICD-10-CM

## 2015-09-08 DIAGNOSIS — Z0000 Encounter for general adult medical examination without abnormal findings: Secondary | ICD-10-CM

## 2015-09-08 LAB — TSH: TSH: 1.35 u[IU]/mL (ref 0.35–4.50)

## 2015-09-08 NOTE — Progress Notes (Signed)
Pre visit review using our clinic review tool, if applicable. No additional management support is needed unless otherwise documented below in the visit note. 

## 2015-09-08 NOTE — Progress Notes (Signed)
The patient is here for annual Medicare wellness examination and management of other chronic and acute problems.  Other problems discussed today: repeat TSH today to f/u the result of a dose change that was made in response to mildly low TSH a little over 2 months ago.   AWV DATA The risk factors are reflected in the social history.  The roster of all physicians providing medical care to patient is listed in the Snapshot section of the chart.  Activities of daily living:  The patient is 100% independent in all ADLs: dressing, toileting, feeding as well as independent mobility.  Home safety : The patient has smoke detectors in the home. They wear seatbelts. No firearms at home ( firearms are present in the home, kept in a safe fashion). There is no violence in the home.   There is no risks for hepatitis, STDs or HIV. There is no history of blood transfusion. They have no travel history to infectious disease endemic areas of the world.  The patient has seen their dentist in the last six month. They have seen their eye doctor in the last year. They admit to hearing difficulty and has hearing aids.  They do not  have excessive sun exposure. Discussed the need for sun protection: hats, long sleeves and use of sunscreen if there is significant sun exposure.   Diet: the importance of a healthy diet is discussed. They do have a fairly healthy diet.  The patient does not have a regular exercise regimen.  She tries to stay active.  The benefits of regular aerobic exercise were discussed.  Depression screen: there are no signs or vegative symptoms of depression- irritability, change in appetite, anhedonia, sadness/tearfullness.  Cognitive assessment: the patient manages all their financial and personal affairs and is actively engaged. They could relate day,date,year and events; recalled 3/3 objects at 3 minutes; performed clock-face test normally.  Reviewed advanced directives with pt today.  The  following portions of the patient's history were reviewed and updated as appropriate: allergies, current medications, past family history, past medical history,  past surgical history, past social history  and problem list.  Vision, hearing, body mass index were assessed and reviewed.   Body mass index is 26.91 kg/(m^2).  During the course of the visit the patient was educated and counseled about appropriate screening and preventive services including :  Annual wellness visit--today. diabetes screening--pt qualifies due to BMI in overweight category. colorectal cancer screening: pt not sure---we need to call [facility] and inquire (Dr. Marina GoodellPerry did her past colonoscopies). recommended immunizations (influenza, pneumococcal, Hep B): pt is UTD on these. Bone mass measurement: pt has been treated for osteoporosis with bisphosphonates.  She got prolia injection x 1 and says she doesn't want any more.   Counseling to prevent tobacco use: N/A Depression screening: today--negative Glaucoma screening: to be done by her eye MD. Hepatitis C virus screening: declines HIV virus screening: declines Lung cancer screening: N/A Medical nutrition therapy: N/A Prostate cancer screening: N/A Screening mammography: gets this through her gynecologist. Screening pap tests, pelvic exam, and clinical breast exam: gets these through her GYN MD. Ultrasound screening for AAA: pt not a candidate.  A written plan of action regarding the above screening and preventative services was given to the patient today.  PLAN: pt will get a fasting glucose drawn at a future visit when she is fasting.    An After Visit Summary was printed and given to the patient.  Signed:  Santiago BumpersPhil Lavender Stanke, MD  09/08/2015  

## 2015-09-09 ENCOUNTER — Other Ambulatory Visit: Payer: Self-pay | Admitting: Family Medicine

## 2015-09-09 MED ORDER — LEVOTHYROXINE SODIUM 50 MCG PO TABS: ORAL_TABLET | ORAL | Status: DC

## 2015-09-22 ENCOUNTER — Telehealth: Payer: Self-pay | Admitting: Family Medicine

## 2015-09-22 NOTE — Telephone Encounter (Signed)
Hydrocodone

## 2015-09-23 ENCOUNTER — Other Ambulatory Visit: Payer: Self-pay | Admitting: Family Medicine

## 2015-09-23 MED ORDER — HYDROCODONE-ACETAMINOPHEN 10-325 MG PO TABS: ORAL_TABLET | ORAL | Status: DC

## 2015-09-23 NOTE — Telephone Encounter (Signed)
Vicodin Rx printed.

## 2015-09-23 NOTE — Telephone Encounter (Signed)
Called patient to confirm message received. Advised Dr. Milinda CaveMcGowen not in the office today, but will fwd message. Patient verbalized understanding.

## 2015-09-24 NOTE — Telephone Encounter (Signed)
Called patient and informed Rx ready for pick up at front desk. Patient verbalized understanding.  

## 2015-10-29 ENCOUNTER — Other Ambulatory Visit: Payer: Self-pay | Admitting: *Deleted

## 2015-10-29 MED ORDER — HYDROCODONE-ACETAMINOPHEN 10-325 MG PO TABS: ORAL_TABLET | ORAL | Status: DC

## 2015-10-29 NOTE — Telephone Encounter (Signed)
RF request for hydrocodone/apap LOV: 09/08/15 Next ov: None Last written: 09/23/15 #75 w/ 0RF  Please advise. Thanks.

## 2015-10-30 NOTE — Telephone Encounter (Signed)
Rx put up front for p/u. Pts husband advised and voiced understanding.  

## 2015-11-24 ENCOUNTER — Other Ambulatory Visit: Payer: Self-pay | Admitting: *Deleted

## 2015-11-24 ENCOUNTER — Other Ambulatory Visit: Payer: Self-pay | Admitting: Family Medicine

## 2015-11-24 MED ORDER — HYDROCODONE-ACETAMINOPHEN 10-325 MG PO TABS: ORAL_TABLET | ORAL | Status: DC

## 2015-11-24 NOTE — Telephone Encounter (Signed)
Pt advised and voiced understanding.   

## 2015-11-24 NOTE — Telephone Encounter (Signed)
RF request for hydro/apap LOV: 09/08/15 Next ov: None Last written: 10/29/15 #75 w/ 0RF  Please advise. Thanks.

## 2015-11-25 NOTE — Telephone Encounter (Signed)
RF request for prednisone LOV: 09/08/15 Next ov: 12/09/15 Last written: 05/01/15 330 w/ 6RF  Please advise. Thanks.

## 2015-12-09 ENCOUNTER — Ambulatory Visit (INDEPENDENT_AMBULATORY_CARE_PROVIDER_SITE_OTHER): Payer: Medicare Other | Admitting: Family Medicine

## 2015-12-09 ENCOUNTER — Encounter: Payer: Self-pay | Admitting: Family Medicine

## 2015-12-09 VITALS — BP 139/92 | HR 71 | Temp 97.6°F | Resp 16 | Ht 60.75 in | Wt 141.2 lb

## 2015-12-09 DIAGNOSIS — M159 Polyosteoarthritis, unspecified: Secondary | ICD-10-CM

## 2015-12-09 DIAGNOSIS — E039 Hypothyroidism, unspecified: Secondary | ICD-10-CM

## 2015-12-09 DIAGNOSIS — R5382 Chronic fatigue, unspecified: Secondary | ICD-10-CM

## 2015-12-09 DIAGNOSIS — L659 Nonscarring hair loss, unspecified: Secondary | ICD-10-CM

## 2015-12-09 DIAGNOSIS — T380X5A Adverse effect of glucocorticoids and synthetic analogues, initial encounter: Secondary | ICD-10-CM

## 2015-12-09 DIAGNOSIS — R739 Hyperglycemia, unspecified: Secondary | ICD-10-CM | POA: Diagnosis not present

## 2015-12-09 DIAGNOSIS — M150 Primary generalized (osteo)arthritis: Secondary | ICD-10-CM

## 2015-12-09 MED ORDER — HYDROCODONE-ACETAMINOPHEN 10-325 MG PO TABS: ORAL_TABLET | ORAL | 0 refills | Status: DC

## 2015-12-09 NOTE — Progress Notes (Signed)
OFFICE VISIT  12/09/2015   CC:  Chief Complaint  Patient presents with  . Follow-up    Pt is not fasting.    HPI:    Patient is a 75 y.o. Caucasian female who presents for 6 mo f/u hypothyroidism, osteoarthritis, fibromyalgia. Takes 2 Anacin bid for myalgias/arthralgias, says this has helped her to ween down her use of vicodin. Not taking vyvanse anymore for last few months.  She wanted to get off ot this med. She feels good but still c/o hair loss--diffuse on scalp, w/out any large patches of hair loss. She asked about possibly using minoxodil.  She still has chronic fatigue but this has not been any worse lately than normal. No fevers, no abnl wt loss, no joint swelling.  Denies polydipsia or polyuria.   She has been compliant with latest thyroid med dosing of 100 mcg qd except take 150 mcg on two days per week (for the last couple of months).      Past Medical History:  Diagnosis Date  . Allergic rhinitis due to pollen 11/30/2006   Skin test 10/15/09. Retest 10/30/10 Allergy vaccine d/c'd 09/2012 Significant component of irritant/ vasomotor rhinitis    . Chronic fatigue   . 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 Degenerative disk disease    (02/2011 CT scans).  Cervical (MRI 2001 & 2003).  Hx of thoracic epidural injections via Dr. Noel Gerold.  . Elevated blood pressure reading without diagnosis of hypertension    ? White coat HTN?   All home bp's normal.  . Fibromyalgia   . GERD (gastroesophageal reflux disease)   . Hypothyroidism   . Insomnia    Ambien occasionally  . Migraine syndrome   . Osteoarthritis   . Osteoporosis    Bisphosphonates no help.  Prolia caused side effects.  . Sacroiliac joint dysfunction   . Scoliosis deformity of spine   . Urinary incontinence     Past Surgical History:  Procedure Laterality Date  . BLADDER SUSPENSION  1983  . Bone density  01/2009; 12/2010   T -2.8 to -3.0  . CHOLECYSTECTOMY   2009  . COLONOSCOPY  01/27/2004   diverticulosis  . ESOPHAGOGASTRODUODENOSCOPY ENDOSCOPY  1983; 2005   GERD, distal esoph stricture, no Barrett's esoph  . RECTOCELE REPAIR    . ROTATOR CUFF REPAIR  2011   Right  . TOTAL KNEE ARTHROPLASTY  2008/2011   both knees  . VAGINAL HYSTERECTOMY  1983   Ovaries still in.  Nonmalignant reason.    Outpatient Medications Prior to Visit  Medication Sig Dispense Refill  . bisacodyl (DULCOLAX) 5 MG EC tablet Take 5 mg by mouth at bedtime as needed.      . fexofenadine (ALLEGRA) 180 MG tablet Take 180 mg by mouth daily.    Marland Kitchen levothyroxine (SYNTHROID, LEVOTHROID) 100 MCG tablet Take 1 tablet (100 mcg total) by mouth daily. 90 tablet 3  . levothyroxine (SYNTHROID, LEVOTHROID) 50 MCG tablet 1 tab po on mondays and wednesdays 24 tablet 3  . Multiple Vitamins-Minerals (OCUVITE ADULT FORMULA) CAPS Take 1 capsule by mouth daily.     . multivitamin (THERAGRAN) per tablet Take 1 tablet by mouth daily.     . predniSONE (DELTASONE) 5 MG tablet TAKE 1 TABLET BY MOUTH DAILY 30 tablet 6  . SUMAtriptan (IMITREX) 100 MG tablet TAKE 1 TABLET BY MOUTH FOR MIGRAINES 9 tablet 12  . HYDROcodone-acetaminophen (NORCO) 10-325 MG tablet 1 or 1.5  every 6 hours prn pain 75 tablet 0  . lisdexamfetamine (VYVANSE) 40 MG capsule 1 cap po qAM (Patient not taking: Reported on 12/09/2015) 30 capsule 0   No facility-administered medications prior to visit.     Allergies  Allergen Reactions  . Terbinafine Hcl Hives  . Thimerosal Swelling    Redness    ROS As per HPI  PE: Blood pressure (!) 139/92, pulse 71, temperature 97.6 F (36.4 C), temperature source Oral, resp. rate 16, height 5' 0.75" (1.543 m), weight 141 lb 4 oz (64.1 kg), SpO2 97 %. Gen: Alert, well appearing.  Patient is oriented to person, place, time, and situation. AFFECT: pleasant, lucid thought and speech. No further exam today.  LABS:  Lab Results  Component Value Date   TSH 1.35 09/08/2015   Lab  Results  Component Value Date   WBC 7.1 03/05/2013   HGB 13.6 03/05/2013   HCT 40.1 03/05/2013   MCV 92.3 03/05/2013   PLT 182.0 03/05/2013   Lab Results  Component Value Date   CREATININE 0.7 03/05/2013   BUN 21 03/05/2013   NA 138 03/05/2013   K 3.5 03/05/2013   CL 100 03/05/2013   CO2 27 03/05/2013   Lab Results  Component Value Date   ALT 17 03/05/2013   AST 22 03/05/2013   ALKPHOS 53 03/05/2013   BILITOT 0.6 03/05/2013   Lab Results  Component Value Date   CHOL 268 (H) 02/12/2010   Lab Results  Component Value Date   HDL 103.00 02/12/2010   No results found for: [facility] Lab Results  Component Value Date   TRIG 126.0 02/12/2010   Lab Results  Component Value Date   CHOLHDL 3 02/12/2010    IMPRESSION AND PLAN:  1) Hypothyroidism: chronic fatigue and ongoing hair loss still.  Recheck TSH today.  2) Non-scarring hair loss: check iron, TIBC, ferritin, CBC today, as well as TSH.  3) Osteoarthritis, multiple sites+ fibromyalgia: she has been put on low dose prednisone in the remote past and this has helped her.  She has had w/u for inflammatory arthritis and this was neg. We have discussed the possible long term effects of low dose systemic steroid use and she continues to accept this risk. Will check HbA1c today. RF'd vicodin rx and handed it to pt today: she uses this pretty sparingly but I warned her also of the potential side effects of taking Anacin 1-2 times per day.  Pt expressed understanding.  4) Chronic fatigue syndrome: vyvanse did not seem to help with this or with her concentration/focus. No new meds or w/u for this problem today, other than CBC, CMET, and TSH monitoring.  An After Visit Summary was printed and given to the patient.   FOLLOW UP: Return in about 6 months (around 06/10/2016) for routine chronic illness f/u.  Signed:  Santiago Bumpers, MD           12/09/2015

## 2015-12-09 NOTE — Progress Notes (Signed)
Pre visit review using our clinic review tool, if applicable. No additional management support is needed unless otherwise documented below in the visit note. 

## 2015-12-11 ENCOUNTER — Other Ambulatory Visit (INDEPENDENT_AMBULATORY_CARE_PROVIDER_SITE_OTHER): Payer: Medicare Other

## 2015-12-11 ENCOUNTER — Telehealth: Payer: Self-pay | Admitting: *Deleted

## 2015-12-11 DIAGNOSIS — E039 Hypothyroidism, unspecified: Secondary | ICD-10-CM | POA: Diagnosis not present

## 2015-12-11 DIAGNOSIS — T380X5A Adverse effect of glucocorticoids and synthetic analogues, initial encounter: Secondary | ICD-10-CM

## 2015-12-11 DIAGNOSIS — R5382 Chronic fatigue, unspecified: Secondary | ICD-10-CM | POA: Diagnosis not present

## 2015-12-11 DIAGNOSIS — R739 Hyperglycemia, unspecified: Secondary | ICD-10-CM

## 2015-12-11 DIAGNOSIS — L659 Nonscarring hair loss, unspecified: Secondary | ICD-10-CM

## 2015-12-11 LAB — IRON AND TIBC
%SAT: 22 % (ref 11–50)
IRON: 85 ug/dL (ref 45–160)
TIBC: 390 ug/dL (ref 250–450)
UIBC: 305 ug/dL (ref 125–400)

## 2015-12-11 LAB — CBC WITH DIFFERENTIAL/PLATELET
BASOS PCT: 0.6 % (ref 0.0–3.0)
Basophils Absolute: 0 10*3/uL (ref 0.0–0.1)
EOS ABS: 0.2 10*3/uL (ref 0.0–0.7)
EOS PCT: 3.7 % (ref 0.0–5.0)
HCT: 41.2 % (ref 36.0–46.0)
HEMOGLOBIN: 13.7 g/dL (ref 12.0–15.0)
Lymphocytes Relative: 43.4 % (ref 12.0–46.0)
Lymphs Abs: 2.8 10*3/uL (ref 0.7–4.0)
MCHC: 33.2 g/dL (ref 30.0–36.0)
MCV: 92.3 fl (ref 78.0–100.0)
MONO ABS: 0.5 10*3/uL (ref 0.1–1.0)
Monocytes Relative: 8.2 % (ref 3.0–12.0)
NEUTROS ABS: 2.8 10*3/uL (ref 1.4–7.7)
Neutrophils Relative %: 44.1 % (ref 43.0–77.0)
PLATELETS: 188 10*3/uL (ref 150.0–400.0)
RBC: 4.46 Mil/uL (ref 3.87–5.11)
RDW: 13.9 % (ref 11.5–15.5)
WBC: 6.4 10*3/uL (ref 4.0–10.5)

## 2015-12-11 LAB — FERRITIN: FERRITIN: 19.8 ng/mL (ref 10.0–291.0)

## 2015-12-11 LAB — HEMOGLOBIN A1C: Hgb A1c MFr Bld: 5.6 % (ref 4.6–6.5)

## 2015-12-11 LAB — TSH: TSH: 0.44 u[IU]/mL (ref 0.35–4.50)

## 2015-12-11 MED ORDER — HYDROCODONE-ACETAMINOPHEN 10-325 MG PO TABS: ORAL_TABLET | ORAL | 0 refills | Status: DC

## 2015-12-11 NOTE — Telephone Encounter (Signed)
Spoke with Dr. Milinda Cave, he gave verbal order to print new Rx for pt to come by and p/u. Rx printed, signed and put up front for p/u.

## 2015-12-11 NOTE — Telephone Encounter (Signed)
Pt LMOM on 12/11/15 at 3:57pm stating that she could not find Rx in the paper work that was given to her at her last ov on 12/09/15. She is requesting new Rx to come by and p/u. Please advise. Thanks.

## 2015-12-11 NOTE — Telephone Encounter (Signed)
Agree/noted. 

## 2015-12-11 NOTE — Telephone Encounter (Signed)
Pt advised and voiced understanding.   

## 2015-12-12 ENCOUNTER — Other Ambulatory Visit: Payer: Self-pay | Admitting: *Deleted

## 2015-12-12 MED ORDER — LEVOTHYROXINE SODIUM 112 MCG PO TABS: ORAL_TABLET | ORAL | 6 refills | Status: DC

## 2016-01-16 ENCOUNTER — Other Ambulatory Visit: Payer: Self-pay | Admitting: Family Medicine

## 2016-01-16 NOTE — Telephone Encounter (Signed)
Pt requesting RF of Vyvanse.  This medication is not in her current med list.  Please advise.

## 2016-01-20 NOTE — Telephone Encounter (Signed)
LMOM for pt to P/U rx at front desk.

## 2016-01-22 ENCOUNTER — Telehealth: Payer: Self-pay

## 2016-01-22 MED ORDER — HYDROCODONE-ACETAMINOPHEN 10-325 MG PO TABS: ORAL_TABLET | ORAL | 0 refills | Status: DC

## 2016-01-22 NOTE — Telephone Encounter (Signed)
RF Norco 10-325 mg Last refill was 12/11/15 1 or 1.5 every 6 hours Next appointment 06/11/2015 Last ov 12/11/2015

## 2016-01-22 NOTE — Telephone Encounter (Signed)
Vicodin rx printed. 

## 2016-01-22 NOTE — Telephone Encounter (Signed)
Patient notified and will pick up today 01/22/16 or tomorrow 01/23/16

## 2016-02-23 ENCOUNTER — Telehealth: Payer: Self-pay

## 2016-02-23 ENCOUNTER — Other Ambulatory Visit: Payer: Self-pay

## 2016-02-23 MED ORDER — HYDROCODONE-ACETAMINOPHEN 10-325 MG PO TABS: ORAL_TABLET | ORAL | 0 refills | Status: DC

## 2016-02-23 NOTE — Telephone Encounter (Signed)
Patient called requesting refill on Hydrocodone and stated that husband would come by to pick up when ready.

## 2016-02-23 NOTE — Telephone Encounter (Signed)
Pt request RF of hydrocodone Last Rx printed 01/22/16.  Please advise.

## 2016-02-24 ENCOUNTER — Other Ambulatory Visit: Payer: Self-pay | Admitting: Family Medicine

## 2016-02-24 MED ORDER — LISDEXAMFETAMINE DIMESYLATE 40 MG PO CAPS: ORAL_CAPSULE | ORAL | 0 refills | Status: DC

## 2016-02-24 NOTE — Telephone Encounter (Signed)
LMOM for pt to P/U RX at front desk.

## 2016-02-24 NOTE — Telephone Encounter (Signed)
Rx placed at front desk for pt pick up

## 2016-02-24 NOTE — Telephone Encounter (Signed)
Patient forgot to request RF of vyvanse.  Last RX 01/16/16.   Please advise.

## 2016-03-01 NOTE — Telephone Encounter (Signed)
Opened in error

## 2016-03-24 ENCOUNTER — Other Ambulatory Visit: Payer: Self-pay | Admitting: Family Medicine

## 2016-03-24 MED ORDER — LISDEXAMFETAMINE DIMESYLATE 40 MG PO CAPS: ORAL_CAPSULE | ORAL | 0 refills | Status: DC

## 2016-03-24 MED ORDER — HYDROCODONE-ACETAMINOPHEN 10-325 MG PO TABS: ORAL_TABLET | ORAL | 0 refills | Status: DC

## 2016-03-24 NOTE — Telephone Encounter (Signed)
Patient notified that prescription is ready to be picked up. 

## 2016-03-24 NOTE — Telephone Encounter (Signed)
Vyvanse and Vicodin rx's printed as per pt's request.

## 2016-04-19 ENCOUNTER — Other Ambulatory Visit: Payer: Self-pay | Admitting: *Deleted

## 2016-04-19 MED ORDER — LISDEXAMFETAMINE DIMESYLATE 40 MG PO CAPS: ORAL_CAPSULE | ORAL | 0 refills | Status: DC

## 2016-04-19 MED ORDER — HYDROCODONE-ACETAMINOPHEN 10-325 MG PO TABS: ORAL_TABLET | ORAL | 0 refills | Status: DC

## 2016-04-19 NOTE — Telephone Encounter (Signed)
Rx's printed/signed and put up front for p/u. Pt advised and voiced understanding.   

## 2016-04-19 NOTE — Telephone Encounter (Signed)
Pt LMOM on 04/19/16 at 9:57am requesting refill.   LOV: 12/09/15 NOV: 06/10/16  RF request for hydrocodone/apap Last written: 03/24/16 #75 w/ 0RF  RF request for vyvance Last written: 03/24/16 #30 w/ 0RF  Please advise. Thanks.

## 2016-05-18 ENCOUNTER — Other Ambulatory Visit: Payer: Self-pay | Admitting: Family Medicine

## 2016-05-18 NOTE — Telephone Encounter (Signed)
RF request for sumatriptan LOV: 12/11/15 Next ov: 06/10/16 Last written: 05/07/15 #9 w/ 12RF  Please advise. Thanks.

## 2016-05-19 ENCOUNTER — Other Ambulatory Visit: Payer: Self-pay | Admitting: Family Medicine

## 2016-05-19 MED ORDER — HYDROCODONE-ACETAMINOPHEN 10-325 MG PO TABS: ORAL_TABLET | ORAL | 0 refills | Status: DC

## 2016-05-19 NOTE — Telephone Encounter (Signed)
Rx printed, signed and put up front for p/u.  Pt advised and voiced understanding.   

## 2016-05-19 NOTE — Telephone Encounter (Signed)
RF request for hydro/apap LOV: 12/09/15 Next ov: 06/10/16 Last written: 04/19/16 #75 w/ 0Rf  Please advise. Thanks.

## 2016-05-19 NOTE — Telephone Encounter (Signed)
Pt would like to have a refill on her hydrocodone 10/325 available to be picked up on Friday.

## 2016-05-20 ENCOUNTER — Other Ambulatory Visit: Payer: Self-pay | Admitting: *Deleted

## 2016-05-20 NOTE — Telephone Encounter (Signed)
RF request for Vyvanse LOV: 12/09/15 Next ov: 06/10/16 Last written: 04/19/16 #30 w/ [id number]  Please advise. Thanks.

## 2016-05-20 NOTE — Telephone Encounter (Signed)
Patient called requesting refill on Vyvanse . Patient aware Dr Milinda CaveMcGowen out of office until Monday. Please call patient when Rx ready.

## 2016-05-23 MED ORDER — LISDEXAMFETAMINE DIMESYLATE 40 MG PO CAPS: ORAL_CAPSULE | ORAL | 0 refills | Status: DC

## 2016-05-24 NOTE — Telephone Encounter (Signed)
Patient notified and verbalized understanding. 

## 2016-06-10 ENCOUNTER — Ambulatory Visit (INDEPENDENT_AMBULATORY_CARE_PROVIDER_SITE_OTHER): Payer: Medicare Other | Admitting: Family Medicine

## 2016-06-10 ENCOUNTER — Encounter: Payer: Self-pay | Admitting: Family Medicine

## 2016-06-10 VITALS — BP 160/82 | HR 92 | Temp 98.1°F | Resp 16 | Ht 60.75 in | Wt 144.8 lb

## 2016-06-10 DIAGNOSIS — R030 Elevated blood-pressure reading, without diagnosis of hypertension: Secondary | ICD-10-CM

## 2016-06-10 DIAGNOSIS — G894 Chronic pain syndrome: Secondary | ICD-10-CM

## 2016-06-10 DIAGNOSIS — F909 Attention-deficit hyperactivity disorder, unspecified type: Secondary | ICD-10-CM

## 2016-06-10 DIAGNOSIS — E039 Hypothyroidism, unspecified: Secondary | ICD-10-CM

## 2016-06-10 DIAGNOSIS — Z0000 Encounter for general adult medical examination without abnormal findings: Secondary | ICD-10-CM

## 2016-06-10 DIAGNOSIS — M797 Fibromyalgia: Secondary | ICD-10-CM

## 2016-06-10 MED ORDER — LISDEXAMFETAMINE DIMESYLATE 40 MG PO CAPS: ORAL_CAPSULE | ORAL | 0 refills | Status: DC

## 2016-06-10 MED ORDER — HYDROCODONE-ACETAMINOPHEN 10-325 MG PO TABS: ORAL_TABLET | ORAL | 0 refills | Status: DC

## 2016-06-10 NOTE — Progress Notes (Signed)
OFFICE VISIT  06/13/2016   CC:  Chief Complaint  Patient presents with  . Follow-up    RCI,     HPI:    Patient is a 76 y.o. Caucasian female who presents for 6 mo f/u chronic pain secondary to fibromyalgia and DDD back pain, hypothyroidism, and chronic fatigue. She saw a different PCP summer 2017 b/c she heard she was good--[facility] in [place]--and was referred to rheumatologist in [place] and was told there was nothing that could be done for her musculoskeletal complaints.  Now Mrs.  Secrist says she is not going to continue care with [facility] in [facility].  I reviewed the  controlled substances online database and there were no rx's filled by any provider other than myself. Indication for chronic opioid: chronic generalized myofascial pain (fibromyalgia) Medication and dose: Vicodin 10/325, 1-1.5 tabs po q6h prn # pills per month: 75 Last UDS date: 07/27/13 Pain contract signed (Y/N): Yes Date narcotic database last reviewed (include red flags): 06/10/16.  No red flags.  Lots of hip and leg pain, went to Dr. Charlann Boxer, back was x-rayed/MRI'd and she had a pinched nerve but surgery not an option b/c of her scoliosis.  She is scheduled to have an epidural pain shot in 1 week.  Dr. Charlann Boxer also put her on a prednisone regimen/taper.  She asked if she could increase her maintenance prednisone dosing to 10mg  qd.  I said I was not willing to rx this dosing b/c long term risks outweigh potential benefits AND I reiterated to her that I really want her to ween off the 5mg  qd like we have discussed/agreed to in the past b/c she has never been dx'd with an inflammatory arthritis or autoimmune condition that would warrant steroids.      BP checks still normal at home. She is compliant with levothyroxine daily.    Vyvanse is helpful for focus/concentration/motivation/energy.   Past Medical History:  Diagnosis Date  . Allergic rhinitis due to pollen 11/30/2006   Skin test 10/15/09. Retest 10/30/10 Allergy  vaccine d/c'd 09/2012 Significant component of irritant/ vasomotor rhinitis    . Chronic fatigue   . 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 Degenerative disk disease    (02/2011 CT scans).  Cervical (MRI 2001 & 2003).  Hx of thoracic epidural injections via Dr. Noel Gerold.  . Elevated blood pressure reading without diagnosis of hypertension    ? White coat HTN?   All home bp's normal.  . Fibromyalgia   . GERD (gastroesophageal reflux disease)   . Hypothyroidism   . Insomnia    Ambien occasionally  . Migraine syndrome   . Osteoarthritis   . Osteoporosis    Bisphosphonates no help.  Prolia caused side effects.  . Sacroiliac joint dysfunction   . Scoliosis deformity of spine   . Urinary incontinence     Past Surgical History:  Procedure Laterality Date  . BLADDER SUSPENSION  1983  . Bone density  01/2009; 12/2010   T -2.8 to -3.0  . CHOLECYSTECTOMY  2009  . COLONOSCOPY  01/27/2004   diverticulosis  . ESOPHAGOGASTRODUODENOSCOPY ENDOSCOPY  1983; 2005   GERD, distal esoph stricture, no Barrett's esoph  . RECTOCELE REPAIR    . ROTATOR CUFF REPAIR  2011   Right  . TOTAL KNEE ARTHROPLASTY  2008/2011   both knees  . VAGINAL HYSTERECTOMY  1983   Ovaries still in.  Nonmalignant reason.    Outpatient  Medications Prior to Visit  Medication Sig Dispense Refill  . bisacodyl (DULCOLAX) 5 MG EC tablet Take 5 mg by mouth at bedtime as needed.      Marland Kitchen. levothyroxine (SYNTHROID, LEVOTHROID) 112 MCG tablet Take 1 tablet daily 30 tablet 6  . Multiple Vitamins-Minerals (OCUVITE ADULT FORMULA) CAPS Take 1 capsule by mouth daily.     . multivitamin (THERAGRAN) per tablet Take 1 tablet by mouth daily.     . predniSONE (DELTASONE) 5 MG tablet TAKE 1 TABLET BY MOUTH DAILY 30 tablet 6  . SUMAtriptan (IMITREX) 100 MG tablet TAKE 1 TABLET BY MOUTH FOR MIGRAINES 9 tablet 12  . HYDROcodone-acetaminophen (NORCO) 10-325 MG tablet 1 or 1.5 every 6 hours prn  pain 75 tablet 0  . lisdexamfetamine (VYVANSE) 40 MG capsule TAKE 1 CAPSULE BY MOUTH IN THE MORNING 30 capsule 0  . fexofenadine (ALLEGRA) 180 MG tablet Take 180 mg by mouth daily.     No facility-administered medications prior to visit.     Allergies  Allergen Reactions  . Terbinafine Hcl Hives  . Thimerosal Swelling    Redness    ROS As per HPI  PE: Blood pressure (!) 160/82, pulse 92, temperature 98.1 F (36.7 C), temperature source Oral, resp. rate 16, height 5' 0.75" (1.543 m), weight 144 lb 12 oz (65.7 kg), SpO2 99 %. Gen: Alert, well appearing.  Patient is oriented to person, place, time, and situation. CV: RRR, no m/r/g.   LUNGS: CTA bilat, nonlabored resps, good aeration in all lung fields. EXT: bilat LL non-pitting edema.  No cyanosis or clubbing.    LABS:  Lab Results  Component Value Date   TSH 0.10 (L) 06/10/2016   Lab Results  Component Value Date   WBC 6.4 12/11/2015   HGB 13.7 12/11/2015   HCT 41.2 12/11/2015   MCV 92.3 12/11/2015   PLT 188.0 12/11/2015   Lab Results  Component Value Date   CREATININE 0.61 06/10/2016   BUN 22 06/10/2016   NA 142 06/10/2016   K 4.4 06/10/2016   CL 105 06/10/2016   CO2 28 06/10/2016   Lab Results  Component Value Date   ALT 17 03/05/2013   AST 22 03/05/2013   ALKPHOS 53 03/05/2013   BILITOT 0.6 03/05/2013   Lab Results  Component Value Date   CHOL 268 (H) 02/12/2010   Lab Results  Component Value Date   HDL 103.00 02/12/2010   [facility] results found for: [facility] Lab Results  Component Value Date   TRIG 126.0 02/12/2010   Lab Results  Component Value Date   CHOLHDL 3 02/12/2010   Lab Results  Component Value Date   HGBA1C 5.6 12/11/2015   IMPRESSION AND PLAN:  1) Chronic pain syndrome: fibromyalgia. I printed rx for vicodin 10/325, 1-1.5 tabs q6h prn, #75 today.   With the additional pain in back and hip due to lumbar DDD with sciatica, we discussed other pain mgmt options today.  I am willing to  continue with pain mgmt with current med at current dose---no change in vicodin and no increase in prednisone.  If her injection in back next week is helpful, she anticipates continuing with me for this.  If her injection does NOT help, I said her best option would be a pain mgmt clinic.  She reluctantly agreed to proceed with referral to pain mgmt MD if injection not helpful.    2) White coat HTN: continue home bp monitoring, call if persistently >140s/90s.    3)  Hypothyroidism: TSH monitoring done today.  4) Adult ADHD: we'll continue with vyvanse 40 mg qd as this does help her adequately with focus/concentration/motivation/energy.  I printed rx's for vyvanse 40mg  qd, #30 today for this month, Feb 2018, and Mar 2018.  Appropriate fill on/after date was noted on each rx.  5) Preventative health care: rx for Tdap vaccine written for pt today.  An After Visit Summary was printed and given to the patient.  FOLLOW UP: Return in about 3 months (around 09/08/2016) for routine chronic illness f/u.  Signed:  Santiago Bumpers, MD           06/13/2016

## 2016-06-10 NOTE — Progress Notes (Signed)
Pre visit review using our clinic review tool, if applicable. No additional management support is needed unless otherwise documented below in the visit note. 

## 2016-06-11 LAB — BASIC METABOLIC PANEL
BUN: 22 mg/dL (ref 6–23)
CALCIUM: 9.4 mg/dL (ref 8.4–10.5)
CO2: 28 meq/L (ref 19–32)
Chloride: 105 mEq/L (ref 96–112)
Creatinine, Ser: 0.61 mg/dL (ref 0.40–1.20)
GFR: 101.25 mL/min (ref >60.00)
Glucose, Bld: 109 mg/dL — ABNORMAL HIGH (ref 70–99)
Potassium: 4.4 mEq/L (ref 3.5–5.1)
SODIUM: 142 meq/L (ref 135–145)

## 2016-06-11 LAB — TSH: TSH: 0.1 u[IU]/mL — AB (ref 0.35–4.50)

## 2016-06-15 ENCOUNTER — Encounter: Payer: Self-pay | Admitting: Family Medicine

## 2016-06-15 ENCOUNTER — Other Ambulatory Visit: Payer: Self-pay | Admitting: Family Medicine

## 2016-06-15 MED ORDER — LEVOTHYROXINE SODIUM 100 MCG PO TABS: 100.0000 ug | ORAL_TABLET | Freq: Every day | ORAL | 1 refills | Status: DC

## 2016-06-16 ENCOUNTER — Other Ambulatory Visit: Payer: Self-pay | Admitting: Family Medicine

## 2016-06-16 DIAGNOSIS — E039 Hypothyroidism, unspecified: Secondary | ICD-10-CM

## 2016-07-15 ENCOUNTER — Telehealth: Payer: Self-pay | Admitting: Family Medicine

## 2016-07-15 MED ORDER — HYDROCODONE-ACETAMINOPHEN 10-325 MG PO TABS: ORAL_TABLET | ORAL | 0 refills | Status: DC

## 2016-07-15 NOTE — Telephone Encounter (Signed)
Vicodin rx printed. 

## 2016-07-15 NOTE — Telephone Encounter (Signed)
Patient's husband "Casimiro NeedleMichael" is calling because his wife's referral to pain management can take up to a month and she is running out her Hydrocodone prescription.   They are requesting a refill to cover her between now and the time it will take for her to actually get into the pain management clinic.  Thank you,  -LL

## 2016-07-16 ENCOUNTER — Encounter: Payer: Self-pay | Admitting: Family Medicine

## 2016-07-16 NOTE — Telephone Encounter (Signed)
Patients husband notified and will pick up prescription per Harmon PierAmanda Fulton.

## 2016-07-30 ENCOUNTER — Encounter: Payer: Self-pay | Admitting: Physical Medicine & Rehabilitation

## 2016-08-20 ENCOUNTER — Other Ambulatory Visit: Payer: Self-pay | Admitting: Family Medicine

## 2016-08-20 MED ORDER — PREDNISONE 5 MG PO TABS: 5.0000 mg | ORAL_TABLET | Freq: Every day | ORAL | 6 refills | Status: DC

## 2016-08-20 MED ORDER — LEVOTHYROXINE SODIUM 100 MCG PO TABS: 100.0000 ug | ORAL_TABLET | Freq: Every day | ORAL | 1 refills | Status: DC

## 2016-08-20 MED ORDER — HYDROCODONE-ACETAMINOPHEN 10-325 MG PO TABS: ORAL_TABLET | ORAL | 0 refills | Status: DC

## 2016-08-20 NOTE — Telephone Encounter (Signed)
Will do rx's, but remind patient that I wanted her to return for lab visit to check thyroid lab (b/c we changed her dose last time we checked it b/c thyroid was too high).  Order is already in.-thx

## 2016-08-20 NOTE — Telephone Encounter (Signed)
Patient called to request prednisone and hydrocodone.   She states that she has appt w/ pain specialist 08/30/16 but she will run out of hydrocodone prior to that appt.   Last RX for hydrocodone 07/15/16.  Last OV 06/10/16 Next OV 09/08/16  Please advise.

## 2016-08-20 NOTE — Telephone Encounter (Signed)
Patient aware of all information.  Pt scheduled lab visit 08/23/16 @ 2pm.

## 2016-08-23 ENCOUNTER — Other Ambulatory Visit (INDEPENDENT_AMBULATORY_CARE_PROVIDER_SITE_OTHER): Payer: Medicare Other

## 2016-08-23 DIAGNOSIS — E039 Hypothyroidism, unspecified: Secondary | ICD-10-CM | POA: Diagnosis not present

## 2016-08-23 LAB — TSH: TSH: 0.1 u[IU]/mL — ABNORMAL LOW (ref 0.35–4.50)

## 2016-08-24 ENCOUNTER — Other Ambulatory Visit: Payer: Self-pay

## 2016-08-24 MED ORDER — LEVOTHYROXINE SODIUM 88 MCG PO TABS: 88.0000 ug | ORAL_TABLET | Freq: Every day | ORAL | 0 refills | Status: DC

## 2016-08-24 NOTE — Telephone Encounter (Signed)
Levothyroxine 88 mcg sent to pharmacy per request.

## 2016-08-25 ENCOUNTER — Encounter: Payer: Self-pay | Admitting: Family Medicine

## 2016-08-30 ENCOUNTER — Encounter: Payer: Self-pay | Admitting: Physical Medicine & Rehabilitation

## 2016-08-30 ENCOUNTER — Encounter: Payer: Medicare Other | Attending: Physical Medicine & Rehabilitation

## 2016-08-30 ENCOUNTER — Ambulatory Visit (HOSPITAL_BASED_OUTPATIENT_CLINIC_OR_DEPARTMENT_OTHER): Payer: Medicare Other | Admitting: Physical Medicine & Rehabilitation

## 2016-08-30 VITALS — BP 162/96 | HR 100 | Resp 14

## 2016-08-30 DIAGNOSIS — J449 Chronic obstructive pulmonary disease, unspecified: Secondary | ICD-10-CM | POA: Insufficient documentation

## 2016-08-30 DIAGNOSIS — Z5181 Encounter for therapeutic drug level monitoring: Secondary | ICD-10-CM | POA: Diagnosis not present

## 2016-08-30 DIAGNOSIS — G4700 Insomnia, unspecified: Secondary | ICD-10-CM | POA: Diagnosis not present

## 2016-08-30 DIAGNOSIS — M797 Fibromyalgia: Secondary | ICD-10-CM | POA: Insufficient documentation

## 2016-08-30 DIAGNOSIS — M1990 Unspecified osteoarthritis, unspecified site: Secondary | ICD-10-CM | POA: Diagnosis not present

## 2016-08-30 DIAGNOSIS — K219 Gastro-esophageal reflux disease without esophagitis: Secondary | ICD-10-CM | POA: Insufficient documentation

## 2016-08-30 DIAGNOSIS — M810 Age-related osteoporosis without current pathological fracture: Secondary | ICD-10-CM | POA: Insufficient documentation

## 2016-08-30 DIAGNOSIS — E039 Hypothyroidism, unspecified: Secondary | ICD-10-CM | POA: Insufficient documentation

## 2016-08-30 DIAGNOSIS — M48061 Spinal stenosis, lumbar region without neurogenic claudication: Secondary | ICD-10-CM | POA: Diagnosis present

## 2016-08-30 DIAGNOSIS — G894 Chronic pain syndrome: Secondary | ICD-10-CM

## 2016-08-30 DIAGNOSIS — Z79899 Other long term (current) drug therapy: Secondary | ICD-10-CM

## 2016-08-30 DIAGNOSIS — M4316 Spondylolisthesis, lumbar region: Secondary | ICD-10-CM | POA: Diagnosis not present

## 2016-08-30 MED ORDER — TRAMADOL HCL 50 MG PO TABS: 50.0000 mg | ORAL_TABLET | Freq: Two times a day (BID) | ORAL | 0 refills | Status: DC

## 2016-08-30 NOTE — Progress Notes (Signed)
Subjective:    Patient ID: [name], female    DOB: 07/11/1940, 76 y.o.   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  Also L3 on L4 anterolisthesis with moderate spinal stenosis, moderate to severe left foraminal and moderate right foraminal stenosis impinging on L4 nerve roots  MRI report from [facility] dated 08/25/2009 demonstrating full thickness supraspinatus and infraspinatus tear, right side. MRI report 11/05/2010. Thoracic spine. Right paracentral disc extrusion. No significant central stenosis. At T6-7, right paracentral disc extrusion at T8-9 with mild to moderate left and mild right foraminal stenosis. Central canal patent. Fluid signal intensity T8-9, 2-3 mm, T10-11, hypertrophic facet arthrosis  Reviewed patient notes regarding injections performed by Dr. Ethelene Hal, these were from 2004-2006. Cervical medial branch injections 2006  Dr. Ethelene Hal also performing lumbar injections L4-5 ESI, left side. Patient estimates that they were helpful for about 6 weeks  Tried Lyrica which caused double vision  Pain Inventory Average Pain 7 Pain Right Now 7 My pain is intermittent, sharp, dull, stabbing and aching  In the last 24 hours, has pain interfered with the following? General activity 5 Relation with others 5 Enjoyment of life 5 What TIME of day is your pain at its worst? daytime, evening  Sleep (in general) Fair  Pain is worse with: walking, bending, standing and some activites Pain improves with: rest, heat/ice, medication and injections Relief from Meds: 6  Mobility walk without assistance ability to climb steps?  yes do you drive?  yes Do you have any goals in this area?  no  Function not  employed: date last employed . Do you have any goals in this area?  no  Neuro/Psych bladder control problems  Prior Studies bone scan x-rays CT/MRI nerve study new visit  Physicians involved in your care new visit   Family History  Problem Relation Age of Onset  . Dementia Mother   . Lung cancer Father   . Irritable bowel syndrome    . Colon cancer Neg Hx   . Breast cancer Neg Hx    Social History   Social History  . Marital status: Married    Spouse name: N/A  . Number of children: N/A  . Years of education: N/A   Occupational History  . retired Retired   Social History Main Topics  . Smoking status: Never Smoker  . Smokeless tobacco: Never Used  . Alcohol use No  . Drug use: No  . Sexual activity: Yes   Other Topics Concern  . None   Social History Narrative   Married 53  Years, has 2 sons.   Orig from [place].   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   No T/A/Ds.   Tries to eat carb-conscious diet.         Past Surgical History:  Procedure Laterality Date  . BLADDER SUSPENSION  1983  . Bone density  01/2009; 12/2010   T -2.8 to -3.0  . CHOLECYSTECTOMY  2009  . COLONOSCOPY  01/27/2004   diverticulosis  . ESOPHAGOGASTRODUODENOSCOPY ENDOSCOPY  1983; 2005   GERD, distal esoph stricture, no Barrett's esoph  . RECTOCELE REPAIR    . ROTATOR CUFF REPAIR  2011  Right  . TOTAL KNEE ARTHROPLASTY  2008/2011   both knees  . VAGINAL HYSTERECTOMY  1983   Ovaries still in.  Nonmalignant reason.   Past Medical History:  Diagnosis Date  . Allergic rhinitis due to pollen 11/30/2006   Skin test 10/15/09. Retest 10/30/10 Allergy vaccine d/c'd 09/2012 Significant component of irritant/ vasomotor rhinitis    . Chronic fatigue   . 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 Degenerative disk disease    (02/2011 CT scans).  Cervical (MRI 2001 & 2003).  Hx of thoracic epidural injections via  Dr. Noel Gerold.  . Elevated blood pressure reading without diagnosis of hypertension    ? White coat HTN?   All home bp's normal.  . Fibromyalgia   . GERD (gastroesophageal reflux disease)   . Hypothyroidism   . Insomnia    Ambien occasionally  . Migraine syndrome   . Osteoarthritis   . Osteoporosis    Bisphosphonates no help.  Prolia caused side effects.  . Sacroiliac joint dysfunction   . Scoliosis deformity of spine   . Urinary incontinence    BP (!) 162/96   Pulse 100   Resp 14   SpO2 94%   Opioid Risk Score:   Fall Risk Score:  `1  Depression screen PHQ 2/9  Depression screen [facility] 2/9 09/08/2015 09/08/2015 07/27/2013 11/24/2012  Decreased Interest 0 0 0 1  Down, Depressed, Hopeless 0 0 0 0  PHQ - 2 Score 0 0 0 1    Review of Systems  Constitutional: Negative.   HENT: Negative.   Eyes: Negative.   Respiratory: Negative.   Cardiovascular: Positive for leg swelling.  Gastrointestinal: Negative.   Endocrine: Negative.   Genitourinary: Positive for difficulty urinating.  Musculoskeletal: Positive for arthralgias, back pain, gait problem, myalgias and neck pain.  Allergic/Immunologic: Negative.   Hematological: Bruises/bleeds easily.  Psychiatric/Behavioral: Negative.        Objective:   Physical Exam  Constitutional: She is oriented to person, place, and time. She appears well-developed and well-nourished.  HENT:  Head: Normocephalic and atraumatic.  Eyes: Conjunctivae and EOM are normal. Pupils are equal, round, and reactive to light.  Neck: Normal range of motion.  Cardiovascular: Normal rate, regular rhythm and normal heart sounds.   Pulmonary/Chest: Effort normal and breath sounds normal.  Abdominal: Soft. Bowel sounds are normal.  Musculoskeletal:       Arms: Neurological: She is alert and oriented to person, place, and time.  Psychiatric: She has a normal mood and affect.  Nursing note and vitals reviewed.  FROM Lumbar flex/ext lat rotation and  bending Negative straight leg raising 5/5 strength, bilateral deltoid by suppressive grip, hip flexion, knee extension in closed flexor. Sensation intact pinprick, bilateral C5, C6, C7, C8 distribution, bilateral L3, L4, reduced left L5-S1 versus right side. Deep tendon reflexes absent bilateral ankles, 1 plus bilateral knees Ambulates without assistive device. No evidence of toe drag or knee instability. Hip, knee, ankle range of motion is full       Assessment & Plan:  1. Lumbar spondylolisthesis and lumbar spine, spinal stenosis, primarily affecting L4-5 greater than L3-4 levels. She does have some evidence of lower extremity radiculopathy. She has good range of motion, good lower extremity strength. At this time, I do not think she would need any physical therapy. She gets good relief from minimal doses of hydrocodone, i.e., 5 mg per day. She has greater than 6 weeks' supply left.  We discussed that given her relatively low doses of hydrocodone. She might be able to get relief from tramadol or Tylenol with codeine. I have written a prescription for tramadol 50 mg 1-2 tablets per day, she will trial this for a week and if this is helpful, we may replace the hydrocodone prescription with a tramadol prescription. If not, we can trial Tylenol with Codeine instead  We also discussed that her L4-5 injection can be performed at this office. She is not due for one for at least another 1-2 months  Urine drug screen performed today in the event that we prescribe schedule 2 or schedule 3  Narcotic analgesics

## 2016-09-01 ENCOUNTER — Encounter: Payer: Self-pay | Admitting: Family Medicine

## 2016-09-05 LAB — TOXASSURE SELECT,+ANTIDEPR,UR

## 2016-09-06 ENCOUNTER — Other Ambulatory Visit: Payer: Self-pay | Admitting: Physical Medicine & Rehabilitation

## 2016-09-07 ENCOUNTER — Telehealth: Payer: Self-pay | Admitting: *Deleted

## 2016-09-07 NOTE — Telephone Encounter (Signed)
May call in Tramadol  1-2 tablet per day #60 2RF if pt gets good relief during trial

## 2016-09-07 NOTE — Telephone Encounter (Signed)
Urine drug screen for this encounter is consistent for prescribed medication. She did not have a current rx for tramadol at the time of the UDS.

## 2016-09-07 NOTE — Telephone Encounter (Signed)
Called in to patients pharmacy and patient notified

## 2016-09-07 NOTE — Telephone Encounter (Signed)
[name] is wanting a refill on her tramadol script.  We received it electronically and she left a telephone message. She was seen in clinic 08/30/2016.  However, the script written was for a one week trial,  #15.   Her UDS has come back consistent.   I placed a phone call to [name] and left a message asking if the tramadol was working sufficiently to control her pain. I am awaiting a call back for confirmation.

## 2016-09-08 ENCOUNTER — Ambulatory Visit (INDEPENDENT_AMBULATORY_CARE_PROVIDER_SITE_OTHER): Payer: Medicare Other | Admitting: Family Medicine

## 2016-09-08 ENCOUNTER — Encounter: Payer: Self-pay | Admitting: Family Medicine

## 2016-09-08 VITALS — BP 152/90 | HR 92 | Temp 98.3°F | Resp 16 | Ht 60.75 in | Wt 147.8 lb

## 2016-09-08 DIAGNOSIS — R030 Elevated blood-pressure reading, without diagnosis of hypertension: Secondary | ICD-10-CM | POA: Diagnosis not present

## 2016-09-08 DIAGNOSIS — R5382 Chronic fatigue, unspecified: Secondary | ICD-10-CM | POA: Diagnosis not present

## 2016-09-08 DIAGNOSIS — M797 Fibromyalgia: Secondary | ICD-10-CM

## 2016-09-08 DIAGNOSIS — E039 Hypothyroidism, unspecified: Secondary | ICD-10-CM

## 2016-09-08 DIAGNOSIS — F988 Other specified behavioral and emotional disorders with onset usually occurring in childhood and adolescence: Secondary | ICD-10-CM

## 2016-09-08 MED ORDER — CYCLOBENZAPRINE HCL 10 MG PO TABS: ORAL_TABLET | ORAL | 1 refills | Status: DC

## 2016-09-08 NOTE — Progress Notes (Signed)
OFFICE VISIT  09/08/2016   CC:  Chief Complaint  Patient presents with  . Follow-up    RCI, pt is not fasting.   HPI:    Patient is a 76 y.o. Caucasian female who presents for 3 mo f/u fibromyalgia syndrome, hypothyroidism, white coat HTN, chronic fatigue, and adult ADD. She recently started seeing Dr. Wynn Banker at [facility] med and rehab and he started her on tramadol in place of vicodin for her back pain/DDD.  She did f/u with him yesterday and got another rx for tramadol.  No plan for any interventional procedures for her pain per pt report today.  From here on out, Dr. Wynn Banker will be managing her pain meds.  Hypoth: recently decreased her dose to 88 mcg qd due to TSH over-suppressed.  White coat HTN: home bp's always 130/80 or less.  Still taking vyvanse, doesn't sound like it's helping all that much with focus/concentration/motivation/affect/energy, but she wants to continue taking it.  She woke up with migraine today and has not tried her imitrex yet.  Fibromyalgia status: "it's hard to say, Doctor, b/c there are so many things going on in my body". "I'm just a mess of pain".  Neck hurts, some R upper arm pain, left hip hurts, lower legs/ankles/feet all hurt. No shoulder pains or thigh pains.  Has chronic LBP.  Says LL's swell up and get achy.   Past Medical History:  Diagnosis Date  . Allergic rhinitis due to pollen 11/30/2006   Skin test 10/15/09. Retest 10/30/10 Allergy vaccine d/c'd 09/2012 Significant component of irritant/ vasomotor rhinitis    . Chronic fatigue   . 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 Degenerative disk disease    (02/2011 CT scans).  Cervical (MRI 2001 & 2003).  Hx of thoracic epidural injections via Dr. Noel Gerold.  +Lumbar.  . Elevated blood pressure reading without diagnosis of hypertension    ? White coat HTN?   All home bp's normal.  . Fibromyalgia   . GERD (gastroesophageal reflux disease)   .  Hypothyroidism   . Insomnia    Ambien occasionally  . Migraine syndrome   . Osteoarthritis   . Osteoporosis    Bisphosphonates no help.  Prolia caused side effects.  . Sacroiliac joint dysfunction   . Scoliosis deformity of spine   . Spinal stenosis of lumbar region with radiculopathy    + spondylolisthesis.  Dr. Wynn Banker with physical med/rehab did eval 08/2016 and did trial of tramadol instead of hydrocodone--he will likely be the MD to prescribe her pain meds in the future.  . Urinary incontinence     Past Surgical History:  Procedure Laterality Date  . BLADDER SUSPENSION  1983  . Bone density  01/2009; 12/2010   T -2.8 to -3.0  . CHOLECYSTECTOMY  2009  . COLONOSCOPY  01/27/2004   diverticulosis  . ESOPHAGOGASTRODUODENOSCOPY ENDOSCOPY  1983; 2005   GERD, distal esoph stricture, no Barrett's esoph  . RECTOCELE REPAIR    . ROTATOR CUFF REPAIR  2011   Right  . TOTAL KNEE ARTHROPLASTY  2008/2011   both knees  . VAGINAL HYSTERECTOMY  1983   Ovaries still in.  Nonmalignant reason.    Outpatient Medications Prior to Visit  Medication Sig Dispense Refill  . levothyroxine (SYNTHROID, LEVOTHROID) 88 MCG tablet Take 1 tablet (88 mcg total) by mouth daily. 90 tablet 0  . lisdexamfetamine (VYVANSE) 40 MG capsule TAKE 1 CAPSULE BY MOUTH IN THE  MORNING 30 capsule 0  . Multiple Vitamins-Minerals (OCUVITE ADULT FORMULA) CAPS Take 1 capsule by mouth daily.     . multivitamin (THERAGRAN) per tablet Take 1 tablet by mouth daily.     . predniSONE (DELTASONE) 5 MG tablet Take 1 tablet (5 mg total) by mouth daily. 30 tablet 6  . SUMAtriptan (IMITREX) 100 MG tablet TAKE 1 TABLET BY MOUTH FOR MIGRAINES 9 tablet 12  . traMADol (ULTRAM) 50 MG tablet 1-2 tablet per day as needed for pain 60 tablet 2  . HYDROcodone-acetaminophen (NORCO) 10-325 MG tablet 1 or 1.5 every 6 hours prn pain (Patient not taking: Reported on 09/08/2016) 90 tablet 0   No facility-administered medications prior to visit.      Allergies  Allergen Reactions  . Terbinafine Hcl Hives  . Thimerosal Swelling    Redness    ROS As per HPI  PE: Blood pressure (!) 152/90, pulse 92, temperature 98.3 F (36.8 C), temperature source Oral, resp. rate 16, height 5' 0.75" (1.543 m), weight 147 lb 12 oz (67 kg), SpO2 95 %. Gen: Alert, well appearing.  Patient is oriented to person, place, time, and situation. AFFECT: pleasant, lucid thought and speech. CV: RRR, no m/r/g.   LUNGS: CTA bilat, nonlabored resps, good aeration in all lung fields. EXT: no clubbing, cyanosis, or edema.  Mild tenderness to palpation over both lower legs, ankles, feet diffusely.  Otherwise no soft tissue tenderness to palpation today.  LABS:  Lab Results  Component Value Date   TSH 0.10 (L) 08/23/2016     Chemistry      Component Value Date/Time   NA 142 06/10/2016 1530   K 4.4 06/10/2016 1530   CL 105 06/10/2016 1530   CO2 28 06/10/2016 1530   BUN 22 06/10/2016 1530   CREATININE 0.61 06/10/2016 1530      Component Value Date/Time   CALCIUM 9.4 06/10/2016 1530   ALKPHOS 53 03/05/2013 1603   AST 22 03/05/2013 1603   ALT 17 03/05/2013 1603   BILITOT 0.6 03/05/2013 1603     Lab Results  Component Value Date   WBC 6.4 12/11/2015   HGB 13.7 12/11/2015   HCT 41.2 12/11/2015   MCV 92.3 12/11/2015   PLT 188.0 12/11/2015    IMPRESSION AND PLAN:  1) Fibromyalgia: will do trial of flexeril  bid prn, #60, RF x 1. Needs to increase exercise.  2) Chronic pain; DDD with lumbar spinal stenosis: pain mgmt as per Dr. Wynn Banker in physical med/rehab.  3) Adult ADD: doing ok on vyvanse--no new rx needed for this med today.  4) Chronic fatigue: unchanged.  Don't really know what to offer her here--needs to get more active and perhaps this will breed more energy and desire to increase activity.  5) White coat HTN: continue periodic home monitoring.  6) Hypothyroidism: recent down-titration of levothyroxine due to low TSH (now  on 88 mcg qd). Will have her return in 6 weeks for o/v and recheck TSH at that time.  An After Visit Summary was printed and given to the patient.  FOLLOW UP: Return in about 6 weeks (around 10/20/2016) for f/u fibro and hypoth (needs TSH at this time).  Signed:  Santiago Bumpers, MD           09/08/2016

## 2016-09-08 NOTE — Progress Notes (Signed)
Pre visit review using our clinic review tool, if applicable. No additional management support is needed unless otherwise documented below in the visit note. 

## 2016-09-20 ENCOUNTER — Other Ambulatory Visit: Payer: Self-pay | Admitting: Family Medicine

## 2016-09-20 NOTE — Telephone Encounter (Signed)
[facility].  RF request for vyvanse LOV: 09/08/16 Next ov: 10/20/16 Last written: 06/10/16 #30 w/ 0RF  Please advise. Thanks.

## 2016-09-20 NOTE — Telephone Encounter (Signed)
Rx put up front for p/u. Pts son advised and voiced understanding, okay per DPR.

## 2016-10-08 ENCOUNTER — Ambulatory Visit (HOSPITAL_BASED_OUTPATIENT_CLINIC_OR_DEPARTMENT_OTHER): Payer: Medicare Other | Admitting: Physical Medicine & Rehabilitation

## 2016-10-08 ENCOUNTER — Encounter: Payer: Medicare Other | Attending: Physical Medicine & Rehabilitation

## 2016-10-08 ENCOUNTER — Encounter: Payer: Self-pay | Admitting: Physical Medicine & Rehabilitation

## 2016-10-08 VITALS — BP 98/72 | HR 85

## 2016-10-08 DIAGNOSIS — J449 Chronic obstructive pulmonary disease, unspecified: Secondary | ICD-10-CM | POA: Diagnosis not present

## 2016-10-08 DIAGNOSIS — M1990 Unspecified osteoarthritis, unspecified site: Secondary | ICD-10-CM | POA: Diagnosis not present

## 2016-10-08 DIAGNOSIS — M810 Age-related osteoporosis without current pathological fracture: Secondary | ICD-10-CM | POA: Diagnosis not present

## 2016-10-08 DIAGNOSIS — M797 Fibromyalgia: Secondary | ICD-10-CM | POA: Insufficient documentation

## 2016-10-08 DIAGNOSIS — K219 Gastro-esophageal reflux disease without esophagitis: Secondary | ICD-10-CM | POA: Diagnosis not present

## 2016-10-08 DIAGNOSIS — M48061 Spinal stenosis, lumbar region without neurogenic claudication: Secondary | ICD-10-CM | POA: Insufficient documentation

## 2016-10-08 DIAGNOSIS — G4700 Insomnia, unspecified: Secondary | ICD-10-CM | POA: Diagnosis not present

## 2016-10-08 DIAGNOSIS — E039 Hypothyroidism, unspecified: Secondary | ICD-10-CM | POA: Insufficient documentation

## 2016-10-08 DIAGNOSIS — M4316 Spondylolisthesis, lumbar region: Secondary | ICD-10-CM | POA: Diagnosis not present

## 2016-10-08 MED ORDER — TRAMADOL HCL 50 MG PO TABS: 50.0000 mg | ORAL_TABLET | Freq: Four times a day (QID) | ORAL | 2 refills | Status: DC | PRN

## 2016-10-08 NOTE — Progress Notes (Signed)
Subjective:    Patient ID: [name], female    DOB: 07-15-1940, 76 y.o.   MRN: [id number]  HPI   Chief complaint is low back pain. This exacerbated by yard work such as picking weeds. She has remained functionally independent with all her self-care and mobility. Does not use an assistive device. Patient tried tramadol in place of hydrocodone. This has been adequate for her pain relief. Generally, 2 tablets per day. She had several times with doing extra activity that she took an extra tramadol. UDS appropriate Tried acupuncture and massage which was not helpful.   Pain Inventory Average Pain 6 Pain Right Now 6 My pain is stabbing and aching  In the last 24 hours, has pain interfered with the following? General activity 6 Relation with others 6 Enjoyment of life 6 What TIME of day is your pain at its worst? daytime Sleep (in general) Fair  Pain is worse with: walking and standing Pain improves with: rest and medication Relief from Meds: 6  Mobility walk without assistance ability to climb steps?  yes do you drive?  yes  Function Do you have any goals in this area?  no  Neuro/Psych No problems in this area  Prior Studies Any changes since last visit?  no  Physicians involved in your care Any changes since last visit?  no   Family History  Problem Relation Age of Onset  . Dementia Mother   . Lung cancer Father   . Irritable bowel syndrome Unknown   . Colon cancer Neg Hx   . Breast cancer Neg Hx    Social History   Social History  . Marital status: Married    Spouse name: N/A  . Number of children: N/A  . Years of education: N/A   Occupational History  . retired Retired   Social History Main Topics  . Smoking status: Never Smoker  . Smokeless tobacco: Never Used  . Alcohol use No  . Drug use: No  . Sexual activity: Yes   Other Topics Concern  . None   Social History Narrative   Married 53  Years, has 2 sons.   Orig from [place].   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   No T/A/Ds.   Tries to eat carb-conscious diet.         Past Surgical History:  Procedure Laterality Date  . BLADDER SUSPENSION  1983  . Bone density  01/2009; 12/2010   T -2.8 to -3.0  . CHOLECYSTECTOMY  2009  . COLONOSCOPY  01/27/2004   diverticulosis  . ESOPHAGOGASTRODUODENOSCOPY ENDOSCOPY  1983; 2005   GERD, distal esoph stricture, no Barrett's esoph  . RECTOCELE REPAIR    . ROTATOR CUFF REPAIR  2011   Right  . TOTAL KNEE ARTHROPLASTY  2008/2011   both knees  . VAGINAL HYSTERECTOMY  1983   Ovaries still in.  Nonmalignant reason.   Past Medical History:  Diagnosis Date  . Allergic rhinitis due to pollen 11/30/2006   Skin test 10/15/09. Retest 10/30/10 Allergy vaccine d/c'd 09/2012 Significant component of irritant/ vasomotor rhinitis    . Chronic fatigue   . 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 Degenerative disk disease    (02/2011 CT scans).  Cervical (MRI 2001 & 2003).  Hx of thoracic epidural injections via Dr. Noel Gerold.  +Lumbar.  . Elevated blood pressure reading without diagnosis of hypertension    ?  White coat HTN?   All home bp's normal.  . Fibromyalgia   . GERD (gastroesophageal reflux disease)   . Hypothyroidism   . Insomnia    Ambien occasionally  . Migraine syndrome   . Osteoarthritis   . Osteoporosis    Bisphosphonates no help.  Prolia caused side effects.  . Sacroiliac joint dysfunction   . Scoliosis deformity of spine   . Spinal stenosis of lumbar region with radiculopathy    + spondylolisthesis.  Dr. Wynn BankerKirsteins with physical med/rehab did eval 08/2016 and did trial of tramadol instead of hydrocodone--he will likely be the MD to prescribe her pain meds in the future.  . Urinary incontinence    BP 98/72   Pulse 85   SpO2 95%   Opioid Risk Score:   Fall Risk Score:  `1  Depression screen PHQ 2/9  [facility] 08/30/2016  09/08/2015 09/08/2015 07/27/2013 11/24/2012  Decreased Interest 0 0 0 0 1  Down, Depressed, Hopeless 0 0 0 0 0  PHQ - 2 Score 0 0 0 0 1  Altered sleeping 0 - - - -  Tired, decreased energy 0 - - - -  Change in appetite 1 - - - -  Feeling bad or failure about yourself  0 - - - -  Trouble concentrating 0 - - - -  Moving slowly or fidgety/restless 0 - - - -  Suicidal thoughts 0 - - - -  PHQ-9 Score 1 - - - -    Review of Systems  Constitutional: Negative.   HENT: Negative.   Eyes: Negative.   Respiratory: Negative.   Cardiovascular: Negative.   Gastrointestinal: Negative.   Endocrine: Negative.   Genitourinary: Negative.   Musculoskeletal: Positive for joint swelling.  Skin: Negative.   Allergic/Immunologic: Negative.   Neurological: Negative.   Hematological: Negative.   Psychiatric/Behavioral: Negative.   All other systems reviewed and are negative.      Objective:   Physical Exam  Constitutional: She is oriented to person, place, and time. She appears well-developed and well-nourished.  HENT:  Head: Normocephalic and atraumatic.  Eyes: Conjunctivae and EOM are normal. Pupils are equal, round, and reactive to light.  Neck: Normal range of motion.  Musculoskeletal:       Cervical back: She exhibits decreased range of motion and tenderness. She exhibits no spasm.       Thoracic back: She exhibits decreased range of motion and deformity. She exhibits no spasm.       Lumbar back: She exhibits decreased range of motion and deformity. She exhibits no bony tenderness and no spasm.  Pelvic obliquity, right iliac crest riding higher than left side.  Left lateral cervical area tenderness along the lateral masses   Neurological: She is alert and oriented to person, place, and time. No sensory deficit. Gait abnormal.  Forward flexed posture. Negative toe drag or knee instability.   Psychiatric: She has a normal mood and affect. Her behavior is normal. Judgment and thought content  normal.  Nursing note and vitals reviewed.  Scoliosis centered at L3, levoconvex above L3 dextroconvex below L3 No tenderness along lumbar paraspinals.  Floating ribs upon left iliac crest Patient with decreased lateral bending. Forward flexion is at least 75%, extension is 25% of normal range. Negative straight leg raising. Normal strength bilateral upper and lower limbs with the exception of the right deltoid which is limited by range of motion and is graded at 3 minus.   Right shoulder with limited  range of motion, abduction to 90.    Assessment & Plan:  1. Lumbar scoliosis with functional leg length discrepancy, recommend heel lift on left.  2. Lumbar spinal stenosis. No evidence of neurogenic claudication.  Patient getting good pain relief with tramadol. We'll discontinue hydrocodone. Make take tramadol 2 tablets per day on occasion may need to take an extra one. Therefore, 65 tablets for a month Return to clinic 3 months  3. Cervicalgia had some type of injections but Dr. Ethelene Hal. Have asked for some clinic and injection notes. We'll review and decide whether we will do them here . Should her pain worsen  Over half of the 25 min visit was spent counseling and coordinating care.

## 2016-10-08 NOTE — Patient Instructions (Signed)
Please send the injection records from Dr. Ethelene Halamos office

## 2016-10-18 ENCOUNTER — Ambulatory Visit (INDEPENDENT_AMBULATORY_CARE_PROVIDER_SITE_OTHER): Payer: Medicare Other

## 2016-10-18 ENCOUNTER — Other Ambulatory Visit (INDEPENDENT_AMBULATORY_CARE_PROVIDER_SITE_OTHER): Payer: Medicare Other

## 2016-10-18 VITALS — BP 150/78 | HR 54 | Ht 61.0 in | Wt 148.1 lb

## 2016-10-18 DIAGNOSIS — E039 Hypothyroidism, unspecified: Secondary | ICD-10-CM | POA: Diagnosis not present

## 2016-10-18 DIAGNOSIS — Z0000 Encounter for general adult medical examination without abnormal findings: Secondary | ICD-10-CM | POA: Diagnosis not present

## 2016-10-18 NOTE — Patient Instructions (Addendum)
Bring a copy of your advance directives to your next office visit.  Continue doing brain stimulating activities (puzzles, reading, adult coloring books, staying active) to keep memory sharp.   Call when ready to schedule mammogram and bone scan.  Fall Prevention in the Home Falls can cause injuries. They can happen to people of all ages. There are many things you can do to make your home safe and to help prevent falls. What can I do on the outside of my home?  Regularly fix the edges of walkways and driveways and fix any cracks.  Remove anything that might make you trip as you walk through a door, such as a raised step or threshold.  Trim any bushes or trees on the path to your home.  Use bright outdoor lighting.  Clear any walking paths of anything that might make someone trip, such as rocks or tools.  Regularly check to see if handrails are loose or broken. Make sure that both sides of any steps have handrails.  Any raised decks and porches should have guardrails on the edges.  Have any leaves, snow, or ice cleared regularly.  Use sand or salt on walking paths during winter.  Clean up any spills in your garage right away. This includes oil or grease spills. What can I do in the bathroom?  Use night lights.  Install grab bars by the toilet and in the tub and shower. Do not use towel bars as grab bars.  Use non-skid mats or decals in the tub or shower.  If you need to sit down in the shower, use a plastic, non-slip stool.  Keep the floor dry. Clean up any water that spills on the floor as soon as it happens.  Remove soap buildup in the tub or shower regularly.  Attach bath mats securely with double-sided non-slip rug tape.  Do not have throw rugs and other things on the floor that can make you trip. What can I do in the bedroom?  Use night lights.  Make sure that you have a light by your bed that is easy to reach.  Do not use any sheets or blankets that are too big  for your bed. They should not hang down onto the floor.  Have a firm chair that has side arms. You can use this for support while you get dressed.  Do not have throw rugs and other things on the floor that can make you trip. What can I do in the kitchen?  Clean up any spills right away.  Avoid walking on wet floors.  Keep items that you use a lot in easy-to-reach places.  If you need to reach something above you, use a strong step stool that has a grab bar.  Keep electrical cords out of the way.  Do not use floor polish or wax that makes floors slippery. If you must use wax, use non-skid floor wax.  Do not have throw rugs and other things on the floor that can make you trip. What can I do with my stairs?  Do not leave any items on the stairs.  Make sure that there are handrails on both sides of the stairs and use them. Fix handrails that are broken or loose. Make sure that handrails are as long as the stairways.  Check any carpeting to make sure that it is firmly attached to the stairs. Fix any carpet that is loose or worn.  Avoid having throw rugs at the top or bottom  of the stairs. If you do have throw rugs, attach them to the floor with carpet tape.  Make sure that you have a light switch at the top of the stairs and the bottom of the stairs. If you do not have them, ask someone to add them for you. What else can I do to help prevent falls?  Wear shoes that: ? Do not have high heels. ? Have rubber bottoms. ? Are comfortable and fit you well. ? Are closed at the toe. Do not wear sandals.  If you use a stepladder: ? Make sure that it is fully opened. Do not climb a closed stepladder. ? Make sure that both sides of the stepladder are locked into place. ? Ask someone to hold it for you, if possible.  Clearly mark and make sure that you can see: ? Any grab bars or handrails. ? First and last steps. ? Where the edge of each step is.  Use tools that help you move around  (mobility aids) if they are needed. These include: ? Canes. ? Walkers. ? Scooters. ? Crutches.  Turn on the lights when you go into a dark area. Replace any light bulbs as soon as they burn out.  Set up your furniture so you have a clear path. Avoid moving your furniture around.  If any of your floors are uneven, fix them.  If there are any pets around you, be aware of where they are.  Review your medicines with your doctor. Some medicines can make you feel dizzy. This can increase your chance of falling. Ask your doctor what other things that you can do to help prevent falls. This information is not intended to replace advice given to you by your health care provider. Make sure you discuss any questions you have with your health care provider. Document Released: 02/27/2009 Document Revised: 10/09/2015 Document Reviewed: 06/07/2014 Elsevier Interactive Patient Education  2018 [place] Maintenance, Female Adopting a healthy lifestyle and getting preventive care can go a long way to promote health and wellness. Talk with your health care provider about what schedule of regular examinations is right for you. This is a good chance for you to check in with your provider about disease prevention and staying healthy. In between checkups, there are plenty of things you can do on your own. Experts have done a lot of research about which lifestyle changes and preventive measures are most likely to keep you healthy. Ask your health care provider for more information. Weight and diet Eat a healthy diet  Be sure to include plenty of vegetables, fruits, low-fat dairy products, and lean protein.  Do not eat a lot of foods high in solid fats, added sugars, or salt.  Get regular exercise. This is one of the most important things you can do for your health. ? Most adults should exercise for at least 150 minutes each week. The exercise should increase your heart rate and make you sweat  (moderate-intensity exercise). ? Most adults should also do strengthening exercises at least twice a week. This is in addition to the moderate-intensity exercise.  Maintain a healthy weight  Body mass index (BMI) is a measurement that can be used to identify possible weight problems. It estimates body fat based on height and weight. Your health care provider can help determine your BMI and help you achieve or maintain a healthy weight.  For females 22 years of age and older: ? A BMI below 18.5 is considered underweight. ?  A BMI of 18.5 to 24.9 is normal. ? A BMI of 25 to 29.9 is considered overweight. ? A BMI of 30 and above is considered obese.  Watch levels of cholesterol and blood lipids  You should start having your blood tested for lipids and cholesterol at 77 years of age, then have this test every 5 years.  You may need to have your cholesterol levels checked more often if: ? Your lipid or cholesterol levels are high. ? You are older than 77 years of age. ? You are at high risk for heart disease.  Cancer screening Lung Cancer  Lung cancer screening is recommended for adults 76-9 years old who are at high risk for lung cancer because of a history of smoking.  A yearly low-dose CT scan of the lungs is recommended for people who: ? Currently smoke. ? Have quit within the past 15 years. ? Have at least a 30-pack-year history of smoking. A pack year is smoking an average of one pack of cigarettes a day for 1 year.  Yearly screening should continue until it has been 15 years since you quit.  Yearly screening should stop if you develop a health problem that would prevent you from having lung cancer treatment.  Breast Cancer  Practice breast self-awareness. This means understanding how your breasts normally appear and feel.  It also means doing regular breast self-exams. Let your health care provider know about any changes, no matter how small.  If you are in your 20s or  30s, you should have a clinical breast exam (CBE) by a health care provider every 1-3 years as part of a regular health exam.  If you are 4 or older, have a CBE every year. Also consider having a breast X-ray (mammogram) every year.  If you have a family history of breast cancer, talk to your health care provider about genetic screening.  If you are at high risk for breast cancer, talk to your health care provider about having an MRI and a mammogram every year.  Breast cancer gene (BRCA) assessment is recommended for women who have family members with BRCA-related cancers. BRCA-related cancers include: ? Breast. ? Ovarian. ? Tubal. ? Peritoneal cancers.  Results of the assessment will determine the need for genetic counseling and BRCA1 and BRCA2 testing.  Cervical Cancer Your health care provider may recommend that you be screened regularly for cancer of the pelvic organs (ovaries, uterus, and vagina). This screening involves a pelvic examination, including checking for microscopic changes to the surface of your cervix (Pap test). You may be encouraged to have this screening done every 3 years, beginning at age 83.  For women ages 69-65, health care providers may recommend pelvic exams and Pap testing every 3 years, or they may recommend the Pap and pelvic exam, combined with testing for human papilloma virus (HPV), every 5 years. Some types of HPV increase your risk of cervical cancer. Testing for HPV may also be done on women of any age with unclear Pap test results.  Other health care providers may not recommend any screening for nonpregnant women who are considered low risk for pelvic cancer and who do not have symptoms. Ask your health care provider if a screening pelvic exam is right for you.  If you have had past treatment for cervical cancer or a condition that could lead to cancer, you need Pap tests and screening for cancer for at least 20 years after your treatment. If Pap tests  have  been discontinued, your risk factors (such as having a new sexual partner) need to be reassessed to determine if screening should resume. Some women have medical problems that increase the chance of getting cervical cancer. In these cases, your health care provider may recommend more frequent screening and Pap tests.  Colorectal Cancer  This type of cancer can be detected and often prevented.  Routine colorectal cancer screening usually begins at 77 years of age and continues through 77 years of age.  Your health care provider may recommend screening at an earlier age if you have risk factors for colon cancer.  Your health care provider may also recommend using home test kits to check for hidden blood in the stool.  A small camera at the end of a tube can be used to examine your colon directly (sigmoidoscopy or colonoscopy). This is done to check for the earliest forms of colorectal cancer.  Routine screening usually begins at age 76.  Direct examination of the colon should be repeated every 5-10 years through 77 years of age. However, you may need to be screened more often if early forms of precancerous polyps or small growths are found.  Skin Cancer  Check your skin from head to toe regularly.  Tell your health care provider about any new moles or changes in moles, especially if there is a change in a mole's shape or color.  Also tell your health care provider if you have a mole that is larger than the size of a pencil eraser.  Always use sunscreen. Apply sunscreen liberally and repeatedly throughout the day.  Protect yourself by wearing long sleeves, pants, a wide-brimmed hat, and sunglasses whenever you are outside.  Heart disease, diabetes, and high blood pressure  High blood pressure causes heart disease and increases the risk of stroke. High blood pressure is more likely to develop in: ? People who have blood pressure in the high end of the normal range (130-139/85-89 mm  Hg). ? People who are overweight or obese. ? People who are African American.  If you are 65-37 years of age, have your blood pressure checked every 3-5 years. If you are 34 years of age or older, have your blood pressure checked every year. You should have your blood pressure measured twice-once when you are at a hospital or clinic, and once when you are not at a hospital or clinic. Record the average of the two measurements. To check your blood pressure when you are not at a hospital or clinic, you can use: ? An automated blood pressure machine at a pharmacy. ? A home blood pressure monitor.  If you are between 56 years and 61 years old, ask your health care provider if you should take aspirin to prevent strokes.  Have regular diabetes screenings. This involves taking a blood sample to check your fasting blood sugar level. ? If you are at a normal weight and have a low risk for diabetes, have this test once every three years after 77 years of age. ? If you are overweight and have a high risk for diabetes, consider being tested at a younger age or more often. Preventing infection Hepatitis B  If you have a higher risk for hepatitis B, you should be screened for this virus. You are considered at high risk for hepatitis B if: ? You were born in a country where hepatitis B is common. Ask your health care provider which countries are considered high risk. ? Your parents were born in  a high-risk country, and you have not been immunized against hepatitis B (hepatitis B vaccine). ? You have HIV or AIDS. ? You use needles to inject street drugs. ? You live with someone who has hepatitis B. ? You have had sex with someone who has hepatitis B. ? You get hemodialysis treatment. ? You take certain medicines for conditions, including cancer, organ transplantation, and autoimmune conditions.  Hepatitis C  Blood testing is recommended for: ? Everyone born from [birth year] through 1966. ? Anyone with known  risk factors for hepatitis C.  Sexually transmitted infections (STIs)  You should be screened for sexually transmitted infections (STIs) including gonorrhea and chlamydia if: ? You are sexually active and are younger than 77 years of age. ? You are older than 77 years of age and your health care provider tells you that you are at risk for this type of infection. ? Your sexual activity has changed since you were last screened and you are at an increased risk for chlamydia or gonorrhea. Ask your health care provider if you are at risk.  If you do not have HIV, but are at risk, it may be recommended that you take a prescription medicine daily to prevent HIV infection. This is called pre-exposure prophylaxis (PrEP). You are considered at risk if: ? You are sexually active and do not regularly use condoms or know the HIV status of your partner(s). ? You take drugs by injection. ? You are sexually active with a partner who has HIV.  Talk with your health care provider about whether you are at high risk of being infected with HIV. If you choose to begin PrEP, you should first be tested for HIV. You should then be tested every 3 months for as long as you are taking PrEP. Pregnancy  If you are premenopausal and you may become pregnant, ask your health care provider about preconception counseling.  If you may become pregnant, take 400 to 800 micrograms (mcg) of folic acid every day.  If you want to prevent pregnancy, talk to your health care provider about birth control (contraception). Osteoporosis and menopause  Osteoporosis is a disease in which the bones lose minerals and strength with aging. This can result in serious bone fractures. Your risk for osteoporosis can be identified using a bone density scan.  If you are 18 years of age or older, or if you are at risk for osteoporosis and fractures, ask your health care provider if you should be screened.  Ask your health care provider whether you  should take a calcium or vitamin D supplement to lower your risk for osteoporosis.  Menopause may have certain physical symptoms and risks.  Hormone replacement therapy may reduce some of these symptoms and risks. Talk to your health care provider about whether hormone replacement therapy is right for you. Follow these instructions at home:  Schedule regular health, dental, and eye exams.  Stay current with your immunizations.  Do not use any tobacco products including cigarettes, chewing tobacco, or electronic cigarettes.  If you are pregnant, do not drink alcohol.  If you are breastfeeding, limit how much and how often you drink alcohol.  Limit alcohol intake to no more than 1 drink per day for nonpregnant women. One drink equals 12 ounces of beer, 5 ounces of wine, or 1 ounces of hard liquor.  Do not use street drugs.  Do not share needles.  Ask your health care provider for help if you need support or information about  quitting drugs.  Tell your health care provider if you often feel depressed.  Tell your health care provider if you have ever been abused or do not feel safe at home. This information is not intended to replace advice given to you by your health care provider. Make sure you discuss any questions you have with your health care provider. Document Released: 11/16/2010 Document Revised: 10/09/2015 Document Reviewed: 02/04/2015 Elsevier Interactive Patient Education  [employer].

## 2016-10-18 NOTE — Progress Notes (Signed)
Subjective:   [name] is a 76 y.o. female who presents for Medicare Annual (Subsequent) preventive examination.  Review of Systems:  No ROS.  Medicare Wellness Visit. Cardiac Risk Factors include: advanced age (>84men, >44 women)   Sleep patterns: Sleeps 7 hours, feels rested. Up to void x 1.  Home Safety/Smoke Alarms:  Smoke detectors and security in place.  Living environment; residence and Firearm Safety: Lives with husband in 2 story home, rail at stairs. Feels safe in home. Firearms locked away.  Seat Belt Safety/Bike Helmet: Wears seat belt.   Counseling:   Eye Exam-Last exam 09/2016, yearly by [facility].  Dental-Every 6 months.   Female:   Pap-01/31/2014      Mammo-10/03/2014, benign. Not sure if she would like further testing, will call.  Dexa scan-12/31/2010, Osteoporosis. Not sure if she would like further testing, will call.         CCS-Colonoscopy 01/27/2004, normal.      Objective:     Vitals: BP (!) 150/78 (BP Location: Left Arm, Patient Position: Sitting, Cuff Size: Normal)   Pulse (!) 54   Ht 5\' 1"  (1.549 m)   Wt 148 lb 1.9 oz (67.2 kg)   SpO2 94%   BMI 27.99 kg/m   Body mass index is 27.99 kg/m.   Tobacco History  Smoking Status  . Never Smoker  Smokeless Tobacco  . Never Used     Counseling given: Not Answered   Past Medical History:  Diagnosis Date  . Allergic rhinitis due to pollen 11/30/2006   Skin test 10/15/09. Retest 10/30/10 Allergy vaccine d/c'd 09/2012 Significant component of irritant/ vasomotor rhinitis    . Chronic fatigue   . 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 Degenerative disk disease    (02/2011 CT scans).  Cervical (MRI 2001 & 2003).  Hx of thoracic epidural injections via Dr. Noel Gerold.  +Lumbar.  . Elevated blood pressure reading without diagnosis of hypertension    ? White coat HTN?   All home bp's normal.  . Fibromyalgia   . GERD (gastroesophageal reflux disease)     . Hypothyroidism   . Insomnia    Ambien occasionally  . Migraine syndrome   . Osteoarthritis   . Osteoporosis    Bisphosphonates no help.  Prolia caused side effects.  . Sacroiliac joint dysfunction   . Scoliosis deformity of spine   . Spinal stenosis of lumbar region with radiculopathy    + spondylolisthesis.  Dr. Wynn Banker with physical med/rehab did eval 08/2016 and did trial of tramadol instead of hydrocodone--he will likely be the MD to prescribe her pain meds in the future.  . Urinary incontinence    Past Surgical History:  Procedure Laterality Date  . BLADDER SUSPENSION  1983  . Bone density  01/2009; 12/2010   T -2.8 to -3.0  . CHOLECYSTECTOMY  2009  . COLONOSCOPY  01/27/2004   diverticulosis  . ESOPHAGOGASTRODUODENOSCOPY ENDOSCOPY  1983; 2005   GERD, distal esoph stricture, no Barrett's esoph  . RECTOCELE REPAIR    . ROTATOR CUFF REPAIR  2011   Right  . TOTAL KNEE ARTHROPLASTY  2008/2011   both knees  . VAGINAL HYSTERECTOMY  1983   Ovaries still in.  Nonmalignant reason.   Family History  Problem Relation Age of Onset  . Dementia Mother   . Lung cancer Father   . Irritable bowel syndrome Unknown   . Colon cancer Neg Hx   .  Breast cancer Neg Hx    History  Sexual Activity  . Sexual activity: Yes    Outpatient Encounter Prescriptions as of 10/18/2016  Medication Sig  . Aspirin-Caffeine (ANACIN PO) Take by mouth.  . levothyroxine (SYNTHROID, LEVOTHROID) 88 MCG tablet Take 1 tablet (88 mcg total) by mouth daily.  . Multiple Vitamins-Minerals (OCUVITE ADULT FORMULA) CAPS Take 1 capsule by mouth daily.   . multivitamin (THERAGRAN) per tablet Take 1 tablet by mouth daily.   . predniSONE (DELTASONE) 5 MG tablet Take 1 tablet (5 mg total) by mouth daily.  . SUMAtriptan (IMITREX) 100 MG tablet TAKE 1 TABLET BY MOUTH FOR MIGRAINES  . VYVANSE 40 MG capsule TAKE 1 CAPSULE BY MOUTH ONCE DAILY IN THE MORNING  . traMADol (ULTRAM) 50 MG tablet 1-2 tablet per day as needed  for pain (Patient not taking: Reported on 10/18/2016)  . traMADol (ULTRAM) 50 MG tablet Take 1 tablet (50 mg total) by mouth every 6 (six) hours as needed. (Patient not taking: Reported on 10/18/2016)   No facility-administered encounter medications on file as of 10/18/2016.     Activities of Daily Living In your present state of health, do you have any difficulty performing the following activities: 10/18/2016  Hearing? N  Vision? N  Difficulty concentrating or making decisions? N  Walking or climbing stairs? N  Dressing or bathing? N  Doing errands, shopping? N  Preparing Food and eating ? N  Using the Toilet? N  In the past six months, have you accidently leaked urine? N  Do you have problems with loss of bowel control? N  Managing your Medications? N  Managing your Finances? N  Housekeeping or managing your Housekeeping? N  Some recent data might be hidden    Patient Care Team: Jeoffrey MassedMcGowen, Philip H, MD as PCP - General (Family Medicine) Durene Romanslin, Matthew, MD as Consulting Physician (Orthopedic Surgery) Sheran Luzamos, Richard, MD as Consulting Physician (Physical Medicine and Rehabilitation) Carrington ClampHorvath, Michelle, MD as Consulting Physician (Obstetrics and Gynecology) Wynn BankerKirsteins, Victorino SparrowAndrew E, MD as Consulting Physician (Physical Medicine and Rehabilitation)    Assessment:    Physical assessment deferred to PCP.  Exercise Activities and Dietary recommendations Current Exercise Habits: The patient does not participate in regular exercise at present (Maintains household, gardening), Exercise limited by: orthopedic condition(s)   Diet (meal preparation, eat out, water intake, caffeinated beverages, dairy products, fruits and vegetables):  Drinks water, juice, milk and soda.   Breakfast: cereal, coffee Lunch: sandwich on whole wheat Dinner: fruit, cheese  Discussed heart healthy diet, encouraged to remain as active as possible.   Goals      Patient Stated   . Patient states (pt-stated)           Maintain current health.      Fall Risk Fall Risk  10/18/2016 08/30/2016 09/08/2015 09/08/2015 07/27/2013  Falls in the past year? No No No No No   Depression Screen PHQ 2/9 Scores 10/18/2016 08/30/2016 09/08/2015 09/08/2015  PHQ - 2 Score 0 0 0 0  PHQ- 9 Score - 1 - -     Cognitive Function       Ad8 score reviewed for issues:  Issues making decisions: no  Less interest in hobbies / activities: no  Repeats questions, stories (family complaining): no  Trouble using ordinary gadgets (microwave, computer, phone): no  Forgets the month or year: no  Mismanaging finances: no  Remembering appts: no  Daily problems with thinking and/or memory: no Ad8 score is=0     Immunization  History  Administered Date(s) Administered  . Influenza Split 02/10/2011, 02/01/2012  . Influenza Whole 05/17/2005, 02/15/2008, 03/04/2009, 02/12/2010  . Influenza, High Dose Seasonal PF 03/05/2013, 05/07/2015  . Influenza,inj,Quad PF,36+ Mos 01/10/2014  . Pneumococcal Conjugate-13 09/02/2014  . Pneumococcal Polysaccharide-23 05/17/2005  . Td 05/17/2005  . Zoster 05/16/2009   Screening Tests Health Maintenance  Topic Date Due  . TETANUS/TDAP  05/18/2015  . MAMMOGRAM  10/03/2015  . INFLUENZA VACCINE  12/15/2016  . DEXA SCAN  Completed  . PNA vac Low Risk Adult  Completed      Plan:    Bring a copy of your advance directives to your next office visit.  Continue doing brain stimulating activities (puzzles, reading, adult coloring books, staying active) to keep memory sharp.   Call when ready to schedule mammogram and bone scan.  I have personally reviewed and noted the following in the patient's chart:   . Medical and social history . Use of alcohol, tobacco or illicit drugs  . Current medications and supplements . Functional ability and status . Nutritional status . Physical activity . Advanced directives . List of other physicians . Hospitalizations, surgeries, and ER visits in  previous 12 months . Vitals . Screenings to include cognitive, depression, and falls . Referrals and appointments  In addition, I have reviewed and discussed with patient certain preventive protocols, quality metrics, and best practice recommendations. A written personalized care plan for preventive services as well as general preventive health recommendations were provided to patient.     Alysia Penna, RN  10/18/2016  PCP Note: -Patient undecided if she will continue mammogram and bone scans. -Scheduled for injections in neck and lower back -States Ultram does not work for back/hip pain -First reading today 150/82. BP Readings from Last 3 Encounters:  10/18/16 (!) 150/78  10/08/16 98/72  09/08/16 (!) 152/90  -F/U appointment with PCP on Wednesday, 10/20/2016 (Had TSH drawn today).

## 2016-10-19 LAB — TSH: TSH: 0.39 u[IU]/mL (ref 0.35–4.50)

## 2016-10-20 ENCOUNTER — Other Ambulatory Visit: Payer: Self-pay | Admitting: Family Medicine

## 2016-10-20 ENCOUNTER — Encounter: Payer: Self-pay | Admitting: Family Medicine

## 2016-10-20 ENCOUNTER — Ambulatory Visit (INDEPENDENT_AMBULATORY_CARE_PROVIDER_SITE_OTHER): Payer: Medicare Other | Admitting: Family Medicine

## 2016-10-20 VITALS — BP 117/73 | HR 87 | Temp 97.9°F | Resp 16 | Ht 60.75 in | Wt 146.5 lb

## 2016-10-20 DIAGNOSIS — M797 Fibromyalgia: Secondary | ICD-10-CM | POA: Diagnosis not present

## 2016-10-20 DIAGNOSIS — E039 Hypothyroidism, unspecified: Secondary | ICD-10-CM

## 2016-10-20 DIAGNOSIS — F5101 Primary insomnia: Secondary | ICD-10-CM | POA: Diagnosis not present

## 2016-10-20 NOTE — Progress Notes (Signed)
awv reviewed and agree.  Signed:  Santiago BumpersPhil McGowen, MD           10/20/2016

## 2016-10-20 NOTE — Progress Notes (Signed)
OFFICE VISIT  10/20/2016   CC:  Chief Complaint  Patient presents with  . Follow-up    Fibromyalgia and Hypothyroid   HPI:    Patient is a 76 y.o. Caucasian female who presents accompanied by her husband for 6 week f/u hypothyroidism , insomnia, and fibromyalgia. At last visit I added flexeril 10mg  bid prn.  She took this on 3 nights and says each time it caused electric shock sensation down legs.  This went away after stopping med.  She doesn't want to try any further med for fibromyalgia.  If she doesn't take 5mg  prednisone qd then she feels A LOT more pain diffusely throughout her body.  Recent TSH recheck after we had decreased her dose showed TSH now wnl.  She complains of constant fatigue and daytime somnolence.  No snoring or witness apneic events in sleep per husband.  She describes good sleep hygiene but still only gets 4-5 hours of broken sleep every night. Doesn't want rx sleep aid.  Tried melatonin for 1-2 nights in the past and did not benefit.  Past Medical History:  Diagnosis Date  . Allergic rhinitis due to pollen 11/30/2006   Skin test 10/15/09. Retest 10/30/10 Allergy vaccine d/c'd 09/2012 Significant component of irritant/ vasomotor rhinitis    . Chronic fatigue   . 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 Degenerative disk disease    (02/2011 CT scans).  Cervical (MRI 2001 & 2003).  Hx of thoracic epidural injections via Dr. Noel Geroldohen.  +Lumbar.  . Elevated blood pressure reading without diagnosis of hypertension    ? White coat HTN?   All home bp's normal.  . Fibromyalgia   . GERD (gastroesophageal reflux disease)   . Hypothyroidism   . Insomnia    Ambien occasionally  . Migraine syndrome   . Osteoarthritis   . Osteoporosis    Bisphosphonates no help.  Prolia caused side effects.  . Sacroiliac joint dysfunction   . Scoliosis deformity of spine   . Spinal stenosis of lumbar region with radiculopathy    +  spondylolisthesis.  Dr. Wynn BankerKirsteins with physical med/rehab did eval 08/2016 and did trial of tramadol instead of hydrocodone--he will likely be the MD to prescribe her pain meds in the future.  . Urinary incontinence     Past Surgical History:  Procedure Laterality Date  . BLADDER SUSPENSION  1983  . Bone density  01/2009; 12/2010   T -2.8 to -3.0  . CHOLECYSTECTOMY  2009  . COLONOSCOPY  01/27/2004   diverticulosis  . ESOPHAGOGASTRODUODENOSCOPY ENDOSCOPY  1983; 2005   GERD, distal esoph stricture, no Barrett's esoph  . RECTOCELE REPAIR    . ROTATOR CUFF REPAIR  2011   Right  . TOTAL KNEE ARTHROPLASTY  2008/2011   both knees  . VAGINAL HYSTERECTOMY  1983   Ovaries still in.  Nonmalignant reason.    Outpatient Medications Prior to Visit  Medication Sig Dispense Refill  . Aspirin-Caffeine (ANACIN PO) Take by mouth.    . levothyroxine (SYNTHROID, LEVOTHROID) 88 MCG tablet Take 1 tablet (88 mcg total) by mouth daily. 90 tablet 0  . Multiple Vitamins-Minerals (OCUVITE ADULT FORMULA) CAPS Take 1 capsule by mouth daily.     . multivitamin (THERAGRAN) per tablet Take 1 tablet by mouth daily.     . predniSONE (DELTASONE) 5 MG tablet Take 1 tablet (5 mg total) by mouth daily. 30 tablet 6  . SUMAtriptan (IMITREX) 100 MG tablet  TAKE 1 TABLET BY MOUTH FOR MIGRAINES 9 tablet 12  . VYVANSE 40 MG capsule TAKE 1 CAPSULE BY MOUTH ONCE DAILY IN THE MORNING 30 capsule 0  . traMADol (ULTRAM) 50 MG tablet 1-2 tablet per day as needed for pain (Patient not taking: Reported on 10/18/2016) 60 tablet 2  . traMADol (ULTRAM) 50 MG tablet Take 1 tablet (50 mg total) by mouth every 6 (six) hours as needed. (Patient not taking: Reported on 10/18/2016) 65 tablet 2   No facility-administered medications prior to visit.     Allergies  Allergen Reactions  . Terbinafine Hcl Hives  . Prolia [Denosumab] Rash  . Thimerosal Swelling    Redness  . Lamisil  [Terbinafine Hcl] Rash    ROS As per HPI  PE: Blood  pressure 117/73, pulse 87, temperature 97.9 F (36.6 C), temperature source Oral, resp. rate 16, height 5' 0.75" (1.543 m), weight 146 lb 8 oz (66.5 kg), SpO2 98 %. Gen: Alert, well appearing.  Patient is oriented to person, place, time, and situation. AFFECT: pleasant, lucid thought and speech. BJY:NWGN: no injection, icteris, swelling, or exudate.  EOMI, PERRLA. Mouth: lips without lesion/swelling.  Oral mucosa pink and moist. Oropharynx without erythema, exudate, or swelling.    LABS:   Lab Results  Component Value Date   TSH 0.39 10/18/2016   T3TOTAL 125.0 02/02/2012     Chemistry      Component Value Date/Time   NA 142 06/10/2016 1530   K 4.4 06/10/2016 1530   CL 105 06/10/2016 1530   CO2 28 06/10/2016 1530   BUN 22 06/10/2016 1530   CREATININE 0.61 06/10/2016 1530      Component Value Date/Time   CALCIUM 9.4 06/10/2016 1530   ALKPHOS 53 03/05/2013 1603   AST 22 03/05/2013 1603   ALT 17 03/05/2013 1603   BILITOT 0.6 03/05/2013 1603      IMPRESSION AND PLAN:  1) Dry mouth/throat--leading to discomfort in throat and some cough: I think this is due to the drying effect of her vyvanse.  I told her to try emptying the capsule contents onto a counter and splitting the granules into two equal piles, take 1/2 qAM instead of whole and see if sx's abate and she still has some positive effect from the med.  If this works, then she'll call and request new rx for vyvanse 20mg  qd dosing.  2) Fibromyalgia: she wants to stay on current regimen of prednisone 5mg  qd. She is aware that this is not a med used for fibromyalgia and she has never had a inflammatory condition identified on testing.  She insists she is MUCH worse regarding her pain if she is not taking this med. She is aware of the short term and long term potential side effects of this med. Recent failure of flexeril trial noted.  3) Hypothyroidism: TSH wnl now.  Continue current levothyroxine dosing.  4) Insomnia; primary  (initial and maintenance): she will try melatonin again, 10mg  qhs---emphasized the need for her to take this nightly for at least 14d to be considered an adequate trial.  An After Visit Summary was printed and given to the patient.  FOLLOW UP: Return in about 6 months (around 04/21/2017) for routine chronic illness f/u (30 min).  Signed:  Santiago Bumpers, MD           10/20/2016

## 2016-10-21 NOTE — Telephone Encounter (Signed)
[facility]  RF request for levothyroxine LOV: 10/20/16 Next ov: 04/20/17 Last written: 08/24/16 #90 w/ 0RF  10/18/16 TSH 0.39

## 2016-10-22 ENCOUNTER — Encounter: Payer: Self-pay | Admitting: Family Medicine

## 2017-01-07 ENCOUNTER — Encounter: Payer: Medicare Other | Attending: Physical Medicine & Rehabilitation

## 2017-01-07 ENCOUNTER — Ambulatory Visit (HOSPITAL_BASED_OUTPATIENT_CLINIC_OR_DEPARTMENT_OTHER): Payer: Medicare Other | Admitting: Physical Medicine & Rehabilitation

## 2017-01-07 ENCOUNTER — Encounter: Payer: Self-pay | Admitting: Physical Medicine & Rehabilitation

## 2017-01-07 VITALS — BP 148/89 | HR 64

## 2017-01-07 DIAGNOSIS — M5416 Radiculopathy, lumbar region: Secondary | ICD-10-CM | POA: Insufficient documentation

## 2017-01-07 DIAGNOSIS — M5136 Other intervertebral disc degeneration, lumbar region: Secondary | ICD-10-CM | POA: Diagnosis not present

## 2017-01-07 DIAGNOSIS — M4186 Other forms of scoliosis, lumbar region: Secondary | ICD-10-CM | POA: Insufficient documentation

## 2017-01-07 DIAGNOSIS — M4316 Spondylolisthesis, lumbar region: Secondary | ICD-10-CM | POA: Insufficient documentation

## 2017-01-07 DIAGNOSIS — M810 Age-related osteoporosis without current pathological fracture: Secondary | ICD-10-CM | POA: Diagnosis not present

## 2017-01-07 DIAGNOSIS — G894 Chronic pain syndrome: Secondary | ICD-10-CM | POA: Diagnosis not present

## 2017-01-07 DIAGNOSIS — J449 Chronic obstructive pulmonary disease, unspecified: Secondary | ICD-10-CM | POA: Diagnosis not present

## 2017-01-07 DIAGNOSIS — M4126 Other idiopathic scoliosis, lumbar region: Secondary | ICD-10-CM

## 2017-01-07 DIAGNOSIS — M48061 Spinal stenosis, lumbar region without neurogenic claudication: Secondary | ICD-10-CM

## 2017-01-07 DIAGNOSIS — R5382 Chronic fatigue, unspecified: Secondary | ICD-10-CM | POA: Diagnosis not present

## 2017-01-07 MED ORDER — TRAMADOL HCL 50 MG PO TABS: 50.0000 mg | ORAL_TABLET | Freq: Four times a day (QID) | ORAL | 5 refills | Status: DC | PRN

## 2017-01-07 NOTE — Progress Notes (Signed)
Subjective:    Patient ID: [name], female    DOB: October 24, 1940, 76 y.o.   MRN: [id number]  HPI Has injections from Dr Ethelene Hal  Both cervical and lumbar injections  Doesn't Remember exactly what type of injection she gets. Has been seeing him for 14 years. Did not realize that Dr. Ethelene Hal can prescribe medications  She has some partial relief with tramadol. She occasionally takes 3 a day and this works better than the 2 a day  Pain Inventory Average Pain 0 Pain Right Now 6 My pain is .  In the last 24 hours, has pain interfered with the following? General activity 8 Relation with others 8 Enjoyment of life 8 What TIME of day is your pain at its worst? . Sleep (in general) Fair  Pain is worse with: walking and standing Pain improves with: medication Relief from Meds: 5  Mobility walk without assistance ability to climb steps?  yes do you drive?  yes  Function I need assistance with the following:  shopping  Neuro/Psych bladder control problems  Prior Studies Any changes since last visit?  no  Physicians involved in your care Any changes since last visit?  no   Family History  Problem Relation Age of Onset  . Dementia Mother   . Lung cancer Father   . Irritable bowel syndrome Unknown   . Colon cancer Neg Hx   . Breast cancer Neg Hx    Social History   Social History  . Marital status: Married    Spouse name: N/A  . Number of children: N/A  . Years of education: N/A   Occupational History  . retired Retired   Social History Main Topics  . Smoking status: Never Smoker  . Smokeless tobacco: Never Used  . Alcohol use No  . Drug use: No  . Sexual activity: Yes   Other Topics Concern  . Not on file   Social History Narrative   Married 53  Years, has 2 sons.   Orig from [place].   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   No T/A/Ds.   Tries to eat carb-conscious diet.         Past Surgical History:  Procedure  Laterality Date  . BLADDER SUSPENSION  1983  . Bone density  01/2009; 12/2010   T -2.8 to -3.0  . CHOLECYSTECTOMY  2009  . COLONOSCOPY  01/27/2004   diverticulosis  . ESOPHAGOGASTRODUODENOSCOPY ENDOSCOPY  1983; 2005   GERD, distal esoph stricture, no Barrett's esoph  . RECTOCELE REPAIR    . ROTATOR CUFF REPAIR  2011   Right  . TOTAL KNEE ARTHROPLASTY  2008/2011   both knees  . VAGINAL HYSTERECTOMY  1983   Ovaries still in.  Nonmalignant reason.   Past Medical History:  Diagnosis Date  . Allergic rhinitis due to pollen 11/30/2006   Skin test 10/15/09. Retest 10/30/10 Allergy vaccine d/c'd 09/2012 Significant component of irritant/ vasomotor rhinitis    . Chronic fatigue   . 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 Degenerative disk disease    (02/2011 CT scans).  Cervical (MRI 2001 & 2003).  Hx of thoracic epidural injections via Dr. Noel Gerold.  +Lumbar.  . Elevated blood pressure reading without diagnosis of hypertension    ? White coat HTN?   All home bp's normal.  . Fibromyalgia   . GERD (gastroesophageal reflux disease)   . Hypothyroidism   .  Insomnia    Ambien occasionally  . Migraine syndrome   . Osteoarthritis   . Osteoporosis    Bisphosphonates no help.  Prolia caused side effects.  . Sacroiliac joint dysfunction   . Scoliosis deformity of spine   . Spinal stenosis of lumbar region with radiculopathy    + spondylolisthesis.  Dr. Wynn Banker with physical med/rehab did eval 08/2016 and did trial of tramadol instead of hydrocodone--he will be the MD to prescribe her pain meds in the future.  . Urinary incontinence    There were no vitals taken for this visit.  Opioid Risk Score:   Fall Risk Score:  `1  Depression screen PHQ 2/9  Depression screen [facility] 2/9 10/18/2016 08/30/2016 09/08/2015 09/08/2015 07/27/2013 11/24/2012  Decreased Interest 0 0 0 0 0 1  Down, Depressed, Hopeless 0 0 0 0 0 0  PHQ - 2 Score 0 0 0 0 0 1  Altered  sleeping - 0 - - - -  Tired, decreased energy - 0 - - - -  Change in appetite - 1 - - - -  Feeling bad or failure about yourself  - 0 - - - -  Trouble concentrating - 0 - - - -  Moving slowly or fidgety/restless - 0 - - - -  Suicidal thoughts - 0 - - - -  PHQ-9 Score - 1 - - - -     Review of Systems  Constitutional: Positive for chills and fever.  HENT: Negative.   Eyes: Negative.   Respiratory: Positive for cough.   Cardiovascular: Negative.   Gastrointestinal: Negative.   Endocrine: Negative.   Genitourinary: Negative.   Musculoskeletal: Positive for joint swelling.  Skin: Negative.   Allergic/Immunologic: Negative.   Neurological: Negative.   Hematological: Bruises/bleeds easily.  Psychiatric/Behavioral: Negative.   All other systems reviewed and are negative.      Objective:   Physical Exam  Constitutional: She is oriented to person, place, and time. She appears well-developed and well-nourished. No distress.  HENT:  Head: Normocephalic and atraumatic.  Eyes: Pupils are equal, round, and reactive to light. Conjunctivae and EOM are normal.  Neck: Normal range of motion.  Musculoskeletal:       Lumbar back: She exhibits decreased range of motion and deformity. She exhibits no tenderness and no bony tenderness.  S-shaped scoliotic curve thoracic dextroconvex, lumbar levo convex Full flexion and extension, however 50%, lateral bending to the right and to the left  Neurological: She is alert and oriented to person, place, and time. Gait abnormal.  Motor strength is 5/5 in bilateral biceps, triceps, finger flexors, right shoulder, limited 3 minus due to pain and decreased range of motion left shoulder 5/5 Lower extremity strength 5/5 bilateral hip flexor, knee extensor, ankle dorsiflexor.  Forward flexed posture   Skin: She is not diaphoretic.  Psychiatric: She has a normal mood and affect. She is slowed. She is inattentive.  Nursing note and vitals  reviewed.         Assessment & Plan:  1. Lumbar scoliosis with poor standing tolerance and walking tolerance. I suspect she has facet mediated pain. I recommend stationary bicycling as a form of exercise. Will increase tramadol to 50 g 3 times a day #90, 5 refills. May benefit from medial branch blocks and possible lumbar radiofrequency most of her pain is on the left side. Since she already sees physiatry, Dr. Ethelene Hal, I discussed that it would be reasonable for him to assume the tramadol prescription and perform  her lumbar and cervical injections.  I'll see her back when necessary

## 2017-01-07 NOTE — Patient Instructions (Addendum)
Please discussed lumbar medial branch blocks and lumbar radiofrequency procedure with Dr. Ethelene Hal. I suspect that you have lumbar facet mediated pain. Given your history of poor standing tolerance and pain during walking. Also, the lumbar scoliosis can worsen symptoms of lumbar facet pain. Will increase the tramadol to 50 mg 3 times a day, please ask Dr. Ethelene Hal whether he would be comfortable  to prescribe this. Since you see him for the injection procedures already.  May try therma wrap to neck for long car rides

## 2017-01-10 ENCOUNTER — Other Ambulatory Visit: Payer: Self-pay | Admitting: Family Medicine

## 2017-01-11 NOTE — Telephone Encounter (Signed)
Pt advised Rx is ready for p/u at our front desk.

## 2017-01-11 NOTE — Telephone Encounter (Signed)
[facility]  RF request for vyvanse LOV: 09/08/16 Next ov: 04/20/17 Last written: 09/20/16 #30 w/ 0RF  Medication no longer on med list.   Please advise. Thanks.

## 2017-01-31 ENCOUNTER — Other Ambulatory Visit: Payer: Self-pay | Admitting: Family Medicine

## 2017-02-02 ENCOUNTER — Encounter: Payer: Self-pay | Admitting: Family Medicine

## 2017-02-28 ENCOUNTER — Telehealth: Payer: Self-pay | Admitting: Family Medicine

## 2017-02-28 NOTE — Telephone Encounter (Signed)
Ok to schedule.

## 2017-02-28 NOTE — Telephone Encounter (Signed)
LM for pt to return call to schedule an appt with Dr. Beverely Low.

## 2017-02-28 NOTE — Telephone Encounter (Signed)
OK with me.

## 2017-02-28 NOTE — Telephone Encounter (Signed)
Pt  Would like to transfer care from Dr. Milinda Cave to Dr. Beverely Low, Please advise ok to schedule

## 2017-03-03 ENCOUNTER — Ambulatory Visit: Payer: Medicare Other | Admitting: Family Medicine

## 2017-03-24 ENCOUNTER — Other Ambulatory Visit: Payer: Self-pay | Admitting: Family Medicine

## 2017-03-24 MED ORDER — LISDEXAMFETAMINE DIMESYLATE 40 MG PO CAPS: ORAL_CAPSULE | ORAL | 0 refills | Status: DC

## 2017-03-24 NOTE — Telephone Encounter (Signed)
Rx put up front for p/u. Pt advised and voiced understanding.   

## 2017-03-24 NOTE — Telephone Encounter (Signed)
Patient left VM on front desk phone requesting Vyvanse refill.

## 2017-03-24 NOTE — Telephone Encounter (Signed)
RF request for vyvanse LOV: 10/20/16 Next ov: 04/20/17 Last written: 01/11/17 #30 w/ [id number]  Please advise. Thanks.

## 2017-04-20 ENCOUNTER — Ambulatory Visit (INDEPENDENT_AMBULATORY_CARE_PROVIDER_SITE_OTHER): Payer: Medicare Other | Admitting: Family Medicine

## 2017-04-20 ENCOUNTER — Encounter: Payer: Self-pay | Admitting: Family Medicine

## 2017-04-20 VITALS — BP 142/94 | HR 104 | Temp 98.0°F | Resp 16 | Ht 60.75 in | Wt 146.2 lb

## 2017-04-20 DIAGNOSIS — Z23 Encounter for immunization: Secondary | ICD-10-CM | POA: Diagnosis not present

## 2017-04-20 DIAGNOSIS — Z131 Encounter for screening for diabetes mellitus: Secondary | ICD-10-CM | POA: Diagnosis not present

## 2017-04-20 DIAGNOSIS — E039 Hypothyroidism, unspecified: Secondary | ICD-10-CM | POA: Diagnosis not present

## 2017-04-20 DIAGNOSIS — Z13220 Encounter for screening for lipoid disorders: Secondary | ICD-10-CM

## 2017-04-20 DIAGNOSIS — M797 Fibromyalgia: Secondary | ICD-10-CM | POA: Diagnosis not present

## 2017-04-20 DIAGNOSIS — Z7952 Long term (current) use of systemic steroids: Secondary | ICD-10-CM

## 2017-04-20 DIAGNOSIS — R41840 Attention and concentration deficit: Secondary | ICD-10-CM | POA: Diagnosis not present

## 2017-04-20 DIAGNOSIS — R5382 Chronic fatigue, unspecified: Secondary | ICD-10-CM

## 2017-04-20 DIAGNOSIS — Z79899 Other long term (current) drug therapy: Secondary | ICD-10-CM | POA: Diagnosis not present
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
## 2017-04-30 ENCOUNTER — Other Ambulatory Visit: Payer: Self-pay | Admitting: Family Medicine

## 2017-05-06 ENCOUNTER — Telehealth: Payer: Medicare Other | Admitting: Family

## 2017-05-06 DIAGNOSIS — J029 Acute pharyngitis, unspecified: Secondary | ICD-10-CM

## 2017-05-06 MED ORDER — BENZONATATE 100 MG PO CAPS: 100.0000 mg | ORAL_CAPSULE | Freq: Three times a day (TID) | ORAL | 0 refills | Status: DC | PRN

## 2017-05-06 NOTE — Progress Notes (Signed)
Thank you for the details you included in the comment boxes. Those details are very helpful in determining the best course of treatment for you and help us to provide the best care.  We are sorry that you are not feeling well.  Here is how we plan to help!  Based on your presentation I believe you most likely have A cough due to a virus.  This is called viral bronchitis and is best treated by rest, plenty of fluids and control of the cough.  You may use Ibuprofen or Tylenol as directed to help your symptoms.     In addition you may use A non-prescription cough medication called Mucinex DM: take 2 tablets every 12 hours. and A prescription cough medication called Tessalon Perles 100mg . You may take 1 capsule every 8 hours as needed for your cough.   From your responses in the eVisit questionnaire you describe inflammation in the upper respiratory tract which is causing a significant cough.  This is commonly called Bronchitis and has four common causes:    Allergies  Viral Infections  Acid Reflux  Bacterial Infection Allergies, viruses and acid reflux are treated by controlling symptoms or eliminating the cause. An example might be a cough caused by taking certain blood pressure medications. You stop the cough by changing the medication. Another example might be a cough caused by acid reflux. Controlling the reflux helps control the cough.  USE OF BRONCHODILATOR ("RESCUE") INHALERS: There is a risk from using your bronchodilator too frequently.  The risk is that over-reliance on a medication which only relaxes the muscles surrounding the breathing tubes can reduce the effectiveness of medications prescribed to reduce swelling and congestion of the tubes themselves.  Although you feel brief relief from the bronchodilator inhaler, your asthma may actually be worsening with the tubes becoming more swollen and filled with mucus.  This can delay other crucial treatments, such as oral steroid  medications. If you need to use a bronchodilator inhaler daily, several times per day, you should discuss this with your provider.  There are probably better treatments that could be used to keep your asthma under control.     HOME CARE . Only take medications as instructed by your medical team. . Complete the entire course of an antibiotic. . Drink plenty of fluids and get plenty of rest. . Avoid close contacts especially the very young and the elderly . Cover your mouth if you cough or cough into your sleeve. . Always remember to wash your hands . A steam or ultrasonic humidifier can help congestion.   GET HELP RIGHT AWAY IF: . You develop worsening fever. . You become short of breath . You cough up blood. . Your symptoms persist after you have completed your treatment plan MAKE SURE YOU   Understand these instructions.  Will watch your condition.  Will get help right away if you are not doing well or get worse.  Your e-visit answers were reviewed by a board certified advanced clinical practitioner to complete your personal care plan.  Depending on the condition, your plan could have included both over the counter or prescription medications. If there is a problem please reply  once you have received a response from your provider. Your safety is important to us.  If you have drug allergies check your prescription carefully.    You can use MyChart to ask questions about today's visit, request a non-urgent call back, or ask for a work or school excuse for  24 hours related to this e-Visit. If it has been greater than 24 hours you will need to follow up with your provider, or enter a new e-Visit to address those concerns. You will get an e-mail in the next two days asking about your experience.  I hope that your e-visit has been valuable and will speed your recovery. Thank you for using e-visits.

## 2017-05-12 DIAGNOSIS — H25813 Combined forms of age-related cataract, bilateral: Secondary | ICD-10-CM | POA: Insufficient documentation

## 2017-05-18 ENCOUNTER — Other Ambulatory Visit: Payer: Self-pay | Admitting: Family Medicine

## 2017-05-19 NOTE — Telephone Encounter (Signed)
BP >140/90 at last 2 ov. Please advise. Thanks.

## 2017-05-27 ENCOUNTER — Ambulatory Visit (INDEPENDENT_AMBULATORY_CARE_PROVIDER_SITE_OTHER): Payer: Medicare Other | Admitting: Family Medicine

## 2017-05-27 ENCOUNTER — Encounter: Payer: Self-pay | Admitting: Family Medicine

## 2017-05-27 ENCOUNTER — Other Ambulatory Visit: Payer: Self-pay

## 2017-05-27 VITALS — BP 132/82 | HR 78 | Temp 98.0°F | Resp 16 | Ht 61.0 in | Wt 145.0 lb

## 2017-05-27 DIAGNOSIS — M5412 Radiculopathy, cervical region: Secondary | ICD-10-CM | POA: Diagnosis not present

## 2017-05-27 DIAGNOSIS — E039 Hypothyroidism, unspecified: Secondary | ICD-10-CM | POA: Diagnosis not present

## 2017-05-27 DIAGNOSIS — E663 Overweight: Secondary | ICD-10-CM

## 2017-05-27 DIAGNOSIS — M545 Low back pain, unspecified: Secondary | ICD-10-CM

## 2017-05-27 NOTE — Progress Notes (Signed)
   Subjective:    Patient ID: [name], female    DOB: 02/26/1941, 77 y.o.   MRN: [id number]  HPI Hypothyroid- chronic problem, currently on Synthroid 88mcg daily.  Pt reports chronic fatigue, regardless of how much she sleeps.  No CP, SOB.  No abd pain, N/V.  Overweight- pt's BMI is 27.4.  No regular exercise.  Doesn't follow a particular diet but does try and limit sugar.  Pt does not eat breakfast and eating is sporadic.  Chronic back pain- pt is receiving injections from Dr Ethelene Halamos in both neck and lower back.  He provides her w/ hydrocodone to use as needed for pain.  GYN- [facility]  Health Maintenance- pt has aged out of colonoscopy, UTD on pneumonia and flu vaccines.   Review of Systems For ROS see HPI     Objective:   Physical Exam  Constitutional: She is oriented to person, place, and time. She appears well-developed and well-nourished. No distress.  overweight  HENT:  Head: Normocephalic and atraumatic.  Eyes: Conjunctivae and EOM are normal. Pupils are equal, round, and reactive to light.  Neck: Normal range of motion. Neck supple. No thyromegaly present.  Cardiovascular: Normal rate, regular rhythm, normal heart sounds and intact distal pulses.  No murmur heard. Pulmonary/Chest: Effort normal and breath sounds normal. No respiratory distress.  Abdominal: Soft. She exhibits no distension. There is no tenderness.  Musculoskeletal: She exhibits no edema.  Lymphadenopathy:    She has no cervical adenopathy.  Neurological: She is alert and oriented to person, place, and time.  Skin: Skin is warm and dry.  Psychiatric: She has a normal mood and affect. Her behavior is normal.  Vitals reviewed.         Assessment & Plan:

## 2017-05-27 NOTE — Assessment & Plan Note (Signed)
Chronic problem.  Following w/ Dr Ethelene Halamos for injxns and pain medicines.

## 2017-05-27 NOTE — Assessment & Plan Note (Signed)
Chronic problem.  Seeing Dr Ethelene Halamos for injections and pain meds.  Will follow.

## 2017-05-27 NOTE — Assessment & Plan Note (Signed)
Chronic problem.  Pt reports chronic fatigue and is worried her thyroid dose is wrong.  Reviewed that chronic pain and age can also play a large role in fatigue.  Check labs.  Adjust meds prn

## 2017-05-27 NOTE — Patient Instructions (Signed)
Schedule your Medicare Wellness Visit w/ Selena BattenKim in 6 months and a follow up with me at the same time to recheck your thyroid We'll notify you of your lab results and make any changes if needed Continue to work on healthy diet and walk/exercise as able Call with any questions or concerns Welcome!  We're glad to have you! Happy New Year!

## 2017-05-27 NOTE — Assessment & Plan Note (Signed)
New to provider, ongoing for pt as she is not able to exercise due to multiple orthopedic issues.  Stressed need for healthy diet.  Check labs to risk stratify.  Will follow.

## 2017-05-28 LAB — HEPATIC FUNCTION PANEL
AG Ratio: 2.2 (calc) (ref 1.0–2.5)
ALBUMIN MSPROF: 4 g/dL (ref 3.6–5.1)
ALKALINE PHOSPHATASE (APISO): 98 U/L (ref 33–130)
ALT: 32 U/L — ABNORMAL HIGH (ref 6–29)
AST: 24 U/L (ref 10–35)
BILIRUBIN DIRECT: 0.1 mg/dL (ref 0.0–0.2)
BILIRUBIN INDIRECT: 0.2 mg/dL (ref 0.2–1.2)
BILIRUBIN TOTAL: 0.3 mg/dL (ref 0.2–1.2)
Globulin: 1.8 g/dL (calc) — ABNORMAL LOW (ref 1.9–3.7)
Total Protein: 5.8 g/dL — ABNORMAL LOW (ref 6.1–8.1)

## 2017-05-28 LAB — BASIC METABOLIC PANEL
BUN / CREAT RATIO: 39 (calc) — AB (ref 6–22)
BUN: 22 mg/dL (ref 7–25)
CHLORIDE: 108 mmol/L (ref 98–110)
CO2: 28 mmol/L (ref 20–32)
CREATININE: 0.57 mg/dL — AB (ref 0.60–0.93)
Calcium: 9.4 mg/dL (ref 8.6–10.4)
GLUCOSE: 90 mg/dL (ref 65–99)
POTASSIUM: 4.5 mmol/L (ref 3.5–5.3)
Sodium: 142 mmol/L (ref 135–146)

## 2017-05-28 LAB — LIPID PANEL
CHOL/HDL RATIO: 2.5 (calc) (ref <5.0)
Cholesterol: 199 mg/dL (ref <200)
HDL: 79 mg/dL (ref >50)
LDL Cholesterol (Calc): 103 mg/dL (calc) — ABNORMAL HIGH
NON-HDL CHOLESTEROL (CALC): 120 mg/dL (ref <130)
Triglycerides: 81 mg/dL (ref <150)

## 2017-05-28 LAB — CBC WITH DIFFERENTIAL/PLATELET
BASOS ABS: 39 {cells}/uL (ref 0–200)
Basophils Relative: 0.7 %
EOS PCT: 4.3 %
Eosinophils Absolute: 241 cells/uL (ref 15–500)
HCT: 37.8 % (ref 35.0–45.0)
Hemoglobin: 12.9 g/dL (ref 11.7–15.5)
LYMPHS ABS: 2279 {cells}/uL (ref 850–3900)
MCH: 30.9 pg (ref 27.0–33.0)
MCHC: 34.1 g/dL (ref 32.0–36.0)
MCV: 90.4 fL (ref 80.0–100.0)
MPV: 10.6 fL (ref 7.5–12.5)
Monocytes Relative: 11.2 %
NEUTROS PCT: 43.1 %
Neutro Abs: 2414 cells/uL (ref 1500–7800)
Platelets: 172 10*3/uL (ref 140–400)
RBC: 4.18 10*6/uL (ref 3.80–5.10)
RDW: 12.4 % (ref 11.0–15.0)
Total Lymphocyte: 40.7 %
WBC mixed population: 627 cells/uL (ref 200–950)
WBC: 5.6 10*3/uL (ref 3.8–10.8)

## 2017-05-28 LAB — TSH: TSH: 0.14 mIU/L — ABNORMAL LOW (ref 0.40–4.50)

## 2017-05-28 LAB — T4, FREE: FREE T4: 1.6 ng/dL (ref 0.8–1.8)

## 2017-05-28 LAB — T3, FREE: T3, Free: 3.2 pg/mL (ref 2.3–4.2)

## 2017-05-31 ENCOUNTER — Telehealth: Payer: Self-pay | Admitting: Family Medicine

## 2017-05-31 ENCOUNTER — Other Ambulatory Visit: Payer: Self-pay | Admitting: General Practice

## 2017-05-31 ENCOUNTER — Other Ambulatory Visit: Payer: Self-pay | Admitting: Family Medicine

## 2017-05-31 DIAGNOSIS — E039 Hypothyroidism, unspecified: Secondary | ICD-10-CM

## 2017-05-31 MED ORDER — SYNTHROID 75 MCG PO TABS
75.0000 ug | ORAL_TABLET | Freq: Every day | ORAL | 3 refills | Status: DC
Start: 2017-05-31 — End: 2017-08-19

## 2017-05-31 NOTE — Telephone Encounter (Signed)
Just spoke with pt, advised that this medication was just sent in to the pharmacy. I had to speak with pt in regards to her labs before I could send this in.   Pt was given #30 as she is having a repeat TSH in one month. If stable we will give her #90 at that time.

## 2017-05-31 NOTE — Telephone Encounter (Signed)
Copied from CRM [id number]. Topic: Quick Communication - See Telephone Encounter >> May 31, 2017 11:27 AM Jolayne Hainesaylor, Brittany L wrote: CRM for notification. See Telephone encounter for:   05/31/17.  Patient said she seen Dr Beverely Lowabori yesterday and she advised her she was going to call her in SYNTHROID ( a lower dose). She said it is not at the pharmacy.  [facility] [place] - [address]

## 2017-07-04 ENCOUNTER — Other Ambulatory Visit (INDEPENDENT_AMBULATORY_CARE_PROVIDER_SITE_OTHER): Payer: Medicare Other

## 2017-07-04 DIAGNOSIS — E039 Hypothyroidism, unspecified: Secondary | ICD-10-CM | POA: Diagnosis not present

## 2017-07-04 LAB — TSH: TSH: 0.9 u[IU]/mL (ref 0.35–4.50)

## 2017-07-05 ENCOUNTER — Encounter: Payer: Self-pay | Admitting: General Practice

## 2017-07-19 ENCOUNTER — Other Ambulatory Visit: Payer: Self-pay

## 2017-07-19 ENCOUNTER — Encounter: Payer: Self-pay | Admitting: Family Medicine

## 2017-07-19 ENCOUNTER — Ambulatory Visit (INDEPENDENT_AMBULATORY_CARE_PROVIDER_SITE_OTHER): Payer: Medicare Other | Admitting: Family Medicine

## 2017-07-19 VITALS — BP 118/82 | HR 78 | Temp 98.2°F | Ht 61.0 in | Wt 140.2 lb

## 2017-07-19 DIAGNOSIS — J029 Acute pharyngitis, unspecified: Secondary | ICD-10-CM | POA: Diagnosis not present

## 2017-07-19 DIAGNOSIS — J069 Acute upper respiratory infection, unspecified: Secondary | ICD-10-CM

## 2017-07-19 DIAGNOSIS — R509 Fever, unspecified: Secondary | ICD-10-CM

## 2017-07-19 LAB — POC INFLUENZA A&B (BINAX/QUICKVUE)
INFLUENZA B, POC: NEGATIVE
Influenza A, POC: NEGATIVE

## 2017-07-19 LAB — POCT RAPID STREP A (OFFICE): RAPID STREP A SCREEN: NEGATIVE

## 2017-07-19 NOTE — Progress Notes (Signed)
A/P:

## 2017-07-19 NOTE — Patient Instructions (Signed)
Upper Respiratory Infection, Adult Most upper respiratory infections (URIs) are a viral infection of the air passages leading to the lungs. A URI affects the nose, throat, and upper air passages. The most common type of URI is nasopharyngitis and is typically referred to as "the common cold." URIs run their course and usually go away on their own. Most of the time, a URI does not require medical attention, but sometimes a bacterial infection in the upper airways can follow a viral infection. This is called a secondary infection. Sinus and middle ear infections are common types of secondary upper respiratory infections. Bacterial pneumonia can also complicate a URI. A URI can worsen asthma and chronic obstructive pulmonary disease (COPD). Sometimes, these complications can require emergency medical care and may be life threatening. What are the causes? Almost all URIs are caused by viruses. A virus is a type of germ and can spread from one person to another. What increases the risk? You may be at risk for a URI if:  You smoke.  You have chronic heart or lung disease.  You have a weakened defense (immune) system.  You are very young or very old.  You have nasal allergies or asthma.  You work in crowded or poorly ventilated areas.  You work in health care facilities or schools.  What are the signs or symptoms? Symptoms typically develop 2-3 days after you come in contact with a cold virus. Most viral URIs last 7-10 days. However, viral URIs from the influenza virus (flu virus) can last 14-18 days and are typically more severe. Symptoms may include:  Runny or stuffy (congested) nose.  Sneezing.  Cough.  Sore throat.  Headache.  Fatigue.  Fever.  Loss of appetite.  Pain in your forehead, behind your eyes, and over your cheekbones (sinus pain).  Muscle aches.  How is this diagnosed? Your health care provider may diagnose a URI by:  Physical exam.  Tests to check that your  symptoms are not due to another condition such as: ? Strep throat. ? Sinusitis. ? Pneumonia. ? Asthma.  How is this treated? A URI goes away on its own with time. It cannot be cured with medicines, but medicines may be prescribed or recommended to relieve symptoms. Medicines may help:  Reduce your fever.  Reduce your cough.  Relieve nasal congestion.  Follow these instructions at home:  Take medicines only as directed by your health care provider.  Gargle warm saltwater or take cough drops to comfort your throat as directed by your health care provider.  Use a warm mist humidifier or inhale steam from a shower to increase air moisture. This may make it easier to breathe.  Drink enough fluid to keep your urine clear or pale yellow.  Eat soups and other clear broths and maintain good nutrition.  Rest as needed.  Return to work when your temperature has returned to normal or as your health care provider advises. You may need to stay home longer to avoid infecting others. You can also use a face mask and careful hand washing to prevent spread of the virus.  Increase the usage of your inhaler if you have asthma.  Do not use any tobacco products, including cigarettes, chewing tobacco, or electronic cigarettes. If you need help quitting, ask your health care provider. How is this prevented? The best way to protect yourself from getting a cold is to practice good hygiene.  Avoid oral or hand contact with people with cold symptoms.  Wash your   hands often if contact occurs.  There is no clear evidence that vitamin C, vitamin E, echinacea, or exercise reduces the chance of developing a cold. However, it is always recommended to get plenty of rest, exercise, and practice good nutrition. Contact a health care provider if:  You are getting worse rather than better.  Your symptoms are not controlled by medicine.  You have chills.  You have worsening shortness of breath.  You have  brown or red mucus.  You have yellow or brown nasal discharge.  You have pain in your face, especially when you bend forward.  You have a fever.  You have swollen neck glands.  You have pain while swallowing.  You have white areas in the back of your throat. Get help right away if:  You have severe or persistent: ? Headache. ? Ear pain. ? Sinus pain. ? Chest pain.  You have chronic lung disease and any of the following: ? Wheezing. ? Prolonged cough. ? Coughing up blood. ? A change in your usual mucus.  You have a stiff neck.  You have changes in your: ? Vision. ? Hearing. ? Thinking. ? Mood. This information is not intended to replace advice given to you by your health care provider. Make sure you discuss any questions you have with your health care provider. Document Released: 10/27/2000 Document Revised: 01/04/2016 Document Reviewed: 08/08/2013 Elsevier Interactive Patient Education  2018 [employer]  

## 2017-07-19 NOTE — Progress Notes (Signed)
Subjective  CC:  Chief Complaint  Patient presents with  . Sore Throat    x 3 days, fever    HPI: [name] is a 77 y.o. female who presents to the office today to address the problems listed above in the chief complaint.  C/o sore throat, mild URI sxs without fever (tmax 99.8 today). No SOB or GI sxs. No known exposure ot strep or mono. OTC analgesics have been used with minimal or mild relief. Eating and drinking OK.   Has occ mild cough. Having cataract surgery tomorrow.  I reviewed the patients updated PMH, FH, and SocHx.    Patient Active Problem List   Diagnosis Date Noted  . Overweight (BMI 25.0-29.9) 05/27/2017  . Other idiopathic scoliosis, lumbar region 01/07/2017  . Spinal stenosis of lumbar region without neurogenic claudication 10/08/2016  . Chronic fatigue 03/13/2014  . Chronic pain syndrome 01/10/2014  . Lumbar back pain 11/06/2010  . Cervical radiculopathy 06/08/2010  . Osteoporosis 12/04/2008  . 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/17/2008  . HEADACHE 04/17/2008  . URINARY INCONTINENCE 04/17/2008  . Osteoarthritis, multiple sites 02/16/2007  . Hypothyroidism 11/30/2006  . GERD 11/30/2006  . Fibromyalgia 11/30/2006   Current Meds  Medication Sig  . Aspirin-Caffeine (ANACIN PO) Take by mouth.  Marland Kitchen. HYDROcodone-acetaminophen (NORCO) 10-325 MG tablet Take 1 tablet by mouth daily as needed. Rx'ed by Dr. Ethelene Halamos  . Multiple Vitamins-Minerals (OCUVITE ADULT FORMULA) CAPS Take 1 capsule by mouth daily.   . multivitamin (THERAGRAN) per tablet Take 1 tablet by mouth daily.   . SUMAtriptan (IMITREX) 100 MG tablet TAKE 1 TABLET BY MOUTH FOR MIGRAINES  . SYNTHROID 75 MCG tablet Take 1 tablet (75 mcg total) by mouth daily before breakfast.    Allergies: Patient is allergic to terbinafine hcl; prolia [denosumab]; thimerosal; and lamisil  [terbinafine].  Review of Systems: Constitutional: Negative for fever  malaise or anorexia Cardiovascular: negative for chest pain Respiratory: negative for SOB or persistent cough Gastrointestinal: negative for abdominal pain  Objective  Vitals: BP 118/82   Pulse 78   Temp 98.2 F (36.8 C)   Ht 5\' 1"  (1.549 m)   Wt 140 lb 3.2 oz (63.6 kg)   BMI 26.49 kg/m  General: no acute distress , A&Ox3 HEENT: PEERL, conjunctiva normal, bilateral EAC and TMs are normal. Nares normal. Oropharynx moist with erythematous posterior pharynx without exudate, + cervical LAD, 2+ tonsils, midline uvula, neck is supple Cardiovascular:  RRR without murmur or gallop.  Respiratory:  Good breath sounds bilaterally, CTAB with normal respiratory effort Skin:  Warm, no rashes  Office Visit on 07/19/2017  Component Date Value Ref Range Status  . Rapid Strep A Screen 07/19/2017 Negative  Negative Final  . Influenza A, POC 07/19/2017 Negative  Negative Final  . Influenza B, POC 07/19/2017 Negative  Negative Final    Assessment  1. Viral URI   2. Sore throat   3. Fever, unspecified fever cause      Plan   Supportive care with advil, tylenol, gargles etc discussed. Ok for cataract surgery tomorrow. RTO if sxs persist or worsen.   Follow up: prn    Commons side effects, risks, benefits, and alternatives for medications and treatment plan prescribed today were discussed, and the patient expressed understanding of the given instructions. Patient is instructed to call or message via MyChart if he/she has any questions or concerns regarding our treatment plan. No barriers to understanding were identified. We  discussed Red Flag symptoms and signs in detail. Patient expressed understanding regarding what to do in case of urgent or emergency type symptoms.   Medication list was reconciled, printed and provided to the patient in AVS. Patient instructions and summary information was reviewed with the patient as documented in the AVS. This note was prepared with assistance of Dragon  voice recognition software. Occasional wrong-word or sound-a-like substitutions may have occurred due to the inherent limitations of voice recognition software  Orders Placed This Encounter  Procedures  . POCT rapid strep A  . POC Influenza A&B(BINAX/QUICKVUE)   No orders of the defined types were placed in this encounter.

## 2017-07-22 ENCOUNTER — Telehealth: Payer: Self-pay | Admitting: Family Medicine

## 2017-07-22 NOTE — Telephone Encounter (Signed)
LM for patient to return call. CRM created.   PCP can discuss recommendations/ make appt's.

## 2017-07-22 NOTE — Telephone Encounter (Addendum)
Contacted pt's husband because she has white spots on her throat around her tonsils and has a low grade temperature 98.99; pt she was seen in office 07/19/17; [facility]; she request that this information be routed to office for provider review; pt's husband Casimiro NeedleMichael ca be contacted at [phone] and their pharmacy is [facility] [address] [place], [place]

## 2017-07-22 NOTE — Telephone Encounter (Signed)
This is consistent with her infection. Nothing different needs to be done. Continue supportive care with salt water gargles, advil or tylenol, lozenges. Should improve over the next several days.

## 2017-07-24 ENCOUNTER — Encounter: Payer: Self-pay | Admitting: Family Medicine

## 2017-07-26 NOTE — Telephone Encounter (Signed)
LM for Patient to Call Back .   AP, LPN

## 2017-07-26 NOTE — Telephone Encounter (Signed)
Spoke with husband, patient seems to be doing better, went to ER. Patient still has cough, but has improved otherwise   AP, LPN

## 2017-08-01 ENCOUNTER — Other Ambulatory Visit: Payer: Self-pay | Admitting: Family Medicine

## 2017-08-01 NOTE — Telephone Encounter (Signed)
I will need more information before I am able to fill this.  She was getting monthly until May, and then not again until August, and then not again until November, and now mid-March she is requesting a refill.  I don't understand how she is taking this and what it is for.

## 2017-08-01 NOTE — Telephone Encounter (Signed)
Last OV 05/27/17 vyvanse last filled 03/24/17 #30 with 0 listed as an error from Dr. Milinda CaveMcGowen

## 2017-08-01 NOTE — Telephone Encounter (Signed)
Spoke with pt and her husband.  Pt husband states that due to energy levels with her fibromyalgia she was started on this medication. During her fibromyalgia flares she has little focus.   Pt had quit this because she had felt that she did not want to be on this. That is why it had not been filled on a regular. Pt stated that if you begin this medication again, she will not quit it until you tell her she should not be on this.

## 2017-08-02 NOTE — Telephone Encounter (Addendum)
Spoke with pt husband. He is ok with the 20mg  dose as they will defer to what the PCP says. I advised that it may not be filled until tomorrow due to PCP appt. They are ok with this. Medication pended.

## 2017-08-02 NOTE — Telephone Encounter (Signed)
Since this is an off label use, and she is now [age], I would be more comfortable starting her Vyvanse at 20mg  daily rather than 40.  I will decline the 40mg  dose but we can pend a 20mg  dose for signature

## 2017-08-02 NOTE — Addendum Note (Signed)
Addended by: Geannie RisenBRODMERKEL, JESSICA L on: 08/02/2017 11:46 AM   Modules accepted: Orders

## 2017-08-04 MED ORDER — LISDEXAMFETAMINE DIMESYLATE 20 MG PO CAPS: 20.0000 mg | ORAL_CAPSULE | Freq: Every day | ORAL | 0 refills | Status: DC

## 2017-08-04 NOTE — Addendum Note (Signed)
Addended by: Sheliah HatchABORI, Wrenn Willcox E on: 08/04/2017 08:03 AM   Modules accepted: Orders

## 2017-08-04 NOTE — Telephone Encounter (Signed)
Will start 20mg  daily and pt needs to schedule an appt in 3-4 weeks to recheck BP, HR, and mood

## 2017-08-19 ENCOUNTER — Other Ambulatory Visit: Payer: Self-pay | Admitting: Family Medicine

## 2017-08-24 ENCOUNTER — Other Ambulatory Visit: Payer: Self-pay | Admitting: Family Medicine

## 2017-08-24 NOTE — Telephone Encounter (Signed)
Last OV: 05/27/17 Fill Date: 08/04/17

## 2017-09-16 ENCOUNTER — Ambulatory Visit: Payer: Medicare Other | Admitting: Family Medicine

## 2017-09-17 ENCOUNTER — Other Ambulatory Visit: Payer: Self-pay | Admitting: Family Medicine

## 2017-09-30 ENCOUNTER — Other Ambulatory Visit: Payer: Self-pay | Admitting: Family Medicine

## 2017-09-30 NOTE — Telephone Encounter (Signed)
Last OV 07/19/17 ( Viral URI), Next OV 11/23/17  Last filled 08/24/17, # 30 with 0 refills

## 2017-10-24 ENCOUNTER — Ambulatory Visit: Payer: Medicare Other

## 2017-10-29 ENCOUNTER — Other Ambulatory Visit: Payer: Self-pay | Admitting: Family Medicine

## 2017-10-31 MED ORDER — LISDEXAMFETAMINE DIMESYLATE 20 MG PO CAPS: 20.0000 mg | ORAL_CAPSULE | Freq: Every day | ORAL | 0 refills | Status: DC

## 2017-10-31 NOTE — Telephone Encounter (Signed)
Last OV 07/19/17 vyvanse last filled 09/30/17 #30 with 0

## 2017-11-18 ENCOUNTER — Other Ambulatory Visit: Payer: Self-pay | Admitting: Family Medicine

## 2017-11-21 NOTE — Telephone Encounter (Signed)
Last OV: 05/31/2017 Last Fill: 10/31/2017

## 2017-11-22 NOTE — Progress Notes (Addendum)
Subjective:   [name] is a 77 y.o. female who presents for Medicare Annual (Subsequent) preventive examination.  Review of Systems:  No ROS.  Medicare Wellness Visit. Additional risk factors are reflected in the social history.  Cardiac Risk Factors include: advanced age (>53men, >52 women)   Sleep patterns: Sleeps 8-10 hours, up to void x 1.  Home Safety/Smoke Alarms: Feels safe in home. Smoke alarms in place.  Living environment; residence and Firearm Safety: Lives with husband in 1 story home with basement, rail at stairs. Seat Belt Safety/Bike Helmet: Wears seat belt.   Female:   Pap-N/A. Followed by [facility]       Mammo-10/03/2014, benign. Defers testing.  Dexa scan-12/31/2010, Osteoporosis. Defers testing.        CCS-Colonoscopy 01/27/2004, normal.      Objective:     Vitals: BP 126/70 (BP Location: Left Arm, Patient Position: Sitting, Cuff Size: Normal)   Pulse 83   Temp 98.2 F (36.8 C) (Temporal)   Resp 16   Ht 5\' 1"  (1.549 m)   Wt 125 lb (56.7 kg)   SpO2 99%   BMI 23.62 kg/m   Body mass index is 23.62 kg/m.  Advanced Directives 11/23/2017 01/07/2017 10/18/2016 08/30/2016  Does Patient Have a Medical Advance Directive? Yes Yes Yes Yes  Type of Advance Directive Living will;Healthcare Power of [employer] Power of [place];Living will Healthcare Power of [place];Living will Healthcare Power of [place];Living will  Copy of Healthcare Power of Attorney in Chart? No - copy requested No - copy requested No - copy requested -    Tobacco Social History   Tobacco Use  Smoking Status Never Smoker  Smokeless Tobacco Never Used     Counseling given: Not Answered   Past Medical History:  Diagnosis Date  . Allergic rhinitis due to pollen 11/30/2006   Skin test 10/15/09. Retest 10/30/10 Allergy vaccine d/c'd 09/2012 Significant component of irritant/ vasomotor rhinitis    . Chronic fatigue   . 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 Degenerative disk disease    (02/2011 CT scans).  Cervical (MRI 2001 & 2003).  Hx of thoracic epidural injections via Dr. Noel Gerold.  +Lumbar.  . Elevated blood pressure reading without diagnosis of hypertension    ? White coat HTN?   All home bp's normal.  . Fibromyalgia   . GERD (gastroesophageal reflux disease)   . Hypothyroidism   . Insomnia    Ambien occasionally  . Migraine syndrome   . Osteoarthritis   . Osteoporosis    Bisphosphonates no help.  Prolia caused side effects.  . Sacroiliac joint dysfunction   . Scoliosis deformity of spine    Lumbar  . Spinal stenosis of lumbar region with radiculopathy    + spondylolisthesis.  Dr. Wynn Banker with physical med/rehab did eval 08/2016 and did trial of tramadol instead of hydrocodone--he will be the MD to prescribe her pain meds in the future.  . Urinary incontinence    Past Surgical History:  Procedure Laterality Date  . BLADDER SUSPENSION  1983  . Bone density  01/2009; 12/2010   T -2.8 to -3.0  . CATARACT EXTRACTION, BILATERAL    . CHOLECYSTECTOMY  2009  . COLONOSCOPY  01/27/2004   diverticulosis  . ESOPHAGOGASTRODUODENOSCOPY ENDOSCOPY  1983; 2005   GERD, distal esoph stricture, no Barrett's esoph  . RECTOCELE REPAIR    . ROTATOR CUFF REPAIR  2011  Right  . TOTAL KNEE ARTHROPLASTY  2008/2011   both knees  . VAGINAL HYSTERECTOMY  1983   Ovaries still in.  Nonmalignant reason.   Family History  Problem Relation Age of Onset  . Dementia Mother   . Lung cancer Father   . Irritable bowel syndrome Unknown   . Colon cancer Neg Hx   . Breast cancer Neg Hx    Social History   Socioeconomic History  . Marital status: Married    Spouse name: Not on file  . Number of children: Not on file  . Years of education: Not on file  . Highest education level: Not on file  Occupational History  . Occupation: retired    Associate Professor: RETIRED  Social Needs  . Financial resource strain: Not on  file  . Food insecurity:    Worry: Not on file    Inability: Not on file  . Transportation needs:    Medical: Not on file    Non-medical: Not on file  Tobacco Use  . Smoking status: Never Smoker  . Smokeless tobacco: Never Used  Substance and Sexual Activity  . Alcohol use: No  . Drug use: No  . Sexual activity: Yes  Lifestyle  . Physical activity:    Days per week: Not on file    Minutes per session: Not on file  . Stress: Not on file  Relationships  . Social connections:    Talks on phone: Not on file    Gets together: Not on file    Attends religious service: Not on file    Active member of club or organization: Not on file    Attends meetings of clubs or organizations: Not on file    Relationship status: Not on file  Other Topics Concern  . Not on file  Social History Narrative   Married 53  Years, has 2 sons.   Orig from [place].   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   No T/A/Ds.   Tries to eat carb-conscious diet.       Outpatient Encounter Medications as of 11/23/2017  Medication Sig  . Aspirin-Caffeine (ANACIN PO) Take by mouth.  . dorzolamidel-timolol (COSOPT) 22.3-6.8 MG/ML SOLN ophthalmic solution   . folic acid (FOLVITE) 800 MCG tablet folic acid 800 mcg tablet   1 tablet every day by oral route.  Marland Kitchen HYDROcodone-acetaminophen (NORCO) 10-325 MG tablet Take 1 tablet by mouth daily as needed. Rx'ed by Dr. Ethelene Hal  . Lysine 500 MG CAPS lysine 500 mg tablet   1 tablet every day by oral route.  . Multiple Vitamins-Minerals (OCUVITE ADULT FORMULA) CAPS Take 1 capsule by mouth daily.   . multivitamin (THERAGRAN) per tablet Take 1 tablet by mouth daily.   . Potassium 99 MG TABS potassium 99 mg tablet   1 tablet every day by oral route.  . SUMAtriptan (IMITREX) 100 MG tablet TAKE 1 TABLET BY MOUTH FOR MIGRAINES  . SYNTHROID 75 MCG tablet Take 1 tablet (75 mcg total) by mouth daily before breakfast.  . vitamin E 400 UNIT capsule daily.  Marland Kitchen  VYVANSE 20 MG capsule Take 1 capsule (20 mg total) by mouth daily.  . bisacodyl (DULCOLAX) 5 MG EC tablet 5 mg as needed.   No facility-administered encounter medications on file as of 11/23/2017.     Activities of Daily Living In your present state of health, do you have any difficulty performing the following activities: 11/23/2017 11/23/2017  Hearing? Malvin Johns  Comment - -  Vision? N N  Comment - -  Difficulty concentrating or making decisions? Malvin Johns  Walking or climbing stairs? N N  Dressing or bathing? N N  Doing errands, shopping? N N  Preparing Food and eating ? - N  Using the Toilet? - N  In the past six months, have you accidently leaked urine? - N  Do you have problems with loss of bowel control? - N  Managing your Medications? - N  Managing your Finances? - N  Housekeeping or managing your Housekeeping? - N  Some recent data might be hidden    Patient Care Team: Sheliah Hatch, MD as PCP - General (Family Medicine) Durene Romans, MD as Consulting Physician (Orthopedic Surgery) Sheran Luz, MD as Consulting Physician (Physical Medicine and Rehabilitation) Carrington Clamp, MD as Consulting Physician (Obstetrics and Gynecology) Cherlyn Roberts, MD as Consulting Physician (Dermatology)    Assessment:   This is a routine wellness examination for [place].  Exercise Activities and Dietary recommendations Current Exercise Habits: The patient does not participate in regular exercise at present(Works out in yard; maintains house), Exercise limited by: orthopedic condition(s)   Diet (meal preparation, eat out, water intake, caffeinated beverages, dairy products, fruits and vegetables): Drinks water.  Breakfast: skips; coffee Lunch: Ensure; milk; yogurt Dinner: grapes and cheese      Discussed increasing caloric/protein intake and not skipping meals.   Goals    . Patient Stated     Increase protein intake.        Fall Risk Fall Risk  11/23/2017 11/23/2017  05/27/2017 01/07/2017 10/18/2016  Falls in the past year? No No No No No   Depression Screen PHQ 2/9 Scores 11/23/2017 11/23/2017 05/27/2017 10/18/2016  PHQ - 2 Score 0 0 0 0  PHQ- 9 Score 0 - 0 -     Cognitive Function MMSE - Mini Mental State Exam 11/23/2017  Orientation to time 4  Orientation to Place 3  Registration 3  Attention/ Calculation 4  Recall 0  Language- name 2 objects 2  Language- repeat 1  Language- follow 3 step command 3  Language- read & follow direction 1  Write a sentence 1  Copy design 1  Total score 23        Immunization History  Administered Date(s) Administered  . Influenza Split 02/10/2011, 02/01/2012  . Influenza Whole 05/17/2005, 02/15/2008, 03/04/2009, 02/12/2010  . Influenza, High Dose Seasonal PF 03/05/2013, 05/07/2015, 04/20/2017  . Influenza,inj,Quad PF,6+ Mos 01/10/2014  . Influenza,inj,quad, With Preservative 02/14/2017  . Influenza-Unspecified 01/15/2014  . Pneumococcal Conjugate-13 09/02/2014  . Pneumococcal Polysaccharide-23 05/17/2005, 04/20/2017  . Td 05/17/2005  . Zoster 05/16/2009, 04/16/2012    Screening Tests Health Maintenance  Topic Date Due  . MAMMOGRAM  11/24/2018 (Originally 10/03/2015)  . TETANUS/TDAP  11/24/2018 (Originally 05/18/2015)  . INFLUENZA VACCINE  12/15/2017  . DEXA SCAN  Completed  . PNA vac Low Risk Adult  Completed        Plan:    Bring a copy of your living will and/or healthcare power of attorney to your next office visit.  Continue doing brain stimulating activities (puzzles, reading, adult coloring books, staying active) to keep memory sharp.   I have personally reviewed and noted the following in the patient's chart:   . Medical and social history . Use of alcohol, tobacco or illicit drugs  . Current medications and supplements . Functional ability and status . Nutritional status . Physical activity . Advanced directives . List of other  physicians . Hospitalizations, surgeries, and ER visits  in previous 12 months . Vitals . Screenings to include cognitive, depression, and falls . Referrals and appointments  In addition, I have reviewed and discussed with patient certain preventive protocols, quality metrics, and best practice recommendations. A written personalized care plan for preventive services as well as general preventive health recommendations were provided to patient.     Alysia PennaKimberly R Denario Bagot, RN  11/23/2017  Reviewed documentation provided by RN and agree w/ above.  Neena RhymesKatherine Tabori, MD

## 2017-11-23 ENCOUNTER — Other Ambulatory Visit: Payer: Self-pay

## 2017-11-23 ENCOUNTER — Ambulatory Visit (INDEPENDENT_AMBULATORY_CARE_PROVIDER_SITE_OTHER): Payer: Medicare Other

## 2017-11-23 ENCOUNTER — Encounter: Payer: Self-pay | Admitting: Family Medicine

## 2017-11-23 ENCOUNTER — Ambulatory Visit (INDEPENDENT_AMBULATORY_CARE_PROVIDER_SITE_OTHER): Payer: Medicare Other | Admitting: Family Medicine

## 2017-11-23 VITALS — BP 126/70 | HR 83 | Temp 98.2°F | Resp 16 | Ht 61.0 in | Wt 125.0 lb

## 2017-11-23 DIAGNOSIS — M810 Age-related osteoporosis without current pathological fracture: Secondary | ICD-10-CM | POA: Diagnosis not present

## 2017-11-23 DIAGNOSIS — R5383 Other fatigue: Secondary | ICD-10-CM

## 2017-11-23 DIAGNOSIS — E039 Hypothyroidism, unspecified: Secondary | ICD-10-CM

## 2017-11-23 DIAGNOSIS — Z0000 Encounter for general adult medical examination without abnormal findings: Secondary | ICD-10-CM

## 2017-11-23 DIAGNOSIS — R413 Other amnesia: Secondary | ICD-10-CM | POA: Insufficient documentation

## 2017-11-23 DIAGNOSIS — R5382 Chronic fatigue, unspecified: Secondary | ICD-10-CM | POA: Diagnosis not present

## 2017-11-23 NOTE — Patient Instructions (Addendum)

## 2017-11-23 NOTE — Progress Notes (Signed)
   Subjective:    Patient ID: Madison MartyrSharon R Hendel, female    DOB: 07/29/1940, 77 y.o.   MRN: [id number]  HPI Hypothyroid- chronic problem, on Synthroid 75mcg daily.  Denies changes to skin/hair/nails.  Fatigue- 'tired all the time'  Pt reports sleeping well.  No regular exercise/activity due to chronic pain.  Pt reports limited protein intake.  No CP, SOB, HAs above baseline, edema above baseline  Memory loss- husband notes an issue over the last year.  Pt scored 23/30 on MMSE.  Pt admits to no longer doing brain stimulation since her cousin died (they did these things together)  Difficulty remembering dates.  Mother and grandmother both had memory issues.  Pt has concerns about this.  Pt is not interested in medication.   Review of Systems For ROS see HPI     Objective:   Physical Exam  Constitutional: She appears well-developed and well-nourished. No distress.  HENT:  Head: Normocephalic and atraumatic.  Eyes: Pupils are equal, round, and reactive to light. Conjunctivae and EOM are normal.  Neck: Normal range of motion. Neck supple. No thyromegaly present.  Cardiovascular: Normal rate, regular rhythm, normal heart sounds and intact distal pulses.  No murmur heard. Pulmonary/Chest: Effort normal and breath sounds normal. No respiratory distress.  Abdominal: Soft. She exhibits no distension. There is no tenderness.  Musculoskeletal: She exhibits no edema.  Lymphadenopathy:    She has no cervical adenopathy.  Neurological: She is alert.  Skin: Skin is warm and dry.  Psychiatric: She has a normal mood and affect. Her behavior is normal.  Vitals reviewed.         Assessment & Plan:

## 2017-11-23 NOTE — Patient Instructions (Signed)
Follow up in 3 months to recheck memory We'll notify you of your lab results and make any changes if needed Make sure you are eating regularly- your body won't function without fuel Restart your brain exercises daily Consider a medication to help w/ memory Call with any questions or concerns Have a great summer!!

## 2017-11-24 ENCOUNTER — Encounter: Payer: Self-pay | Admitting: General Practice

## 2017-11-24 LAB — CBC WITH DIFFERENTIAL/PLATELET
BASOS ABS: 0.1 10*3/uL (ref 0.0–0.1)
Basophils Relative: 1.3 % (ref 0.0–3.0)
Eosinophils Absolute: 0.2 10*3/uL (ref 0.0–0.7)
Eosinophils Relative: 5 % (ref 0.0–5.0)
HCT: 39.2 % (ref 36.0–46.0)
HEMOGLOBIN: 13.3 g/dL (ref 12.0–15.0)
LYMPHS ABS: 2.2 10*3/uL (ref 0.7–4.0)
LYMPHS PCT: 47.4 % — AB (ref 12.0–46.0)
MCHC: 34 g/dL (ref 30.0–36.0)
MCV: 93.9 fl (ref 78.0–100.0)
MONOS PCT: 9.5 % (ref 3.0–12.0)
Monocytes Absolute: 0.4 10*3/uL (ref 0.1–1.0)
NEUTROS PCT: 36.8 % — AB (ref 43.0–77.0)
Neutro Abs: 1.7 10*3/uL (ref 1.4–7.7)
Platelets: 180 10*3/uL (ref 150.0–400.0)
RBC: 4.18 Mil/uL (ref 3.87–5.11)
RDW: 13.8 % (ref 11.5–15.5)
WBC: 4.7 10*3/uL (ref 4.0–10.5)

## 2017-11-24 LAB — BASIC METABOLIC PANEL
BUN: 19 mg/dL (ref 6–23)
CO2: 30 mEq/L (ref 19–32)
CREATININE: 0.63 mg/dL (ref 0.40–1.20)
Calcium: 9.5 mg/dL (ref 8.4–10.5)
Chloride: 104 mEq/L (ref 96–112)
GFR: 97.18 mL/min (ref >60.00)
Glucose, Bld: 115 mg/dL — ABNORMAL HIGH (ref 70–99)
POTASSIUM: 4.7 meq/L (ref 3.5–5.1)
Sodium: 141 mEq/L (ref 135–145)

## 2017-11-24 LAB — HEPATIC FUNCTION PANEL
ALBUMIN: 4.2 g/dL (ref 3.5–5.2)
ALK PHOS: 94 U/L (ref 39–117)
ALT: 20 U/L (ref 0–35)
AST: 24 U/L (ref 0–37)
BILIRUBIN TOTAL: 0.5 mg/dL (ref 0.2–1.2)
Bilirubin, Direct: 0.1 mg/dL (ref 0.0–0.3)
Total Protein: 6.2 g/dL (ref 6.0–8.3)

## 2017-11-24 LAB — LIPID PANEL
CHOL/HDL RATIO: 3
Cholesterol: 221 mg/dL — ABNORMAL HIGH (ref 0–200)
HDL: 74.4 mg/dL (ref >39.00)
LDL CALC: 128 mg/dL — AB (ref 0–99)
NONHDL: 147.08
Triglycerides: 97 mg/dL (ref 0.0–149.0)
VLDL: 19.4 mg/dL (ref 0.0–40.0)

## 2017-11-24 LAB — VITAMIN D 25 HYDROXY (VIT D DEFICIENCY, FRACTURES): VITD: 34.01 ng/mL (ref 30.00–100.00)

## 2017-11-24 LAB — TSH: TSH: 0.77 u[IU]/mL (ref 0.35–4.50)

## 2017-11-25 NOTE — Assessment & Plan Note (Signed)
Chronic problem.  On Synthroid daily.  Check labs.  Adjust meds prn

## 2017-11-25 NOTE — Assessment & Plan Note (Signed)
New.  Pt scored 23/30 on MMSE.  Husband is concerned and would prefer she start medication.  Pt states she is concerned but not willing to start medication.  Offered formal neuropsych testing- they declined. Stressed need for brain stimulation.  Will continue to follow.

## 2017-11-25 NOTE — Assessment & Plan Note (Signed)
Check Vit D and replete prn. 

## 2017-11-25 NOTE — Assessment & Plan Note (Signed)
Pt is again complaining of fatigue.  Sleeping well.  No regular exercise due to chronic pain.  Limited protein intake and not always eating regularly.  Stressed the need for proper nutrition to improve her energy levels.  Check labs to r/o metabolic cause.  Discussed possible mood disorder- pt doesn't feel this is the case.  Will continue to follow.

## 2017-12-09 ENCOUNTER — Encounter: Payer: Self-pay | Admitting: Family Medicine

## 2017-12-24 ENCOUNTER — Other Ambulatory Visit: Payer: Self-pay | Admitting: Family Medicine

## 2017-12-26 NOTE — Telephone Encounter (Signed)
Last OV 11/23/17 vyvanse last filled 11/22/17 #30 with 0

## 2018-01-04 ENCOUNTER — Ambulatory Visit (INDEPENDENT_AMBULATORY_CARE_PROVIDER_SITE_OTHER): Payer: Medicare Other | Admitting: Family Medicine

## 2018-01-04 ENCOUNTER — Encounter: Payer: Self-pay | Admitting: Family Medicine

## 2018-01-04 ENCOUNTER — Other Ambulatory Visit: Payer: Self-pay

## 2018-01-04 VITALS — BP 102/70 | HR 81 | Temp 98.1°F | Resp 15 | Ht 61.0 in | Wt 124.6 lb

## 2018-01-04 DIAGNOSIS — R413 Other amnesia: Secondary | ICD-10-CM | POA: Diagnosis not present

## 2018-01-04 MED ORDER — DONEPEZIL HCL 5 MG PO TABS: 5.0000 mg | ORAL_TABLET | Freq: Every day | ORAL | 3 refills | Status: DC

## 2018-01-04 NOTE — Progress Notes (Signed)
   Subjective:    Patient ID: Madison MartyrSharon R Voth, female    DOB: 08/20/1940, 77 y.o.   MRN: [id number]  HPI Memory loss- pt scored a 23/30 on MMSE on 11/25/17.  At that time, medication was discussed w/ both pt and husband.  Pt's husband wanted her to start, pt declined.  Mother died from dementia and pt is fearful of what lies ahead.  They have talked about the medication and also talked to their pharmacist- which helped them put things in perspective.   Review of Systems For ROS see HPI     Objective:   Physical Exam  Constitutional: She is oriented to person, place, and time. She appears well-developed and well-nourished. No distress.  HENT:  Head: Normocephalic and atraumatic.  Neurological: She is alert and oriented to person, place, and time. She displays normal reflexes. No cranial nerve deficit. Coordination normal.  Skin: Skin is warm and dry.  Psychiatric: She has a normal mood and affect. Her behavior is normal. Thought content normal.  Vitals reviewed.         Assessment & Plan:

## 2018-01-04 NOTE — Patient Instructions (Signed)
Follow up as scheduled for October START the Donepezil (Aricept) once daily STOP the Vyvanse Call with any questions or concerns Happy Early [place] OuchBirthday!!!

## 2018-01-04 NOTE — Assessment & Plan Note (Signed)
Ongoing issue.  After taking some time to think about medication, pt is now on board.  'we have to do something'.  Start Aricept 5mg  daily and titrate if needed at next appt.  Pt expressed understanding and is in agreement w/ plan.

## 2018-01-18 ENCOUNTER — Encounter: Payer: Self-pay | Admitting: Family Medicine

## 2018-02-23 ENCOUNTER — Encounter: Payer: Self-pay | Admitting: Family Medicine

## 2018-02-23 ENCOUNTER — Other Ambulatory Visit: Payer: Self-pay

## 2018-02-23 ENCOUNTER — Ambulatory Visit (INDEPENDENT_AMBULATORY_CARE_PROVIDER_SITE_OTHER): Payer: Medicare Other | Admitting: Family Medicine

## 2018-02-23 VITALS — BP 112/78 | HR 85 | Temp 97.8°F | Resp 14 | Ht 61.0 in | Wt 124.0 lb

## 2018-02-23 DIAGNOSIS — R5382 Chronic fatigue, unspecified: Secondary | ICD-10-CM | POA: Diagnosis not present

## 2018-02-23 DIAGNOSIS — R413 Other amnesia: Secondary | ICD-10-CM

## 2018-02-23 MED ORDER — LISDEXAMFETAMINE DIMESYLATE 20 MG PO CAPS: 20.0000 mg | ORAL_CAPSULE | Freq: Every day | ORAL | 0 refills | Status: DC

## 2018-02-23 NOTE — Patient Instructions (Signed)
Follow up in 6-8 weeks to recheck energy level Restart Vyvanse once daily Continue to do your brain puzzles and be as active as possible Call with any questions or concerns Happy Fall!

## 2018-02-23 NOTE — Assessment & Plan Note (Signed)
Ongoing.  Had a lengthy discussion w/ pt and husband that without the medication her memory loss may deteriorate over time.  Pt and husband are aware of this as multiple family members have succumbed to dementia.  She has living will in place and is very clear that she wants quality of life over quantity.  She felt the Aricept's side effects were too much to deal with.  She does not want medication at this time.  I am agreeable to this and husband can accept this.

## 2018-02-23 NOTE — Assessment & Plan Note (Signed)
Ongoing for pt.  She feels she was functioning better when she was on the Vyvanse.  Will restart Vyvanse and monitor for improved energy level.

## 2018-02-23 NOTE — Progress Notes (Signed)
   Subjective:    Patient ID: [name], female    DOB: [birth year], 77 y.o.   MRN: [id number]  HPI Memory loss- pt was started on Aricept 5mg  at last visit.  Pt reports she feels much better when she was on the Vyvanse.  'I feel like i'm living on this.  Off it, i'm just tired all the time and lay on the couch'.  Pt stopped the Aricept a few days after starting it due to fatigue and nausea.  At that time, pt was not interested in any memory medication.  Husband feels the issues are still present but is aware that pt doesn't want medication.  Dog has improved the situation recently.   Review of Systems For ROS see HPI     Objective:   Physical Exam  Constitutional: She appears well-developed and well-nourished. No distress.  HENT:  Head: Normocephalic and atraumatic.  Neurological: She is alert. No cranial nerve deficit. Coordination normal.  Oriented to person/place  Skin: Skin is warm and dry.  Psychiatric: She has a normal mood and affect. Her behavior is normal.  Thought process was not always linear during visit  Vitals reviewed.         Assessment & Plan:

## 2018-03-18 ENCOUNTER — Other Ambulatory Visit: Payer: Self-pay | Admitting: Family Medicine

## 2018-03-20 NOTE — Telephone Encounter (Signed)
Last OV 02/23/18 vyvanse last filled 02/23/18 #30 with 0

## 2018-04-10 ENCOUNTER — Other Ambulatory Visit: Payer: Self-pay | Admitting: Physician Assistant

## 2018-04-10 NOTE — Telephone Encounter (Signed)
Vyvanse last rx 03/30/18 #30  Last OV: 02/23/18

## 2018-04-15 ENCOUNTER — Other Ambulatory Visit: Payer: Self-pay | Admitting: Family Medicine

## 2018-04-17 ENCOUNTER — Other Ambulatory Visit: Payer: Self-pay | Admitting: Physician Assistant

## 2018-04-17 NOTE — Telephone Encounter (Signed)
Last rx Vyvanse filled 03/20/18 #30 Last OV10/10/19

## 2018-05-18 ENCOUNTER — Other Ambulatory Visit: Payer: Self-pay | Admitting: Family Medicine

## 2018-05-18 NOTE — Telephone Encounter (Signed)
Last OV 02/23/18 vyvanse last filled 04/17/18 #30 with 0

## 2018-05-26 ENCOUNTER — Telehealth: Payer: Self-pay

## 2018-05-26 DIAGNOSIS — R413 Other amnesia: Secondary | ICD-10-CM

## 2018-05-26 NOTE — Telephone Encounter (Signed)
Ok to refer to neuropsych for testing

## 2018-05-26 NOTE — Telephone Encounter (Signed)
Pt seen on 02/23/18 for memory loss. Ok for referral?

## 2018-05-26 NOTE — Addendum Note (Signed)
Addended by: Geannie RisenBRODMERKEL, Aiken Withem L on: 05/26/2018 04:33 PM   Modules accepted: Orders

## 2018-05-26 NOTE — Telephone Encounter (Signed)
Referral placed and pt family made aware.

## 2018-05-26 NOTE — Telephone Encounter (Signed)
Copied from CRM [id number]. Topic: Referral - Request for Referral >> May 26, 2018  3:38 PM Jolayne Haines L wrote: Has patient seen PCP for this complaint? Yes *If NO, is insurance requiring patient see PCP for this issue before PCP can refer them? Yes Referral for which specialty: Memory Loss Preferred provider/office: Whichever Dr Beverely Low prefers Reason for referral: Neuro Phycology  Patient's husband said if he needs to bring her in for an appt, he will do that.

## 2018-06-01 ENCOUNTER — Encounter: Payer: Self-pay | Admitting: Family Medicine

## 2018-06-01 ENCOUNTER — Ambulatory Visit (INDEPENDENT_AMBULATORY_CARE_PROVIDER_SITE_OTHER): Payer: Medicare Other | Admitting: Family Medicine

## 2018-06-01 ENCOUNTER — Other Ambulatory Visit: Payer: Self-pay

## 2018-06-01 VITALS — BP 128/82 | HR 62 | Temp 98.1°F | Resp 15 | Ht 63.0 in | Wt 127.2 lb

## 2018-06-01 DIAGNOSIS — J302 Other seasonal allergic rhinitis: Secondary | ICD-10-CM | POA: Diagnosis not present

## 2018-06-01 MED ORDER — CETIRIZINE HCL 10 MG PO TABS: 10.0000 mg | ORAL_TABLET | Freq: Every day | ORAL | 11 refills | Status: DC

## 2018-06-01 MED ORDER — FLUTICASONE PROPIONATE 50 MCG/ACT NA SUSP: 2.0000 | Freq: Every day | NASAL | 6 refills | Status: DC

## 2018-06-01 NOTE — Patient Instructions (Signed)
Please follow up if symptoms do not improve or as needed.   Start the medications as ordered.  Get your eyes examined if the eye symptoms are not improving.   Allergic Rhinitis, Adult Allergic rhinitis is an allergic reaction that affects the mucous membrane inside the nose. It causes sneezing, a runny or stuffy nose, and the feeling of mucus going down the back of the throat (postnasal drip). Allergic rhinitis can be mild to severe. There are two types of allergic rhinitis:  Seasonal. This type is also called hay fever. It happens only during certain seasons.  Perennial. This type can happen at any time of the year. What are the causes? This condition happens when the body's defense system (immune system) responds to certain harmless substances called allergens as though they were germs.  Seasonal allergic rhinitis is triggered by pollen, which can come from grasses, trees, and weeds. Perennial allergic rhinitis may be caused by:  House dust mites.  Pet dander.  Mold spores. What are the signs or symptoms? Symptoms of this condition include:  Sneezing.  Runny or stuffy nose (nasal congestion).  Postnasal drip.  Itchy nose.  Tearing of the eyes.  Trouble sleeping.  Daytime sleepiness. How is this diagnosed? This condition may be diagnosed based on:  Your medical history.  A physical exam.  Tests to check for related conditions, such as: ? Asthma. ? Pink eye. ? Ear infection. ? Upper respiratory infection.  Tests to find out which allergens trigger your symptoms. These may include skin or blood tests. How is this treated? There is no cure for this condition, but treatment can help control symptoms. Treatment may include:  Taking medicines that block allergy symptoms, such as antihistamines. Medicine may be given as a shot, nasal spray, or pill.  Avoiding the allergen.  Desensitization. This treatment involves getting ongoing shots until your body becomes less  sensitive to the allergen. This treatment may be done if other treatments do not help.  If taking medicine and avoiding the allergen does not work, new, stronger medicines may be prescribed. Follow these instructions at home:  Find out what you are allergic to. Common allergens include smoke, dust, and pollen.  Avoid the things you are allergic to. These are some things you can do to help avoid allergens: ? Replace carpet with wood, tile, or vinyl flooring. Carpet can trap dander and dust. ? Do not smoke. Do not allow smoking in your home. ? Change your heating and air conditioning filter at least once a month. ? During allergy season:  Keep windows closed as much as possible.  Plan outdoor activities when pollen counts are lowest. This is usually during the evening hours.  When coming indoors, change clothing and shower before sitting on furniture or bedding.  Take over-the-counter and prescription medicines only as told by your health care provider.  Keep all follow-up visits as told by your health care provider. This is important. Contact a health care provider if:  You have a fever.  You develop a persistent cough.  You make whistling sounds when you breathe (you wheeze).  Your symptoms interfere with your normal daily activities. Get help right away if:  You have shortness of breath. Summary  This condition can be managed by taking medicines as directed and avoiding allergens.  Contact your health care provider if you develop a persistent cough or fever.  During allergy season, keep windows closed as much as possible. This information is not intended to replace advice  given to you by your health care provider. Make sure you discuss any questions you have with your health care provider. Document Released: 01/26/2001 Document Revised: 06/10/2016 Document Reviewed: 06/10/2016 Elsevier Interactive Patient Education  2019 [employer].

## 2018-06-01 NOTE — Progress Notes (Signed)
Subjective  CC:  Chief Complaint  Patient presents with  . Allergies    eyes running, nasal drainage and congestion   Same day acute visit; PCP not available. New pt to me. Chart reviewed.   HPI: [name] is a 78 y.o. female who presents to the office today to address the problems listed above in the chief complaint.  78 year old female presents with her husband due to clear rhinitis and watering eyes on and off for the last 3 months.  It is worse after she is outside.  [place] and will get very congested and have a runny nose after being outside for a little bit.  She denies sneezing, red itching eyes.  She does have history of dry eye and glaucoma.  She denies sore throat, cough, colored discharge, sinus pain, fever, chills, ear pressure or malaise. Assessment  1. Seasonal allergic rhinitis, unspecified trigger      Plan   Allergic rhinitis plus minus allergic conjunctivitis versus dry eyes: Education given.  Start allergy treatment with Zyrtec for nasal and eye symptoms and Flonase.  If not improving, recommend eye exam.  If allergies are not under control, return for additional medications.  Reassured.  Follow up: Return if symptoms worsen or fail to improve.  Visit date not found  No orders of the defined types were placed in this encounter.  Meds ordered this encounter  Medications  . fluticasone (FLONASE) 50 MCG/ACT nasal spray    Sig: Place 2 sprays into both nostrils daily.    Dispense:  16 g    Refill:  6  . cetirizine (ZYRTEC) 10 MG tablet    Sig: Take 1 tablet (10 mg total) by mouth daily.    Dispense:  30 tablet    Refill:  11      I reviewed the patients updated PMH, FH, and SocHx.    Patient Active Problem List   Diagnosis Date Noted  . 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 Other idiopathic scoliosis, lumbar region 01/07/2017  . Spinal stenosis of lumbar region without neurogenic claudication 10/08/2016  . Chronic fatigue  03/13/2014  . Chronic pain syndrome 01/10/2014  . Lumbar back pain 11/06/2010  . Cervical radiculopathy 06/08/2010  . Osteoporosis 12/04/2008  . 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/17/2008  . HEADACHE 04/17/2008  . URINARY INCONTINENCE 04/17/2008  . Osteoarthritis, multiple sites 02/16/2007  . Hypothyroidism 11/30/2006  . Fibromyalgia 11/30/2006   Current Meds  Medication Sig  . Aspirin-Caffeine (ANACIN PO) Take by mouth.  . bisacodyl (DULCOLAX) 5 MG EC tablet 5 mg as needed.  . Cholecalciferol (VITAMIN D3) 1000 units CAPS Vitamin D3 1,000 unit (25 mcg) tablet   1 tablet every day by oral route.  . dorzolamidel-timolol (COSOPT) 22.3-6.8 MG/ML SOLN ophthalmic solution   . folic acid (FOLVITE) 800 MCG tablet folic acid 800 mcg tablet   1 tablet every day by oral route.  Marland Kitchen HYDROcodone-acetaminophen (NORCO) 10-325 MG tablet Take 1 tablet by mouth daily as needed. Rx'ed by Dr. Ethelene Hal  . Lysine 500 MG CAPS lysine 500 mg tablet   1 tablet every day by oral route.  . Multiple Vitamins-Minerals (OCUVITE ADULT FORMULA) CAPS Take 1 capsule by mouth daily.   . multivitamin (THERAGRAN) per tablet Take 1 tablet by mouth daily.   . Potassium 99 MG TABS potassium 99 mg tablet   1 tablet every day by oral route.  . SUMAtriptan (IMITREX) 100  MG tablet TAKE 1 TABLET BY MOUTH FOR MIGRAINES  . SYNTHROID 75 MCG tablet Take 1 tablet (75 mcg total) by mouth daily before breakfast.  . vitamin E 400 UNIT capsule daily.  Marland Kitchen. VYVANSE 20 MG capsule Take 1 capsule (20 mg total) by mouth daily.    Allergies: Patient is allergic to terbinafine hcl; prolia [denosumab]; thimerosal; and lamisil  [terbinafine]. Family History: Patient family history includes Dementia in her mother; Irritable bowel syndrome in an other family member; Lung cancer in her father. Social History:  Patient  reports that she has never smoked. She has never used smokeless tobacco. She  reports that she does not drink alcohol or use drugs.  Review of Systems: Constitutional: Negative for fever malaise or anorexia Cardiovascular: negative for chest pain Respiratory: negative for SOB or persistent cough Gastrointestinal: negative for abdominal pain  Objective  Vitals: BP 128/82   Pulse 62   Temp 98.1 F (36.7 C) (Oral)   Resp 15   Ht 5\' 3"  (1.6 m)   Wt 127 lb 3.2 oz (57.7 kg)   SpO2 93%   BMI 22.53 kg/m  General: no acute distress , A&Ox3 HEENT: PEERL, conjunctiva normal without injection, Oropharynx moist,neck is supple, nasal mucosa is mildly inflamed with clear rhinorrhea, no sinus tenderness, supple neck without lymphadenopathy Cardiovascular:  RRR without murmur or gallop.  Respiratory:  Good breath sounds bilaterally, CTAB with normal respiratory effort Skin:  Warm, no rashes     Commons side effects, risks, benefits, and alternatives for medications and treatment plan prescribed today were discussed, and the patient expressed understanding of the given instructions. Patient is instructed to call or message via MyChart if he/she has any questions or concerns regarding our treatment plan. No barriers to understanding were identified. We discussed Red Flag symptoms and signs in detail. Patient expressed understanding regarding what to do in case of urgent or emergency type symptoms.   Medication list was reconciled, printed and provided to the patient in AVS. Patient instructions and summary information was reviewed with the patient as documented in the AVS. This note was prepared with assistance of Dragon voice recognition software. Occasional wrong-word or sound-a-like substitutions may have occurred due to the inherent limitations of voice recognition software

## 2018-06-09 ENCOUNTER — Encounter: Payer: Self-pay | Admitting: Family Medicine

## 2018-06-15 ENCOUNTER — Other Ambulatory Visit: Payer: Self-pay | Admitting: Family Medicine

## 2018-06-19 ENCOUNTER — Encounter: Payer: Self-pay | Admitting: Family Medicine

## 2018-06-19 ENCOUNTER — Ambulatory Visit (INDEPENDENT_AMBULATORY_CARE_PROVIDER_SITE_OTHER): Payer: Medicare Other | Admitting: Family Medicine

## 2018-06-19 ENCOUNTER — Other Ambulatory Visit: Payer: Self-pay

## 2018-06-19 VITALS — BP 121/81 | HR 83 | Temp 98.9°F | Resp 18 | Ht 63.0 in | Wt 125.2 lb

## 2018-06-19 DIAGNOSIS — R51 Headache: Secondary | ICD-10-CM | POA: Diagnosis not present

## 2018-06-19 DIAGNOSIS — H532 Diplopia: Secondary | ICD-10-CM

## 2018-06-19 DIAGNOSIS — G8929 Other chronic pain: Secondary | ICD-10-CM

## 2018-06-19 DIAGNOSIS — R413 Other amnesia: Secondary | ICD-10-CM | POA: Diagnosis not present

## 2018-06-19 DIAGNOSIS — R5382 Chronic fatigue, unspecified: Secondary | ICD-10-CM

## 2018-06-19 MED ORDER — LISDEXAMFETAMINE DIMESYLATE 20 MG PO CAPS: ORAL_CAPSULE | ORAL | 0 refills | Status: DC

## 2018-06-19 MED ORDER — SUMATRIPTAN SUCCINATE 100 MG PO TABS: ORAL_TABLET | ORAL | 12 refills | Status: DC

## 2018-06-19 NOTE — Patient Instructions (Addendum)
We'll notify you of your lab results and make any changes if needed (this is necessary for MRI) CALL your eye doctor and schedule an appt for eye exam ASAP to assess double vision Tylenol for headache, heating pad as needed for neck pain/tightness We'll call you about the MRI and let you know either way If headache worsens or changes, we need to see Neurology If symptoms change/worsen, please go to ER Call with any questions or concerns Hang in there!

## 2018-06-19 NOTE — Progress Notes (Signed)
   Subjective:    Patient ID: [name], female    DOB: 01-14-1941, 78 y.o.   MRN: [id number]  HPI Double vision- pt had eye surgery 'several months ago' (cataracts) and has been having double vision in R eye intermittently.  Happening at least daily.  Things at a distance seem to be double rather than up close.    HAs- ongoing issue for pt.  Pain is frequently occipital and she complains of 'shooting pain' behind her R eye.  Was dx'd w/ allergic rhinitis at last visit and started on Zyrtec and Flonase w/o relief.  Some relief w/ migraine medication.  Pt and husband are refusing neuro referral.  Chronic fatigue- pt would like to continue her Vyvanse.   Review of Systems For ROS see HPI     Objective:   Physical Exam Vitals signs reviewed.  Constitutional:      General: She is not in acute distress.    Appearance: She is well-developed.  HENT:     Head: Normocephalic and atraumatic.  Eyes:     Extraocular Movements: Extraocular movements intact.     Pupils: Pupils are equal, round, and reactive to light.  Neck:     Musculoskeletal: Normal range of motion and neck supple.  Cardiovascular:     Rate and Rhythm: Normal rate and regular rhythm.  Pulmonary:     Effort: Pulmonary effort is normal.     Breath sounds: Normal breath sounds.  Lymphadenopathy:     Cervical: No cervical adenopathy.  Neurological:     Mental Status: She is alert.     Cranial Nerves: No cranial nerve deficit, dysarthria or facial asymmetry.     Motor: No weakness.     Coordination: Coordination normal.     Gait: Gait normal.  Psychiatric:        Mood and Affect: Mood normal.        Speech: Speech normal.        Behavior: Behavior normal.           Assessment & Plan:  Double vision- new to provider, ongoing for pt.  Stressed need for repeat eye exam.  Given HA and double vision, will get MRI to assess.  Again discussed neurology referral and again they refused.  HA- ongoing issue for pt.   This doesn't sound consistent w/ migraine.  Possibly some component of tension HA.  Again discussed neuro- they refused.  Proceed w/ MRI.  Pt expressed understanding and is in agreement w/ plan.

## 2018-06-20 LAB — BASIC METABOLIC PANEL
BUN: 22 mg/dL (ref 6–23)
CO2: 26 mEq/L (ref 19–32)
Calcium: 9.8 mg/dL (ref 8.4–10.5)
Chloride: 103 mEq/L (ref 96–112)
Creatinine, Ser: 0.57 mg/dL (ref 0.40–1.20)
GFR: 102.47 mL/min (ref >60.00)
GLUCOSE: 99 mg/dL (ref 70–99)
POTASSIUM: 4.6 meq/L (ref 3.5–5.1)
Sodium: 140 mEq/L (ref 135–145)

## 2018-06-20 NOTE — Assessment & Plan Note (Signed)
Ongoing issue.  Refill on Vyvanse provided.

## 2018-06-20 NOTE — Assessment & Plan Note (Signed)
Ongoing issue.  Pt and husband again refusing Neuro.  Not interested in Aricept or Namenda at this time.  Will follow.

## 2018-06-23 ENCOUNTER — Ambulatory Visit
Admission: RE | Admit: 2018-06-23 | Discharge: 2018-06-23 | Disposition: A | Discharge status: Home / Self Care | Payer: Medicare Other | Source: Ambulatory Visit | Attending: Family Medicine | Admitting: Family Medicine

## 2018-06-23 DIAGNOSIS — H532 Diplopia: Secondary | ICD-10-CM

## 2018-06-23 DIAGNOSIS — R51 Headache: Secondary | ICD-10-CM

## 2018-06-23 DIAGNOSIS — R519 Headache, unspecified: Secondary | ICD-10-CM

## 2018-06-23 MED ORDER — GADOBENATE DIMEGLUMINE 529 MG/ML IV SOLN
10.0000 mL | Freq: Once | INTRAVENOUS | Status: AC | PRN
  Administered 2018-06-23: 10 mL via INTRAVENOUS

## 2018-07-11 ENCOUNTER — Other Ambulatory Visit: Payer: Self-pay | Admitting: Family Medicine

## 2018-07-12 NOTE — Telephone Encounter (Signed)
Last OV 06/19/18 vyvanse last filled 06/19/18 #30 with 0

## 2018-07-25 ENCOUNTER — Encounter: Payer: Self-pay | Admitting: Neurology

## 2018-08-07 ENCOUNTER — Other Ambulatory Visit: Payer: Self-pay | Admitting: Family Medicine

## 2018-08-07 NOTE — Telephone Encounter (Signed)
Last OV 06/19/18 vyvanse last filled 07/13/18 #30 with 0

## 2018-08-30 ENCOUNTER — Other Ambulatory Visit: Payer: Self-pay

## 2018-09-02 ENCOUNTER — Encounter: Payer: Self-pay | Admitting: Family Medicine

## 2018-09-04 ENCOUNTER — Other Ambulatory Visit: Payer: Self-pay

## 2018-09-04 ENCOUNTER — Telehealth: Payer: Medicare Other | Admitting: Neurology

## 2018-09-04 MED ORDER — LISDEXAMFETAMINE DIMESYLATE 20 MG PO CAPS: ORAL_CAPSULE | ORAL | 0 refills | Status: DC

## 2018-09-04 NOTE — Progress Notes (Signed)
New Patient Virtual Visit via Video Note The purpose of this virtual visit is to provide medical care while limiting exposure to the novel coronavirus.    Consent was obtained for video visit:  Yes.   Answered questions that patient had about telehealth interaction:  Yes.   I discussed the limitations, risks, security and privacy concerns of performing an evaluation and management service by telemedicine. I also discussed with the patient that there may be a patient responsible charge related to this service. The patient expressed understanding and agreed to proceed.  Pt location: Home Physician Location: office Name of referring provider:  Sheliah Hatch, MD I connected with [name] at patients initiation/request on 09/05/2018 at  1:00 PM EDT by video enabled telemedicine application and verified that I am speaking with the correct person using two identifiers. Pt MRN:  [id number] Pt DOB:  Sep 16, 1940 Video Participants:  [name];  husband    History of Present Illness: [name] is a 78 y.o. right-handed Caucasian female with migraine, hypothyroidism, and chronic low back pain presenting for evaluation of memory loss.   Starting around 2018, her husband began noticing that she would repeat questions often, despite already having a discussion earlier that day.  This has been gradually getting worse and started to interfere with her ability to do things safely in the home, such as operating the stove/oven or CD player.  She gets frustrated when she is unable to perform tasks.  In 2019, she saw her PCP and had MMSE and scored 24/30. She was briefly on donepezil, but this was discontinued due to intolerance.  Over the past year, she has started to avoid talking to people on the phone due to word-finding difficulty.  She continues to drive locally only and her husband feels safe with her doing so.  Her husband manages finances and meals.  She managed her own medications and  is able to dress, bathe, and feed herself.   Her mother and grandmother both had dementia and she is fearful of developing this.  Her mood is "blah".  Sleep is good.  Husband states that she is cheerful and pleasant.  No behavior changes.  She does not engage in physical exercise due to chronic low back pain.    Out-side paper records, electronic medical record, and images have been reviewed where available and summarized as:  MRI brain wwo contrast 06/23/2018:  Moderate atrophy. Moderate chronic microvascular ischemia. No acute abnormality.  Lab Results  Component Value Date   VITAMINB12 862 01/02/2015    Lab Results  Component Value Date   TSH 0.77 11/23/2017    Past Medical History:  Diagnosis Date  . Allergic rhinitis due to pollen 11/30/2006   Skin test 10/15/09. Retest 10/30/10 Allergy vaccine d/c'd 09/2012 Significant component of irritant/ vasomotor rhinitis    . Chronic fatigue   . 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 Degenerative disk disease    (02/2011 CT scans).  Cervical (MRI 2001 & 2003).  Hx of thoracic epidural injections via Dr. Noel Gerold.  +Lumbar.  . Elevated blood pressure reading without diagnosis of hypertension    ? White coat HTN?   All home bp's normal.  . Fibromyalgia   . GERD (gastroesophageal reflux disease)   . Hypothyroidism   . Insomnia    Ambien occasionally  . Migraine syndrome   . Osteoarthritis   . Osteoporosis    Bisphosphonates  no help.  Prolia caused side effects.  . Sacroiliac joint dysfunction   . Scoliosis deformity of spine    Lumbar  . Spinal stenosis of lumbar region with radiculopathy    + spondylolisthesis.  Dr. Wynn Banker with physical med/rehab did eval 08/2016 and did trial of tramadol instead of hydrocodone--he will be the MD to prescribe her pain meds in the future.  . Urinary incontinence     Past Surgical History:  Procedure Laterality Date  . BLADDER SUSPENSION  1983  . Bone  density  01/2009; 12/2010   T -2.8 to -3.0  . CATARACT EXTRACTION, BILATERAL    . CHOLECYSTECTOMY  2009  . COLONOSCOPY  01/27/2004   diverticulosis  . ESOPHAGOGASTRODUODENOSCOPY ENDOSCOPY  1983; 2005   GERD, distal esoph stricture, no Barrett's esoph  . RECTOCELE REPAIR    . ROTATOR CUFF REPAIR  2011   Right  . TOTAL KNEE ARTHROPLASTY  2008/2011   both knees  . VAGINAL HYSTERECTOMY  1983   Ovaries still in.  Nonmalignant reason.     Medications:  Outpatient Encounter Medications as of 09/05/2018  Medication Sig  . Aspirin-Caffeine (ANACIN PO) Take by mouth.  . bisacodyl (DULCOLAX) 5 MG EC tablet 5 mg as needed.  . cetirizine (ZYRTEC) 10 MG tablet Take 1 tablet (10 mg total) by mouth daily.  . Cholecalciferol (VITAMIN D3) 1000 units CAPS Vitamin D3 1,000 unit (25 mcg) tablet   1 tablet every day by oral route.  . dorzolamidel-timolol (COSOPT) 22.3-6.8 MG/ML SOLN ophthalmic solution   . fluticasone (FLONASE) 50 MCG/ACT nasal spray Place 2 sprays into both nostrils daily.  . folic acid (FOLVITE) 800 MCG tablet folic acid 800 mcg tablet   1 tablet every day by oral route.  Marland Kitchen HYDROcodone-acetaminophen (NORCO) 10-325 MG tablet Take 1 tablet by mouth daily as needed. Rx'ed by Dr. Ethelene Hal  . lisdexamfetamine (VYVANSE) 20 MG capsule TAKE 1 CAPSULE BY MOUTH ONCE DAILY.  Marland Kitchen Lysine 500 MG CAPS lysine 500 mg tablet   1 tablet every day by oral route.  . Multiple Vitamins-Minerals (OCUVITE ADULT FORMULA) CAPS Take 1 capsule by mouth daily.   . multivitamin (THERAGRAN) per tablet Take 1 tablet by mouth daily.   . Potassium 99 MG TABS potassium 99 mg tablet   1 tablet every day by oral route.  . SUMAtriptan (IMITREX) 100 MG tablet TAKE 1 TABLET BY MOUTH FOR MIGRAINES  . SYNTHROID 75 MCG tablet Take 1 tablet (75 mcg total) by mouth daily before breakfast.  . vitamin E 400 UNIT capsule daily.  . memantine (NAMENDA) 10 MG tablet Take half-tablet ( ) daily for one month, then increase to 1 tablet  daily.  . [DISCONTINUED] lisdexamfetamine (VYVANSE) 20 MG capsule TAKE 1 CAPSULE BY MOUTH ONCE DAILY.   No facility-administered encounter medications on file as of 09/05/2018.     Allergies:  Allergies  Allergen Reactions  . Terbinafine Hcl Hives  . Prolia [Denosumab] Rash  . Thimerosal Swelling    Redness  . Lamisil  [Terbinafine] Rash    Family History: Family History  Problem Relation Age of Onset  . Dementia Mother   . Lung cancer Father   . Irritable bowel syndrome Other   . Colon cancer Neg Hx   . Breast cancer Neg Hx     Social History: Social History   Tobacco Use  . Smoking status: Never Smoker  . Smokeless tobacco: Never Used  Substance Use Topics  . Alcohol use: No  . Drug use:  No   Social History   Social History Narrative   Married 53  Years, has 2 sons.   Orig from [place].   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   No T/A/Ds.   Tries to eat carb-conscious diet.       Review of Systems:  CONSTITUTIONAL: No fevers, chills, night sweats, or weight loss.   EYES: No visual changes or eye pain ENT: No hearing changes.  No history of nose bleeds.   RESPIRATORY: No cough, wheezing and shortness of breath.   CARDIOVASCULAR: Negative for chest pain, and palpitations.   GI: Negative for abdominal discomfort, blood in stools or black stools.  No recent change in bowel habits.   GU:  No history of incontinence.   MUSCLOSKELETAL: No history of joint pain or swelling.  No myalgias.   SKIN: Negative for lesions, rash, and itching.   HEMATOLOGY/ONCOLOGY: Negative for prolonged bleeding, bruising easily, and swollen nodes.  No history of cancer. ENDOCRINE: Negative for cold or heat intolerance, polydipsia or goiter.   PSYCH:  No depression or anxiety symptoms.   NEURO: As Above.   Vital Signs:  Ht 5' (1.524 m)   Wt 118 lb (53.5 kg)   BMI 23.05 kg/m    General Medical Exam:  Well appearing, comfortable.  Nonlabored breathing.     Neurological Exam: MENTAL STATUS:  She is awake, oriented to person, place, and time.  Affect appears blunted, but she will smile appropriately.  Speech is not dysarthric, using generalities.  Montreal Cognitive Assessment  09/04/2018  Visuospatial/ Executive (0/5) 3  Naming (0/3) 2  Attention: Read list of digits (0/2) 2  Attention: Read list of letters (0/1) 1  Attention: Serial 7 subtraction starting at 100 (0/3) 0  Language: Repeat phrase (0/2) 1  Language : Fluency (0/1) 0  Abstraction (0/2) 2  Delayed Recall (0/5) 0  Orientation (0/6) 3  Total 14  Adjusted Score (based on education) 15    CRANIAL NERVES:  Normal conjugate, extra-ocular eye movements in all directions of gaze.  No ptosis.  Normal facial symmetry and movements.  Normal shoulder shrug and head rotation.  Tongue is midline.  MOTOR:  Antigravity in all extremities.  No abnormal movements.  No pronator drift.   COORDINATION/GAIT: Normal finger to nose bilaterally.  Intact rapid alternating movements bilaterally.  Gait is slow, unassisted.     IMPRESSION/PLAN: Dementia without behavior disturbance, probable Alzheimer's.  She scored 15/30 on MOCA missing points in all domains, especially delayed recall.  Some of her memory impairment may be stemming from underlying mood disorder. To better characterize the nature of her cognitive changes, she will undergo formal neurocognitive testing.  I have personally viewed her MRI brain which showed moderate generalized cerebral atrophy, worse in the parietal and temporal regions.  Her cognitive difficulties is interfering with her ability to perform IADLs independently and I discussed home safety as well as the need to oversee medication compliance.  Unfortunately, there are no effective treatment for dementia.  She did not tolerate donepezil.  I will offer a trial of memantine 5mg  daily x 1 month, then increase to 10mg  daily.  I encouraged her to start exercise program, even chair  exercises, as well as engages in mentally stimulating activities.  When she returns for testing, I will check vitamin B12 level.    Follow Up Instructions:  I discussed the assessment and treatment plan with the patient. The patient was provided an opportunity to ask  questions and all were answered. The patient agreed with the plan and demonstrated an understanding of the instructions.   The patient was advised to call back or seek an in-person evaluation if the symptoms worsen or if the condition fails to improve as anticipated.  Return to clinic in after testing  Total Time spent:  45 min   Donika Concha Se, DO

## 2018-09-05 ENCOUNTER — Encounter: Payer: Self-pay | Admitting: Neurology

## 2018-09-05 ENCOUNTER — Encounter: Payer: Self-pay | Admitting: *Deleted

## 2018-09-05 ENCOUNTER — Telehealth (INDEPENDENT_AMBULATORY_CARE_PROVIDER_SITE_OTHER): Payer: Medicare Other | Admitting: Neurology

## 2018-09-05 ENCOUNTER — Other Ambulatory Visit: Payer: Self-pay | Admitting: *Deleted

## 2018-09-05 VITALS — Ht 60.0 in | Wt 118.0 lb

## 2018-09-05 DIAGNOSIS — F0390 Unspecified dementia without behavioral disturbance: Secondary | ICD-10-CM

## 2018-09-05 MED ORDER — MEMANTINE HCL 10 MG PO TABS: ORAL_TABLET | ORAL | 5 refills | Status: DC

## 2018-09-05 NOTE — Progress Notes (Signed)
Patient put on list.

## 2018-09-25 ENCOUNTER — Telehealth: Payer: Self-pay | Admitting: Neurology

## 2018-09-25 NOTE — Telephone Encounter (Signed)
Please advise 

## 2018-09-25 NOTE — Telephone Encounter (Signed)
Patient's husband notified ok to stop namenda.

## 2018-09-25 NOTE — Telephone Encounter (Signed)
Husband calling in about the Generic namenda- doesn't want to take b/c side effects. Dizzy/ tired/ sleeps all day/ doesn't feel good. He said the patient just wants to deal with things without medication since there isnt many options. Thanks!

## 2018-09-25 NOTE — Telephone Encounter (Signed)
OK to stop namenda due to intolerance.

## 2018-10-02 ENCOUNTER — Other Ambulatory Visit: Payer: Self-pay | Admitting: Family Medicine

## 2018-10-10 ENCOUNTER — Other Ambulatory Visit: Payer: Self-pay | Admitting: Family Medicine

## 2018-10-10 NOTE — Telephone Encounter (Signed)
Last refill:09/04/18 #30, 0 Last OV:06/19/18

## 2018-10-11 ENCOUNTER — Ambulatory Visit: Payer: Medicare Other | Admitting: Neurology

## 2018-10-27 ENCOUNTER — Other Ambulatory Visit: Payer: Self-pay | Admitting: Family Medicine

## 2018-10-27 NOTE — Telephone Encounter (Signed)
Dr. Tabori's patient 

## 2018-11-07 ENCOUNTER — Other Ambulatory Visit: Payer: Self-pay | Admitting: Family Medicine

## 2018-11-07 NOTE — Telephone Encounter (Signed)
Last OV 06/19/18 vyvanse last filled 10/11/18 #30 with 0

## 2018-11-27 ENCOUNTER — Other Ambulatory Visit: Payer: Self-pay | Admitting: Family Medicine

## 2018-11-28 NOTE — Telephone Encounter (Signed)
Last OV 06/19/18 vyvanse last filled 11/08/18 #30 with 0

## 2019-01-02 ENCOUNTER — Other Ambulatory Visit: Payer: Self-pay | Admitting: Family Medicine

## 2019-01-02 ENCOUNTER — Other Ambulatory Visit: Payer: Self-pay | Admitting: Physician Assistant

## 2019-01-02 NOTE — Telephone Encounter (Signed)
Last OV 06/19/18 vyvanse last filled 11/28/18 #30 with 0

## 2019-01-02 NOTE — Telephone Encounter (Signed)
Dr. Tabori's patient 

## 2019-02-03 ENCOUNTER — Other Ambulatory Visit: Payer: Self-pay | Admitting: Family Medicine

## 2019-02-03 ENCOUNTER — Other Ambulatory Visit: Payer: Self-pay | Admitting: Physician Assistant

## 2019-02-05 NOTE — Telephone Encounter (Signed)
Vyvanse last rx 01/03/19 #30 by Elyn Aquas, PA-C LOV: 06/19/18 acute visit No pending appointment No CSC No UDS

## 2019-02-28 ENCOUNTER — Other Ambulatory Visit: Payer: Self-pay | Admitting: Family Medicine

## 2019-02-28 NOTE — Telephone Encounter (Signed)
Last OV 06/19/18 vyvanse last filled 02/05/19 #30 with 0

## 2019-03-05 ENCOUNTER — Encounter: Payer: Self-pay | Admitting: Family Medicine

## 2019-03-05 ENCOUNTER — Ambulatory Visit (INDEPENDENT_AMBULATORY_CARE_PROVIDER_SITE_OTHER): Payer: Medicare Other | Admitting: Family Medicine

## 2019-03-05 ENCOUNTER — Other Ambulatory Visit: Payer: Self-pay

## 2019-03-05 DIAGNOSIS — W57XXXA Bitten or stung by nonvenomous insect and other nonvenomous arthropods, initial encounter: Secondary | ICD-10-CM | POA: Diagnosis not present

## 2019-03-05 DIAGNOSIS — S20469A Insect bite (nonvenomous) of unspecified back wall of thorax, initial encounter: Secondary | ICD-10-CM

## 2019-03-05 MED ORDER — TRIAMCINOLONE ACETONIDE 0.1 % EX OINT: 1.0000 "application " | TOPICAL_OINTMENT | Freq: Two times a day (BID) | CUTANEOUS | 1 refills | Status: DC

## 2019-03-05 NOTE — Progress Notes (Signed)
Virtual Visit via Video   I connected with patient on 03/05/19 at  1:30 PM EDT by a video enabled telemedicine application and verified that I am speaking with the correct person using two identifiers.  Location patient: Home Location provider: [facility], Office Persons participating in the virtual visit: Patient, Provider, [place] ([place])  I discussed the limitations of evaluation and management by telemedicine and the availability of in person appointments. The patient expressed understanding and agreed to proceed.  Subjective:   HPI:   Shingles- 'my husband thinks I have shingles'.  1st appeared 3-4 days ago on back.  She reports they are worsening.  Has lesions on chest as well.  Denies pain, 'they just itch like the dickens'.  Lesions cross the midline- predominately L sided but some on R as well and cross multiple dermatomes.  Pt denies recent travel or change in sleeping arrangements.  Pt reports she was working outside in the garden prior to these appearing.  ROS:   See pertinent positives and negatives per HPI.  Patient Active Problem List   Diagnosis Date Noted  . 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 Other idiopathic scoliosis, lumbar region 01/07/2017  . Spinal stenosis of lumbar region without neurogenic claudication 10/08/2016  . Chronic fatigue 03/13/2014  . Chronic pain syndrome 01/10/2014  . Lumbar back pain 11/06/2010  . Cervical radiculopathy 06/08/2010  . Osteoporosis 12/04/2008  . 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/17/2008  . HEADACHE 04/17/2008  . URINARY INCONTINENCE 04/17/2008  . Osteoarthritis, multiple sites 02/16/2007  . Hypothyroidism 11/30/2006  . Fibromyalgia 11/30/2006    Social History   Tobacco Use  . Smoking status: Never Smoker  . Smokeless tobacco: Never Used  Substance Use Topics  . Alcohol use: No    Current Outpatient Medications:  .  Aspirin-Caffeine (ANACIN PO), Take by  mouth., Disp: , Rfl:  .  bisacodyl (DULCOLAX) 5 MG EC tablet, 5 mg as needed., Disp: , Rfl:  .  cetirizine (ZYRTEC) 10 MG tablet, Take 1 tablet (10 mg total) by mouth daily., Disp: 30 tablet, Rfl: 11 .  Cholecalciferol (VITAMIN D3) 1000 units CAPS, Vitamin D3 1,000 unit (25 mcg) tablet   1 tablet every day by oral route., Disp: , Rfl:  .  dorzolamidel-timolol (COSOPT) 22.3-6.8 MG/ML SOLN ophthalmic solution, , Disp: , Rfl:  .  fluticasone (FLONASE) 50 MCG/ACT nasal spray, Place 2 sprays into both nostrils daily., Disp: 16 g, Rfl: 0 .  folic acid (FOLVITE) 937 MCG tablet, folic acid 902 mcg tablet   1 tablet every day by oral route., Disp: , Rfl:  .  HYDROcodone-acetaminophen (NORCO) 10-325 MG tablet, Take 1 tablet by mouth daily as needed. Rx'ed by Dr. Nelva Bush, Disp: , Rfl:  .  lisdexamfetamine (VYVANSE) 20 MG capsule, TAKE 1 CAPSULE BY MOUTH ONCE DAILY., Disp: 30 capsule, Rfl: 0 .  Lysine 500 MG CAPS, lysine 500 mg tablet   1 tablet every day by oral route., Disp: , Rfl:  .  Multiple Vitamins-Minerals (OCUVITE ADULT FORMULA) CAPS, Take 1 capsule by mouth daily. , Disp: , Rfl:  .  multivitamin (THERAGRAN) per tablet, Take 1 tablet by mouth daily. , Disp: , Rfl:  .  oxyCODONE-acetaminophen (PERCOCET/ROXICET) 5-325 MG tablet, oxycodone-acetaminophen 5 mg-325 mg tablet, Disp: , Rfl:  .  Potassium 99 MG TABS, potassium 99 mg tablet   1 tablet every day by oral route., Disp: , Rfl:  .  SUMAtriptan (IMITREX) 100 MG tablet, TAKE 1  TABLET BY MOUTH FOR MIGRAINES, Disp: 10 tablet, Rfl: 12 .  SYNTHROID 75 MCG tablet, Take 1 tablet (75 mcg total) by mouth daily before breakfast., Disp: 30 tablet, Rfl: 0 .  vitamin E 400 UNIT capsule, daily., Disp: , Rfl:   Allergies  Allergen Reactions  . Terbinafine Hcl Hives  . Prolia [Denosumab] Rash  . Thimerosal Swelling    Redness  . Lamisil  [Terbinafine] Rash    Objective:   There were no vitals taken for this visit. AAOx3, NAD NCAT, EOMI No obvious CN  deficits Coloring WNL Pt is able to speak clearly, coherently without shortness of breath or increased work of breathing.  Thought process is linear.  Mood is appropriate.  Pt w/ scattered lesions on back that cross midline and extend across multiple dermatomes.  Due to grainy camera difficult to describe but appear to be bites   Assessment and Plan:   Bites- reassurance provided that this is not shingles.  Pt complains of only itching and lesions in question cross midline and multiple dermatomes.  She has been out in her garden daily.  Will start topical triamcinolone for symptom relief.  Encouraged her to wash sheets and towels.  Reviewed supportive care and red flags that should prompt return.  Pt expressed understanding and is in agreement w/ plan.    Neena Rhymes, MD 03/05/2019

## 2019-03-05 NOTE — Progress Notes (Signed)
I have discussed the procedure for the virtual visit with the patient who has given consent to proceed with assessment and treatment.   Unable to obtain vitals  Jessica L Brodmerkel, CMA     

## 2019-04-02 ENCOUNTER — Other Ambulatory Visit: Payer: Self-pay | Admitting: Family Medicine

## 2019-04-03 NOTE — Telephone Encounter (Signed)
Last OV 03/05/19 vyvanse last filled 02/28/19 #30 with 0

## 2019-04-04 ENCOUNTER — Other Ambulatory Visit: Payer: Self-pay | Admitting: Family Medicine

## 2019-04-04 ENCOUNTER — Encounter: Payer: Medicare Other | Admitting: Family Medicine

## 2019-04-04 NOTE — Telephone Encounter (Signed)
Last refill: 10.14.20 #30, 0 Last OV: 10.19.20 dx. Bug bit

## 2019-04-09 ENCOUNTER — Other Ambulatory Visit: Payer: Self-pay | Admitting: Family Medicine

## 2019-04-10 ENCOUNTER — Encounter: Payer: Self-pay | Admitting: General Practice

## 2019-04-10 ENCOUNTER — Encounter: Payer: Self-pay | Admitting: Family Medicine

## 2019-04-10 NOTE — Telephone Encounter (Signed)
Please advise, pt has been seen for a bug bite within the last month. Has not had TSH done in over a year.

## 2019-04-10 NOTE — Telephone Encounter (Signed)
mychart sent to pt to inform.  

## 2019-04-10 NOTE — Telephone Encounter (Signed)
Can refill #30, no refills b/c pt is overdue for appt.  This is last refill

## 2019-04-18 ENCOUNTER — Other Ambulatory Visit: Payer: Self-pay

## 2019-04-18 ENCOUNTER — Encounter: Payer: Self-pay | Admitting: Family Medicine

## 2019-04-18 ENCOUNTER — Ambulatory Visit (INDEPENDENT_AMBULATORY_CARE_PROVIDER_SITE_OTHER): Payer: Medicare Other | Admitting: Family Medicine

## 2019-04-18 VITALS — BP 133/82 | HR 80 | Temp 98.0°F | Ht 62.0 in | Wt 116.0 lb

## 2019-04-18 DIAGNOSIS — R5382 Chronic fatigue, unspecified: Secondary | ICD-10-CM | POA: Diagnosis not present

## 2019-04-18 DIAGNOSIS — M810 Age-related osteoporosis without current pathological fracture: Secondary | ICD-10-CM

## 2019-04-18 DIAGNOSIS — E039 Hypothyroidism, unspecified: Secondary | ICD-10-CM | POA: Diagnosis not present

## 2019-04-18 DIAGNOSIS — N3281 Overactive bladder: Secondary | ICD-10-CM | POA: Diagnosis not present

## 2019-04-18 MED ORDER — MIRABEGRON ER 25 MG PO TB24: 25.0000 mg | ORAL_TABLET | Freq: Every day | ORAL | 3 refills | Status: DC

## 2019-04-18 NOTE — Progress Notes (Signed)
I have discussed the procedure for the virtual visit with the patient who has given consent to proceed with assessment and treatment.   Priti Consoli L Jassen Sarver, CMA     

## 2019-04-18 NOTE — Progress Notes (Signed)
Virtual Visit via Video   I connected with patient on 04/18/19 at  3:00 PM EST by a video enabled telemedicine application and verified that I am speaking with the correct person using two identifiers.  Location patient: Home Location provider: [facility], Office Persons participating in the virtual visit: Patient, Provider, CMA (Jess B)  I discussed the limitations of evaluation and management by telemedicine and the availability of in person appointments. The patient expressed understanding and agreed to proceed.  Subjective:   HPI:   Here today for MWV.  Risk Factors: Hypothyroid- chronic problem, on Levothyroxine daily.  Pt continues to have fatigue.  No changes to skin/hair/nails. Osteoporosis- chronic problem, not currently on medication w/ exception of Vit D.  Due for Vit D level. Chronic fatigue- ongoing issue for pt.  Has had multiple workups done.  No SOB, CP. Urinary frequency- has been worsening over time.  Increased urgency.  Denies dysuria.  Husband reports 6 months of sxs 'at least'.  Wearing depends. Physical Activity: no regular physical activity  Fall Risk: low Depression: denies current sxs Hearing: normal to conversational tones, decreased to whispered voice ADL's: independent Cognitive: + memory loss, on Namenda.  Unable to remember names or conversations Home Safety: safe at home, lives w/ husband Height, Weight, BMI, Visual Acuity: see vitals, vision corrected to 20/20 w/ glasses Counseling: UTD on immunizations, declines mammograms.  No longer doing colonoscopy. Labs Ordered: See A&P Care Plan: See A&P   Patient Care Team    Relationship Specialty Notifications Start End  Sheliah Hatch, MD PCP - General Family Medicine  05/27/17   Durene Romans, MD Consulting Physician Orthopedic Surgery  03/28/14   Sheran Luz, MD Consulting Physician Physical Medicine and Rehabilitation  03/28/14   Carrington Clamp, MD Consulting Physician  Obstetrics and Gynecology  09/08/15   Cherlyn Roberts, MD Consulting Physician Dermatology  11/23/17   Glendale Chard, DO Consulting Physician Neurology  09/05/18      ROS:   See pertinent positives and negatives per HPI.  Patient Active Problem List   Diagnosis Date Noted  . 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 Other idiopathic scoliosis, lumbar region 01/07/2017  . Spinal stenosis of lumbar region without neurogenic claudication 10/08/2016  . Chronic fatigue 03/13/2014  . Chronic pain syndrome 01/10/2014  . Lumbar back pain 11/06/2010  . Cervical radiculopathy 06/08/2010  . Osteoporosis 12/04/2008  . 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/17/2008  . HEADACHE 04/17/2008  . URINARY INCONTINENCE 04/17/2008  . Osteoarthritis, multiple sites 02/16/2007  . Hypothyroidism 11/30/2006  . Fibromyalgia 11/30/2006    Social History   Tobacco Use  . Smoking status: Never Smoker  . Smokeless tobacco: Never Used  Substance Use Topics  . Alcohol use: No    Current Outpatient Medications:  .  Aspirin-Caffeine (ANACIN PO), Take by mouth., Disp: , Rfl:  .  bisacodyl (DULCOLAX) 5 MG EC tablet, 5 mg as needed., Disp: , Rfl:  .  chlorhexidine (PERIDEX) 0.12 % solution, , Disp: , Rfl:  .  Cholecalciferol (VITAMIN D3) 1000 units CAPS, Vitamin D3 1,000 unit (25 mcg) tablet   1 tablet every day by oral route., Disp: , Rfl:  .  dorzolamidel-timolol (COSOPT) 22.3-6.8 MG/ML SOLN ophthalmic solution, , Disp: , Rfl:  .  fluticasone (FLONASE) 50 MCG/ACT nasal spray, Place 2 sprays into both nostrils daily., Disp: 16 g, Rfl: 0 .  folic acid (FOLVITE) 800 MCG tablet, folic acid 800 mcg tablet  1 tablet every day by oral route., Disp: , Rfl:  .  HYDROcodone-acetaminophen (NORCO) 10-325 MG tablet, Take 1 tablet by mouth daily as needed. Rx'ed by Dr. Nelva Bush, Disp: , Rfl:  .  lisdexamfetamine (VYVANSE) 20 MG capsule, TAKE 1 CAPSULE BY MOUTH ONCE DAILY., Disp: 30  capsule, Rfl: 0 .  Lysine 500 MG CAPS, lysine 500 mg tablet   1 tablet every day by oral route., Disp: , Rfl:  .  memantine (NAMENDA) 10 MG tablet, Take 10 mg by mouth daily., Disp: , Rfl:  .  Multiple Vitamins-Minerals (OCUVITE ADULT FORMULA) CAPS, Take 1 capsule by mouth daily. , Disp: , Rfl:  .  multivitamin (THERAGRAN) per tablet, Take 1 tablet by mouth daily. , Disp: , Rfl:  .  Potassium 99 MG TABS, potassium 99 mg tablet   1 tablet every day by oral route., Disp: , Rfl:  .  SUMAtriptan (IMITREX) 100 MG tablet, TAKE 1 TABLET BY MOUTH FOR MIGRAINES, Disp: 10 tablet, Rfl: 12 .  SYNTHROID 75 MCG tablet, Take 1 tablet (75 mcg total) by mouth daily before breakfast., Disp: 30 tablet, Rfl: 0 .  vitamin E 400 UNIT capsule, daily., Disp: , Rfl:   Allergies  Allergen Reactions  . Terbinafine Hcl Hives  . Prolia [Denosumab] Rash  . Thimerosal Swelling    Redness  . Lamisil  [Terbinafine] Rash    Objective:   BP 133/82   Pulse 80   Temp 98 F (36.7 C) (Tympanic)   Ht 5\' 2"  (1.575 m)   Wt 116 lb (52.6 kg)   SpO2 99%   BMI 21.22 kg/m   AAOx3, NAD NCAT, EOMI No obvious CN deficits Coloring WNL Pt is able to speak clearly, coherently without shortness of breath or increased work of breathing.  Thought process is linear.  Mood is appropriate.   Assessment and Plan:   Hypothyroid- chronic problem.  Asymptomatic w/ exception of fatigue which is chronic.  Check labs.  Adjust meds prn   Osteoporosis- check Vit D level and replete prn.  Chronic fatigue- ongoing issue for pt.  She has had multiple workups over the years and takes stimulant medications daily to improve her sxs.  No further workup at this time.  OAB- new.  Pt reports sxs have been worsening over the last 6 months.  She denies sxs of UTI but will get UA when she comes for lab work.  Given her memory issues, want to avoid anticholinergic medications.  Start Myrbetriq and monitor for improvement.  Pt expressed understanding  and is in agreement w/ plan.    Annye Asa, MD 04/18/2019

## 2019-04-24 ENCOUNTER — Other Ambulatory Visit: Payer: Self-pay | Admitting: General Practice

## 2019-04-24 ENCOUNTER — Other Ambulatory Visit: Payer: Self-pay

## 2019-04-24 ENCOUNTER — Ambulatory Visit (INDEPENDENT_AMBULATORY_CARE_PROVIDER_SITE_OTHER): Payer: Medicare Other

## 2019-04-24 ENCOUNTER — Telehealth: Payer: Self-pay | Admitting: Family Medicine

## 2019-04-24 DIAGNOSIS — N3281 Overactive bladder: Secondary | ICD-10-CM

## 2019-04-24 DIAGNOSIS — R5382 Chronic fatigue, unspecified: Secondary | ICD-10-CM | POA: Diagnosis not present

## 2019-04-24 DIAGNOSIS — E039 Hypothyroidism, unspecified: Secondary | ICD-10-CM | POA: Diagnosis not present

## 2019-04-24 DIAGNOSIS — M810 Age-related osteoporosis without current pathological fracture: Secondary | ICD-10-CM | POA: Diagnosis not present

## 2019-04-24 LAB — CBC WITH DIFFERENTIAL/PLATELET
Basophils Absolute: 0 10*3/uL (ref 0.0–0.1)
Basophils Relative: 0.6 % (ref 0.0–3.0)
Eosinophils Absolute: 0.2 10*3/uL (ref 0.0–0.7)
Eosinophils Relative: 3.1 % (ref 0.0–5.0)
HCT: 38.1 % (ref 36.0–46.0)
Hemoglobin: 12.9 g/dL (ref 12.0–15.0)
Lymphocytes Relative: 32.7 % (ref 12.0–46.0)
Lymphs Abs: 2.3 10*3/uL (ref 0.7–4.0)
MCHC: 33.8 g/dL (ref 30.0–36.0)
MCV: 93.1 fl (ref 78.0–100.0)
Monocytes Absolute: 0.6 10*3/uL (ref 0.1–1.0)
Monocytes Relative: 8.4 % (ref 3.0–12.0)
Neutro Abs: 3.9 10*3/uL (ref 1.4–7.7)
Neutrophils Relative %: 55.2 % (ref 43.0–77.0)
Platelets: 148 10*3/uL — ABNORMAL LOW (ref 150.0–400.0)
RBC: 4.09 Mil/uL (ref 3.87–5.11)
RDW: 13.4 % (ref 11.5–15.5)
WBC: 7.1 10*3/uL (ref 4.0–10.5)

## 2019-04-24 LAB — BASIC METABOLIC PANEL
BUN: 25 mg/dL — ABNORMAL HIGH (ref 6–23)
CO2: 28 mEq/L (ref 19–32)
Calcium: 9.1 mg/dL (ref 8.4–10.5)
Chloride: 104 mEq/L (ref 96–112)
Creatinine, Ser: 0.62 mg/dL (ref 0.40–1.20)
GFR: 92.8 mL/min (ref >60.00)
Glucose, Bld: 116 mg/dL — ABNORMAL HIGH (ref 70–99)
Potassium: 3.8 mEq/L (ref 3.5–5.1)
Sodium: 138 mEq/L (ref 135–145)

## 2019-04-24 LAB — TSH: TSH: 3.58 u[IU]/mL (ref 0.35–4.50)

## 2019-04-24 LAB — HEPATIC FUNCTION PANEL
ALT: 22 U/L (ref 0–35)
AST: 38 U/L — ABNORMAL HIGH (ref 0–37)
Albumin: 4 g/dL (ref 3.5–5.2)
Alkaline Phosphatase: 78 U/L (ref 39–117)
Bilirubin, Direct: 0.1 mg/dL (ref 0.0–0.3)
Total Bilirubin: 0.7 mg/dL (ref 0.2–1.2)
Total Protein: 5.8 g/dL — ABNORMAL LOW (ref 6.0–8.3)

## 2019-04-24 LAB — POCT URINALYSIS DIPSTICK
Bilirubin, UA: NEGATIVE
Blood, UA: NEGATIVE
Glucose, UA: NEGATIVE
Ketones, UA: NEGATIVE
Nitrite, UA: POSITIVE
Protein, UA: NEGATIVE
Spec Grav, UA: 1.015 (ref 1.010–1.025)
Urobilinogen, UA: 0.2 E.U./dL
pH, UA: 6 (ref 5.0–8.0)

## 2019-04-24 LAB — LIPID PANEL
Cholesterol: 215 mg/dL — ABNORMAL HIGH (ref 0–200)
HDL: 69.5 mg/dL (ref >39.00)
LDL Cholesterol: 128 mg/dL — ABNORMAL HIGH (ref 0–99)
NonHDL: 145.51
Total CHOL/HDL Ratio: 3
Triglycerides: 87 mg/dL (ref 0.0–149.0)
VLDL: 17.4 mg/dL (ref 0.0–40.0)

## 2019-04-24 LAB — VITAMIN D 25 HYDROXY (VIT D DEFICIENCY, FRACTURES): VITD: 64.28 ng/mL (ref 30.00–100.00)

## 2019-04-24 MED ORDER — CEPHALEXIN 500 MG PO CAPS: 500.0000 mg | ORAL_CAPSULE | Freq: Two times a day (BID) | ORAL | 0 refills | Status: DC

## 2019-04-24 NOTE — Telephone Encounter (Signed)
Pt husband made aware.

## 2019-04-24 NOTE — Telephone Encounter (Signed)
Yes, medication is being sent in

## 2019-04-24 NOTE — Telephone Encounter (Signed)
Pt husband called about pt lab result he saw on Mychart. Pt husband wants to know if something is going to be called into the pharmacy. Please advise and Thank you!  Pharmacy is [facility], [place]  Call husband at [phone]

## 2019-04-30 ENCOUNTER — Other Ambulatory Visit: Payer: Self-pay | Admitting: Family Medicine

## 2019-04-30 ENCOUNTER — Encounter: Payer: Self-pay | Admitting: General Practice

## 2019-04-30 NOTE — Telephone Encounter (Signed)
Last OV 04/18/19 Vyvanse last filled 04/04/19 #30 with 0

## 2019-05-14 ENCOUNTER — Other Ambulatory Visit: Payer: Self-pay | Admitting: Family Medicine

## 2019-05-28 ENCOUNTER — Other Ambulatory Visit: Payer: Self-pay | Admitting: Family Medicine

## 2019-05-28 NOTE — Telephone Encounter (Signed)
Last OV 04/18/19 Vyvanse last filled 04/30/19 #30 with 0

## 2019-06-02 ENCOUNTER — Other Ambulatory Visit: Payer: Self-pay | Admitting: Family Medicine

## 2019-06-22 ENCOUNTER — Other Ambulatory Visit: Payer: Self-pay | Admitting: Family Medicine

## 2019-06-25 ENCOUNTER — Other Ambulatory Visit: Payer: Self-pay | Admitting: Family Medicine

## 2019-06-25 ENCOUNTER — Other Ambulatory Visit: Payer: Self-pay | Admitting: Neurology

## 2019-07-02 ENCOUNTER — Other Ambulatory Visit: Payer: Self-pay | Admitting: Family Medicine

## 2019-07-02 ENCOUNTER — Telehealth: Payer: Self-pay

## 2019-07-02 ENCOUNTER — Encounter: Payer: Self-pay | Admitting: Family Medicine

## 2019-07-02 NOTE — Telephone Encounter (Signed)
FYI A picture of the patient's eyes is being uploaded to her mychart by her husband.

## 2019-07-02 NOTE — Telephone Encounter (Signed)
Patient's husband called in stating the patient is having on going problems with her eyes. He states the area under her eyes are itchy and red. Patient's husband thinks it is the skin under the eye. She has seen Dr. Allena Katz  With [facility] and was told she had blepharitis. She has been using Dorzolomide BID, eye lid wash, eye lid scrubs, pataday, retaime 4-5 times a day, and refresh with no relief. Patient's husband would like for her to see you today. Please advise.

## 2019-07-02 NOTE — Telephone Encounter (Signed)
I don't have any available appts today.  My first suggestion is to call her eye doctor and make them aware of the situation.  And then if they don't get a response from the eye doctor, they can schedule an appt w/ me for tomorrow or Wednesday

## 2019-07-02 NOTE — Telephone Encounter (Signed)
Last OV 04/18/19 Vyvanse last filled 05/29/19 #30 with 0

## 2019-07-02 NOTE — Telephone Encounter (Signed)
PCP advised pt per mychart message to see Dr. Allena Katz.

## 2019-07-06 ENCOUNTER — Encounter: Payer: Self-pay | Admitting: Family Medicine

## 2019-07-19 ENCOUNTER — Other Ambulatory Visit: Payer: Self-pay | Admitting: Family Medicine

## 2019-07-30 ENCOUNTER — Other Ambulatory Visit: Payer: Self-pay | Admitting: Family Medicine

## 2019-07-30 ENCOUNTER — Other Ambulatory Visit: Payer: Self-pay | Admitting: Neurology

## 2019-07-30 NOTE — Telephone Encounter (Signed)
Last OV 04/18/19 vyvanse last filled 07/02/19 #30 with 0

## 2019-08-09 ENCOUNTER — Other Ambulatory Visit: Payer: Self-pay

## 2019-08-09 ENCOUNTER — Telehealth (INDEPENDENT_AMBULATORY_CARE_PROVIDER_SITE_OTHER): Payer: Medicare Other | Admitting: Neurology

## 2019-08-09 ENCOUNTER — Encounter: Payer: Self-pay | Admitting: Neurology

## 2019-08-09 VITALS — Ht 60.0 in | Wt 116.0 lb

## 2019-08-09 DIAGNOSIS — F0390 Unspecified dementia without behavioral disturbance: Secondary | ICD-10-CM

## 2019-08-09 MED ORDER — MEMANTINE HCL 10 MG PO TABS: 10.0000 mg | ORAL_TABLET | Freq: Every day | ORAL | 3 refills | Status: DC

## 2019-08-09 NOTE — Progress Notes (Signed)
   Virtual Visit via Video Note The purpose of this virtual visit is to provide medical care while limiting exposure to the novel coronavirus.    Consent was obtained for video visit:  Yes.   Answered questions that patient had about telehealth interaction:  Yes.   I discussed the limitations, risks, security and privacy concerns of performing an evaluation and management service by telemedicine. I also discussed with the patient that there may be a patient responsible charge related to this service. The patient expressed understanding and agreed to proceed.  Pt location: Home Physician Location: office Name of referring provider:  Sheliah Hatch, MD I connected with [name] at patients initiation/request on 08/09/2019 at 10:50 AM EDT by video enabled telemedicine application and verified that I am speaking with the correct person using two identifiers. Pt MRN:  [id number] Pt DOB:  [birth year] Video Participants:  [name]; husband   History of Present Illness: This is a 79 y.o. female returning for follow-up of dementia.  She has been relatively stable over the past year according to both patient and husband.  She is able to do all ADLs and husband manages finances, household chores, such as cooking/cleaning.  She enjoys working on puzzles.  Mood is good.  She has been compliant with namenda 10mg  daily and tolerating it well.  She has not noticed any improvement on the medication and asks whether she needs to continue it. Husband does not have any new concerns or report behavior issues.  She walks with a cane because of scoliosis and is extra careful to prevent falls.    Observations/Objective:   Vitals:   08/09/19 0816  Weight: 116 lb (52.6 kg)  Height: 5' (1.524 m)   Patient is awake, alert, and appears comfortable.  Oriented x 4.   Extraocular muscles are intact. No ptosis.  Face is symmetric.  Speech is not dysarthric. Antigravity in all extremities.  No pronator  drift. Gait appears antalgic, unassisted   Assessment and Plan:  Dementia without behavioral disturbance, probable Alzheimer's.  Symptoms are relatively stable over the past year with no new concerns. She is able to do all ADLs.  Husband manages IADLs.  -Continue Namenda 10 mg daily.  Refills provided.  -Continue to engage in mentally stimulating activities and gentle exercise as able.  -Encouraged to use cane at all times when walking   Follow Up Instructions:   I discussed the assessment and treatment plan with the patient. The patient was provided an opportunity to ask questions and all were answered. The patient agreed with the plan and demonstrated an understanding of the instructions.   The patient was advised to call back or seek an in-person evaluation if the symptoms worsen or if the condition fails to improve as anticipated.  Follow-up in 1 year    10-12-1992, DO

## 2019-08-21 ENCOUNTER — Encounter: Payer: Self-pay | Admitting: Family Medicine

## 2019-08-22 IMAGING — MR MR HEAD WO/W CM
12 series · 48 of 48 positions shown · IV contrast (10ml multihance)
Comparison: None.

CLINICAL DATA: Diplopia.  Chronic non intractable headache

EXAM:
MRI HEAD WITHOUT AND WITH CONTRAST
TECHNIQUE: Multiplanar, multiecho pulse sequences of the brain and surrounding
structures were obtained without and with intravenous contrast.
CONTRAST:  10mL MULTIHANCE GADOBENATE DIMEGLUMINE 529 MG/ML IV SOLN

[Series 2: T1 · sagittal · 5.0mm · 0.45mm/px · 1 of 21 slices shown]
[im 1/21]
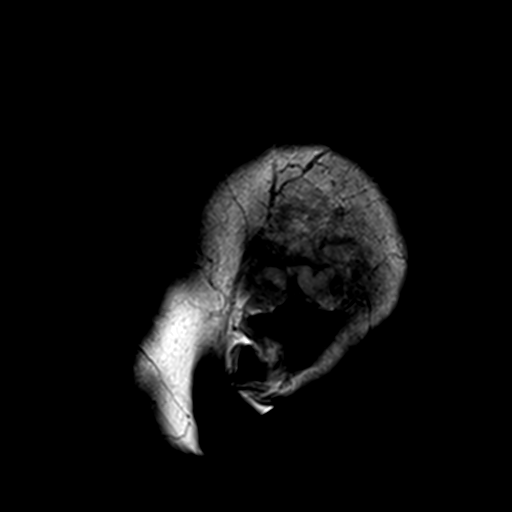

[Series 3: DWI · axial · 3.0mm · 1.80mm/px · z∈[-43,+104]mm · 7 of 100 slices shown (1 of 4)]
[im 1/100]
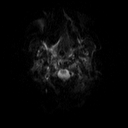
[im 17/100]
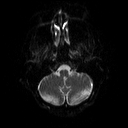
[im 34/100]
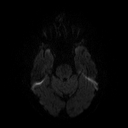
[im 50/100]
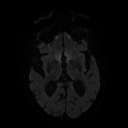
[im 67/100]
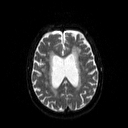
[im 83/100]
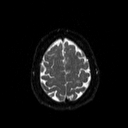
[im 100/100]
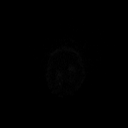

[Series 4: DWI · axial · 3.0mm · 1.80mm/px · z∈[-43,+104]mm · 3 of 48 slices shown (2 of 4)]
[im 1/48]
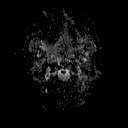
[im 24/48]
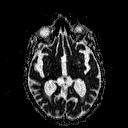
[im 48/48]
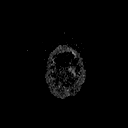

[Series 5: DWI · coronal · 5.0mm · 1.80mm/px · 5 of 68 slices shown (3 of 4)]
[im 1/68]
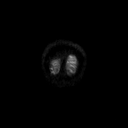
[im 17/68]
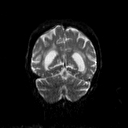
[im 34/68]
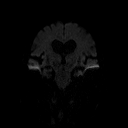
[im 51/68]
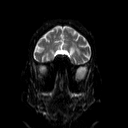
[im 68/68]
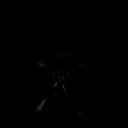

[Series 6: DWI · coronal · 5.0mm · 1.80mm/px · 2 of 33 slices shown (4 of 4)]
[im 1/33]
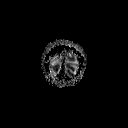
[im 33/33]
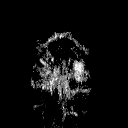

[Series 7: T2 · axial · 5.0mm · 0.51mm/px · z∈[-41,+100]mm · 2 of 22 slices shown (1 of 2)]
[im 1/22]
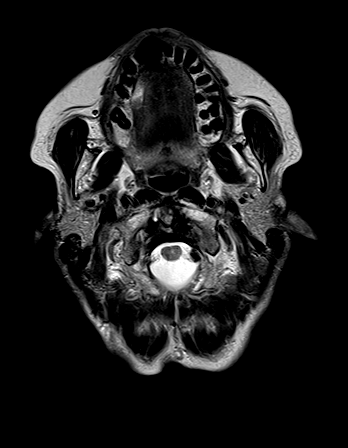
[im 22/22]
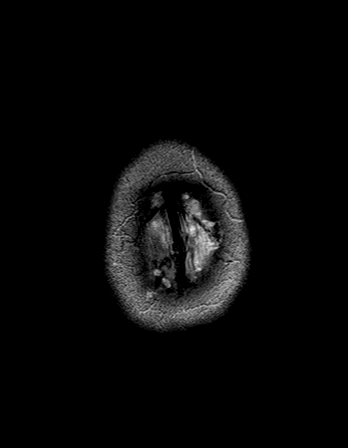

[Series 8: FLAIR · axial · 3.0mm · 0.45mm/px · z∈[-42,+102]mm · 2 of 32 slices shown]
[im 1/32]
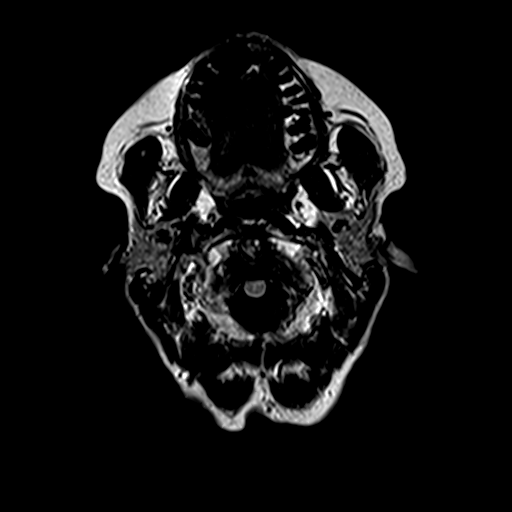
[im 32/32]
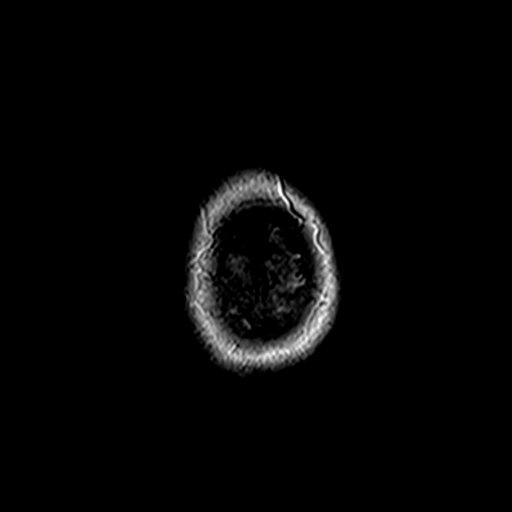

[Series 10: swi_images · axial · 4.0mm · 0.90mm/px · z∈[-40,+100]mm · 2 of 36 slices shown]
[im 1/36]
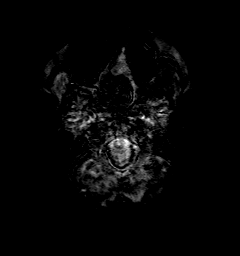
[im 36/36]
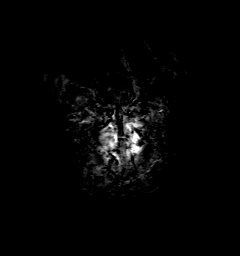

[Series 11: t1_mpr_tra · axial · 1.0mm · 0.75mm/px · z∈[-42,+101]mm · 10 of 144 slices shown (1 of 2)]
[im 1/144]
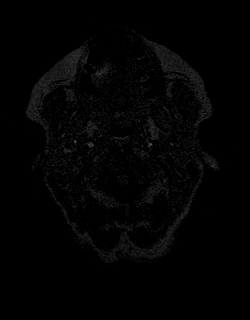
[im 16/144]
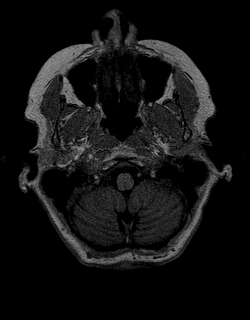
[im 32/144]
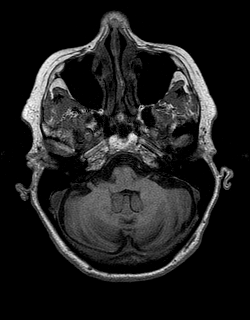
[im 48/144]
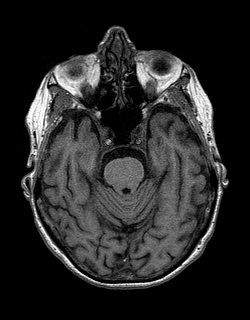
[im 64/144]
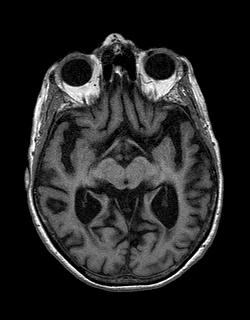
[im 80/144]
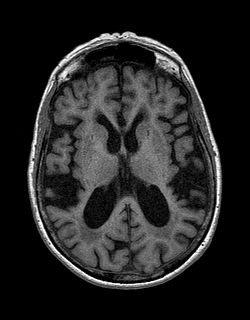
[im 96/144]
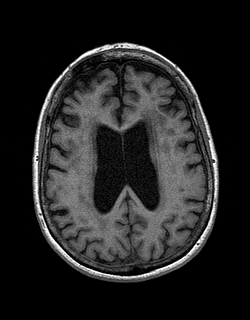
[im 112/144]
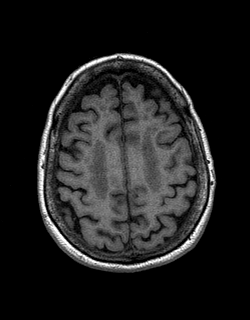
[im 128/144]
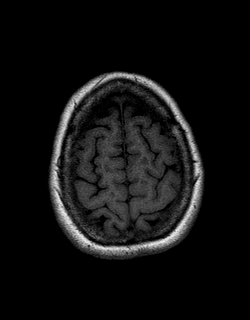
[im 144/144]
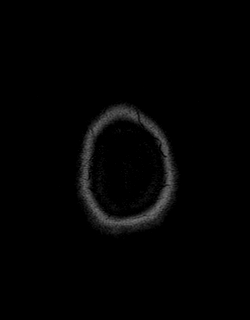

[Series 12: T2 · coronal · 5.0mm · 0.45mm/px · 2 of 25 slices shown (2 of 2)]
[im 1/25]
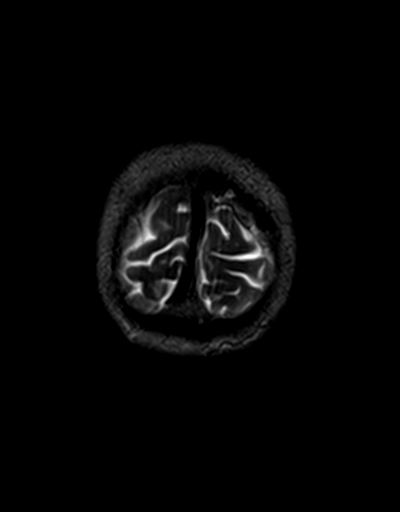
[im 25/25]
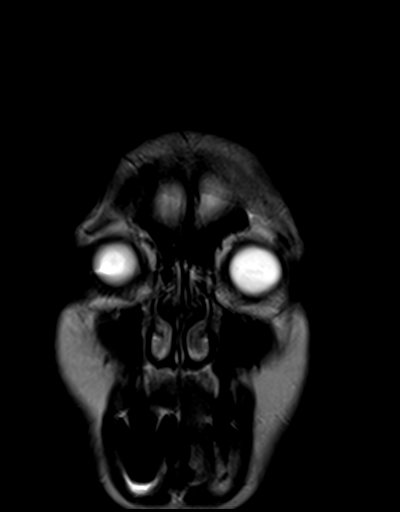

[Series 13: t1_mpr_tra · axial · 1.0mm · 0.75mm/px · z∈[-42,+101]mm · 10 of 144 slices shown (2 of 2)]
[im 1/144]
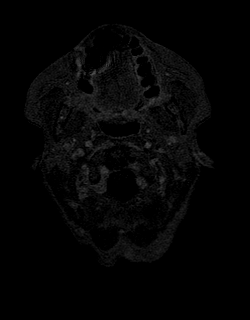
[im 16/144]
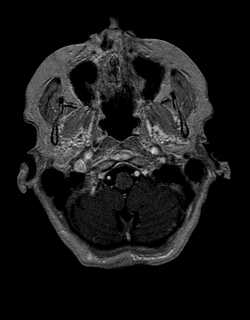
[im 32/144]
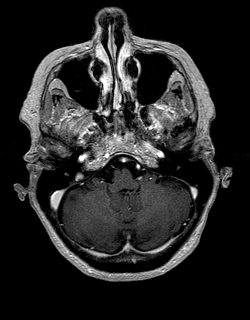
[im 48/144]
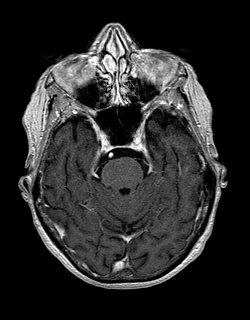
[im 64/144]
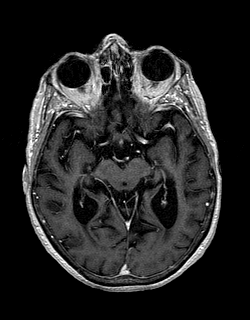
[im 80/144]
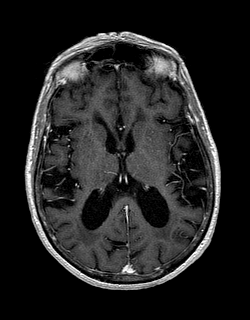
[im 96/144]
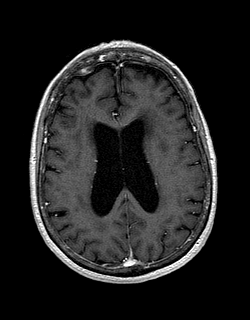
[im 112/144]
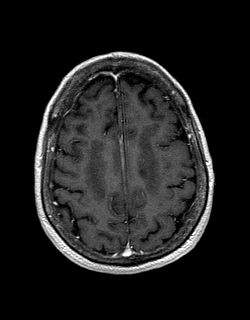
[im 128/144]
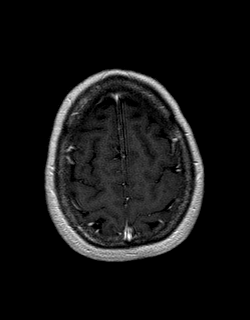
[im 144/144]
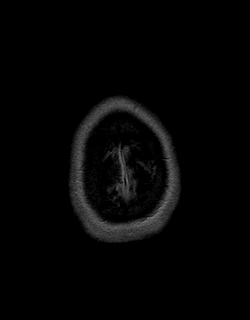

[Series 14: post cor · coronal · 5.0mm · 0.45mm/px · 2 of 25 slices shown]
[im 1/25]
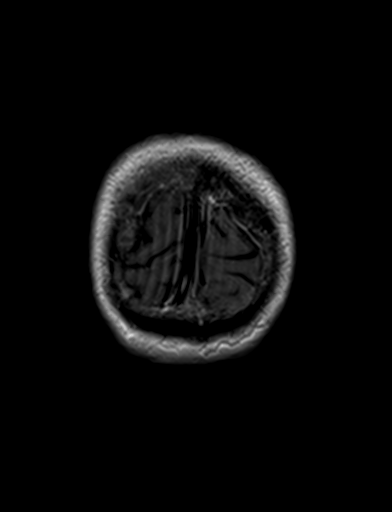
[im 25/25]
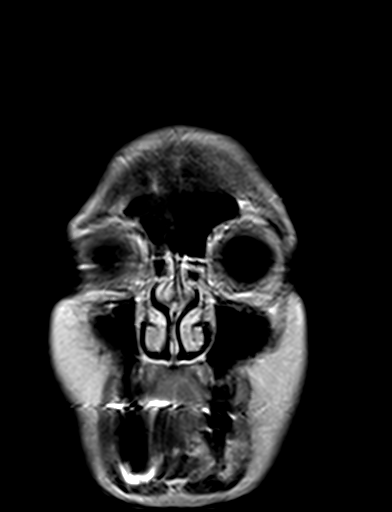

[48 of 48 positions shown; findings below may reference images not displayed]

FINDINGS: Brain: Moderate atrophy. Ventricular enlargement consistent with the
level of atrophy.

Periventricular and patchy deep white matter hyperintensity
bilaterally is moderate in degree. No acute infarct. Brainstem
normal. Negative for hemorrhage or mass.

Normal enhancement postcontrast administration. No enhancing mass
lesion. Normal cavernous sinus.

Vascular: Normal arterial flow voids

Skull and upper cervical spine: Advanced degenerative changes C1-C2
with prominent pannus. No acute skull abnormality

Sinuses/Orbits: Mild mucosal edema paranasal sinuses. Bilateral
cataract surgery

Other: None
IMPRESSION: Moderate atrophy. Moderate chronic microvascular ischemia. No acute
abnormality.

## 2019-08-27 ENCOUNTER — Telehealth: Payer: Medicare Other | Admitting: Family Medicine

## 2019-08-27 ENCOUNTER — Telehealth: Payer: Self-pay | Admitting: Family Medicine

## 2019-08-27 NOTE — Progress Notes (Signed)
  Chronic Care Management   Note  08/27/2019 Name: [name] MRN: [id number] DOB: Apr 04, 1941  [name] is a 79 y.o. year old female who is a primary care patient of Beverely Low, Helane Rima, MD. I reached out to [name] by phone today in response to a referral sent by [name] PCP, Sheliah Hatch, MD.   [name] was given information about Chronic Care Management services today including:  1. CCM service includes personalized support from designated clinical staff supervised by her physician, including individualized plan of care and coordination with other care providers 2. 24/7 contact phone numbers for assistance for urgent and routine care needs. 3. Service will only be billed when office clinical staff spend 20 minutes or more in a month to coordinate care. 4. Only one practitioner may furnish and bill the service in a calendar month. 5. The patient may stop CCM services at any time (effective at the end of the month) by phone call to the office staff.   Patient did not agree to services and wishes to consider information provided before deciding about enrollment in care management services.   Follow up plan:   Lynnae January [employer] Scheduler

## 2019-09-03 ENCOUNTER — Other Ambulatory Visit: Payer: Self-pay | Admitting: Family Medicine

## 2019-09-03 NOTE — Telephone Encounter (Signed)
Last OV 04/18/19 vyvanse last filled 07/30/19 #30 with 0

## 2019-10-01 ENCOUNTER — Other Ambulatory Visit: Payer: Self-pay | Admitting: Family Medicine

## 2019-10-01 NOTE — Telephone Encounter (Signed)
Last OV 04/18/19 VYvanse last filled 09/03/19 #30 with 0

## 2019-10-08 ENCOUNTER — Other Ambulatory Visit: Payer: Self-pay | Admitting: Family Medicine

## 2019-10-31 ENCOUNTER — Other Ambulatory Visit: Payer: Self-pay | Admitting: Family Medicine

## 2019-11-01 NOTE — Telephone Encounter (Signed)
Last refill: 10/01/19 #30, 0 Last OV: 04/18/19 dx. 6 month f/u

## 2019-11-05 ENCOUNTER — Other Ambulatory Visit: Payer: Self-pay | Admitting: Family Medicine

## 2019-11-07 ENCOUNTER — Ambulatory Visit (INDEPENDENT_AMBULATORY_CARE_PROVIDER_SITE_OTHER): Payer: Medicare Other | Admitting: Family Medicine

## 2019-11-07 ENCOUNTER — Encounter: Payer: Self-pay | Admitting: Family Medicine

## 2019-11-07 ENCOUNTER — Other Ambulatory Visit: Payer: Self-pay

## 2019-11-07 VITALS — BP 112/76 | HR 79 | Temp 97.7°F | Resp 16 | Wt 117.4 lb

## 2019-11-07 DIAGNOSIS — K920 Hematemesis: Secondary | ICD-10-CM | POA: Diagnosis not present

## 2019-11-07 DIAGNOSIS — K219 Gastro-esophageal reflux disease without esophagitis: Secondary | ICD-10-CM

## 2019-11-07 DIAGNOSIS — R079 Chest pain, unspecified: Secondary | ICD-10-CM

## 2019-11-07 DIAGNOSIS — R110 Nausea: Secondary | ICD-10-CM | POA: Diagnosis not present

## 2019-11-07 MED ORDER — PANTOPRAZOLE SODIUM 40 MG PO TBEC
40.0000 mg | DELAYED_RELEASE_TABLET | Freq: Every day | ORAL | 3 refills | Status: DC
Start: 2019-11-07 — End: 2020-01-29

## 2019-11-07 NOTE — Patient Instructions (Signed)
Follow up in 2-3 weeks to recheck nausea START the Pantoprazole once daily to decrease the acid production Make sure you are eating regularly throughout the day to prevent your stomach from being empty I think your nausea, chest pain, and coffee ground vomiting are all related to the increased stomach acid IF you have another episode of coffee ground vomiting- go to the ER Call with any questions or concerns Hang in there!

## 2019-11-07 NOTE — Progress Notes (Signed)
   Subjective:    Patient ID: [name], female    DOB: 12/05/1940, 79 y.o.   MRN: [id number]  HPI Nausea- 'it pretty much stays with me'.  sxs started ~2-3 months ago.  Pt reports 'I don't eat much'.  Husband states 'there doesn't seem to be a pattern'.  Pt had 1 episode of vomiting in which she threw up 5 times and 'it was coffee grounds'.  This was 2.5 weeks ago.  CBC was normal on 6/20.  Pt went to ER on 6/20 w/ stabbing chest pain overnight.  Labs were WNL and CP had completely resolved.  Pt reports she is currently nauseous- had a [employer] biscuit this morning and a PB sandwich at noon.     Review of Systems For ROS see HPI   This visit occurred during the SARS-CoV-2 public health emergency.  Safety protocols were in place, including screening questions prior to the visit, additional usage of staff PPE, and extensive cleaning of exam room while observing appropriate contact time as indicated for disinfecting solutions.       Objective:   Physical Exam Vitals reviewed.  Constitutional:      General: She is not in acute distress.    Appearance: She is well-developed.  HENT:     Head: Normocephalic and atraumatic.  Eyes:     Conjunctiva/sclera: Conjunctivae normal.     Pupils: Pupils are equal, round, and reactive to light.  Neck:     Thyroid: No thyromegaly.  Cardiovascular:     Rate and Rhythm: Normal rate and regular rhythm.     Heart sounds: Normal heart sounds. No murmur heard.   Pulmonary:     Effort: Pulmonary effort is normal. No respiratory distress.     Breath sounds: Normal breath sounds.  Abdominal:     General: There is no distension.     Palpations: Abdomen is soft.     Tenderness: There is no abdominal tenderness. There is no guarding or rebound.  Musculoskeletal:     Cervical back: Normal range of motion and neck supple.  Lymphadenopathy:     Cervical: No cervical adenopathy.  Skin:    General: Skin is warm and dry.  Neurological:     Mental Status:  She is alert. Mental status is at baseline.  Psychiatric:        Behavior: Behavior normal.           Assessment & Plan:  GERD- suspect this was the cause of her CP the other night as the acid likely triggered an esophageal spasm.  She does not eat regularly throughout the day and likely has excess acid in relation to her need for digestion.  Start Protonix once daily.  Encouraged her to eat frequently throughout the day to prevent her stomach from being empty.  Will follow.  Coffee ground emesis- occurred 2.5 weeks ago.  Recent CBC WNL.  Has not had another episode.  Suspect this is due to her excess stomach acid/gastritis.  If she has a repeat episode she will need to go to ER and then f/u w/ GI.  Pt and husband expressed understanding.  CP- negative cardiac w/u at [facility] ER.  Suspect this was esophageal spasm/reflux.  Nausea- most likely due to excess stomach acid/gastritis.  Start PPI and monitor for improvement.

## 2019-11-16 ENCOUNTER — Encounter: Payer: Self-pay | Admitting: Family Medicine

## 2019-11-16 ENCOUNTER — Other Ambulatory Visit: Payer: Self-pay

## 2019-11-16 ENCOUNTER — Ambulatory Visit (INDEPENDENT_AMBULATORY_CARE_PROVIDER_SITE_OTHER): Payer: Medicare Other | Admitting: Family Medicine

## 2019-11-16 VITALS — BP 110/70 | HR 97 | Temp 97.8°F | Resp 16 | Ht 60.0 in | Wt 117.0 lb

## 2019-11-16 DIAGNOSIS — R110 Nausea: Secondary | ICD-10-CM

## 2019-11-16 DIAGNOSIS — K219 Gastro-esophageal reflux disease without esophagitis: Secondary | ICD-10-CM | POA: Diagnosis not present

## 2019-11-16 DIAGNOSIS — R5382 Chronic fatigue, unspecified: Secondary | ICD-10-CM | POA: Diagnosis not present

## 2019-11-16 NOTE — Patient Instructions (Signed)
Follow up in 6-8 weeks to recheck energy level CONTINUE the Pantoprazole daily I am SO glad the nausea is better Try and give yourself a task to complete each day and try and be more engaged to improve your energy level STOP the Vyvanse Call with any questions or concerns Happy 4th of July!

## 2019-11-16 NOTE — Progress Notes (Signed)
   Subjective:    Patient ID: [name], female    DOB: [birth year], 79 y.o.   MRN: [id number]  HPI Nausea- pt reports nausea is much better.  'it's still there once in awhile a little bit but it's a lot better'.  No recurrence of coffee ground emesis.    GERD- started on Protonix 40mg  daily at last visit.  No recent CP- nocturnal or otherwise.    Chronic fatigue- pt stopped her Vyvanse 2-3 weeks ago and husband has not noticed a change in her energy level.     Review of Systems For ROS see HPI   This visit occurred during the SARS-CoV-2 public health emergency.  Safety protocols were in place, including screening questions prior to the visit, additional usage of staff PPE, and extensive cleaning of exam room while observing appropriate contact time as indicated for disinfecting solutions.       Objective:   Physical Exam Vitals reviewed.  Constitutional:      General: She is not in acute distress.    Appearance: Normal appearance. She is not ill-appearing.  HENT:     Head: Normocephalic and atraumatic.  Skin:    General: Skin is warm and dry.  Neurological:     General: No focal deficit present.     Mental Status: She is alert. Mental status is at baseline.     Gait: Gait normal.  Psychiatric:        Mood and Affect: Mood normal.        Behavior: Behavior normal.        Thought Content: Thought content normal.           Assessment & Plan:  Nausea- almost completely resolved since starting Pantoprazole.  No changes at this time.  GERD- much improved since starting PPI.  No longer having CP- nocturnal or otherwise.  No changes at this time.

## 2019-11-16 NOTE — Assessment & Plan Note (Signed)
Ongoing issue.  This has been unchanged for many years.  Pt and family again realize that the vyvanse is not providing the increased energy level that they were hoping for.  D/c meds at this time.  Encouraged her to follow some sort of daily schedule and stay more engaged to improve her energy level.  Husband reports they will try.  Will follow.

## 2019-11-22 ENCOUNTER — Other Ambulatory Visit: Payer: Self-pay

## 2019-11-22 DIAGNOSIS — F0390 Unspecified dementia without behavioral disturbance: Secondary | ICD-10-CM

## 2019-11-22 DIAGNOSIS — R479 Unspecified speech disturbances: Secondary | ICD-10-CM

## 2019-12-08 ENCOUNTER — Other Ambulatory Visit: Payer: Self-pay | Admitting: Family Medicine

## 2020-01-02 ENCOUNTER — Other Ambulatory Visit: Payer: Self-pay

## 2020-01-02 ENCOUNTER — Encounter: Payer: Self-pay | Admitting: Family Medicine

## 2020-01-02 ENCOUNTER — Telehealth (INDEPENDENT_AMBULATORY_CARE_PROVIDER_SITE_OTHER): Payer: Medicare Other | Admitting: Family Medicine

## 2020-01-02 VITALS — BP 131/79 | HR 82 | Temp 97.6°F | Ht 60.0 in

## 2020-01-02 DIAGNOSIS — R5382 Chronic fatigue, unspecified: Secondary | ICD-10-CM | POA: Diagnosis not present

## 2020-01-02 DIAGNOSIS — R413 Other amnesia: Secondary | ICD-10-CM

## 2020-01-02 NOTE — Progress Notes (Signed)
Virtual Visit via Video   I connected with patient on 01/02/20 at  2:00 PM EDT by a video enabled telemedicine application and verified that I am speaking with the correct person using two identifiers.  Location patient: Home Location provider: [facility], Office Persons participating in the virtual visit: Patient, Provider, CMA (Jess B)  I discussed the limitations of evaluation and management by telemedicine and the availability of in person appointments. The patient expressed understanding and agreed to proceed.  Subjective:   HPI:   Fatigue- pt stopped Vyvanse at last visit b/c it was not helping.  Discussed her depression and lack of engagement throughout the day were likely causing some of her fatigue.  Pt is trying to walk daily.  Confusion- pt reports she is frequently confused and this is scary to her, 'especially when you don't know what you're confused about'.  ROS:   See pertinent positives and negatives per HPI.  Patient Active Problem List   Diagnosis Date Noted  . 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 Other idiopathic scoliosis, lumbar region 01/07/2017  . Spinal stenosis of lumbar region without neurogenic claudication 10/08/2016  . Chronic fatigue 03/13/2014  . Chronic pain syndrome 01/10/2014  . Lumbar back pain 11/06/2010  . Cervical radiculopathy 06/08/2010  . Osteoporosis 12/04/2008  . 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/17/2008  . 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 HEADACHE 04/17/2008  . URINARY INCONTINENCE 04/17/2008  . Osteoarthritis, multiple sites 02/16/2007  . Hypothyroidism 11/30/2006  . Fibromyalgia 11/30/2006    Social History   Tobacco Use  . Smoking status: Never Smoker  . Smokeless tobacco: Never Used  Substance Use Topics  . Alcohol use: No    Current Outpatient  Medications:  .  Aspirin-Caffeine (ANACIN PO), Take by mouth., Disp: , Rfl:  .  bisacodyl (DULCOLAX) 5 MG EC tablet, 5 mg as needed., Disp: , Rfl:  .  chlorhexidine (PERIDEX) 0.12 % solution, , Disp: , Rfl:  .  Cholecalciferol (VITAMIN D3) 1000 units CAPS, Vitamin D3 1,000 unit (25 mcg) tablet   1 tablet every day by oral route., Disp: , Rfl:  .  HYDROcodone-acetaminophen (NORCO) 10-325 MG tablet, Take 1 tablet by mouth daily as needed. Rx'ed by Dr. Ethelene Hal, Disp: , Rfl:  .  Lysine 500 MG CAPS, lysine 500 mg tablet   1 tablet every day by oral route., Disp: , Rfl:  .  memantine (NAMENDA) 10 MG tablet, Take 1 tablet (10 mg total) by mouth daily., Disp: 90 tablet, Rfl: 3 .  Multiple Vitamins-Minerals (OCUVITE ADULT FORMULA) CAPS, Take 1 capsule by mouth daily. , Disp: , Rfl:  .  pantoprazole (PROTONIX) 40 MG tablet, Take 1 tablet (40 mg total) by mouth daily., Disp: 30 tablet, Rfl: 3 .  Potassium 99 MG TABS, potassium 99 mg tablet   1 tablet every day by oral route., Disp: , Rfl:  .  SUMAtriptan (IMITREX) 100 MG tablet, TAKE 1 TABLET BY MOUTH FOR MIGRAINES., Disp: 9 tablet, Rfl: 0 .  SYNTHROID 75 MCG tablet, Take 1 tablet (75 mcg total) by mouth daily before breakfast., Disp: 90 tablet, Rfl: 0 .  vitamin E 400 UNIT capsule, daily., Disp: , Rfl:   Allergies  Allergen Reactions  . Terbinafine Hcl Hives  . Prolia [Denosumab] Rash  . Dorzolamide Hcl-Timolol Mal Other (See Comments)  . Thimerosal Swelling    Redness  .  Timolol   . Lamisil  [Terbinafine] Rash    Objective:   BP 131/79   Pulse 82   Temp 97.6 F (36.4 C) (Oral)   Ht 5' (1.524 m)   SpO2 96%   BMI 22.85 kg/m   Pt is able to speak clearly, coherently without shortness of breath or increased work of breathing. Thought process is linear.  Mood is appropriate.   Assessment and Plan:   Fatigue- pt reports feeling better today than her last visit.  She is now walking regularly which is helping.  Again encouraged her to be more  involved and keep to a daily schedule.  Memory loss- worsening.  Pt admits that she is now fearful bc she is becoming more confused, more frequently.  Discussed trying to keep a structure/schedule as this can be comforting.  Will continue to follow.   Neena Rhymes, MD 01/02/2020  Time spent with the patient: 12 minutes, of which >50% was spent in obtaining information about symptoms, reviewing previous labs, evaluations, and treatments, counseling about condition (please see the discussed topics above), and developing a plan to further investigate it; had a number of questions which I addressed.

## 2020-01-02 NOTE — Progress Notes (Signed)
I have discussed the procedure for the virtual visit with the patient who has given consent to proceed with assessment and treatment.   Manmeet Arzola L Rachid Parham, CMA     

## 2020-01-03 ENCOUNTER — Encounter: Payer: Self-pay | Admitting: Family Medicine

## 2020-01-14 ENCOUNTER — Other Ambulatory Visit: Payer: Self-pay | Admitting: Family Medicine

## 2020-01-29 ENCOUNTER — Other Ambulatory Visit: Payer: Self-pay | Admitting: Family Medicine

## 2020-02-21 ENCOUNTER — Other Ambulatory Visit: Payer: Self-pay | Admitting: Family Medicine

## 2020-03-12 ENCOUNTER — Other Ambulatory Visit: Payer: Self-pay | Admitting: Family Medicine

## 2020-03-14 ENCOUNTER — Other Ambulatory Visit: Payer: Self-pay | Admitting: Family Medicine

## 2020-03-24 ENCOUNTER — Other Ambulatory Visit: Payer: Self-pay | Admitting: Neurology

## 2020-04-18 ENCOUNTER — Other Ambulatory Visit: Payer: Self-pay | Admitting: Family Medicine

## 2020-04-21 ENCOUNTER — Telehealth: Payer: Self-pay | Admitting: Family Medicine

## 2020-04-21 NOTE — Telephone Encounter (Signed)
Attempted to schedule AWV. Unable to LVM.  Will try at later time.   Called patient to schedule Annual Wellness Visit.  Please schedule with Nurse Health Advisor Martha Stanley, RN at [place]  

## 2020-06-06 ENCOUNTER — Other Ambulatory Visit: Payer: Self-pay | Admitting: Family Medicine

## 2020-06-09 ENCOUNTER — Other Ambulatory Visit: Payer: Self-pay | Admitting: Family Medicine

## 2020-06-30 ENCOUNTER — Other Ambulatory Visit: Payer: Self-pay | Admitting: Family Medicine

## 2020-07-10 NOTE — Progress Notes (Signed)
Subjective:   [name] is a 80 y.o. female who presents for Medicare Annual (Subsequent) preventive examination.  I connected with Jeanice today by a video enabled telemedicine application and verified that I am speaking with the correct person using two identifiers.  Location of patient:Home Location of provider:Work  Persons participating in the virtual visit: patient, husband, nurse.   I discussed the limitations, risk, security and privacy concerns of evaluation and management by telemedicine. The patient expressed understanding and agreed to proceed.  Some vital signs may be absent or patient reported.   Review of Systems     Cardiac Risk Factors include: advanced age (>35men, >94 women);sedentary lifestyle     Objective:    Today's Vitals   07/14/20 1452  Weight: 117 lb (53.1 kg)  Height: 5' (1.524 m)  PainSc: 7    Body mass index is 22.85 kg/m.  Advanced Directives 08/09/2019 09/05/2018 11/23/2017 01/07/2017 10/18/2016 08/30/2016  Does Patient Have a Medical Advance Directive? Yes Yes Yes Yes Yes Yes  Type of Advance Directive - - Living will;Healthcare Power of [employer] Power of [place];Living will Healthcare Power of [place];Living will Healthcare Power of [place];Living will  Copy of Healthcare Power of Attorney in Chart? - - No - copy requested No - copy requested No - copy requested -    Current Medications (verified) Outpatient Encounter Medications as of 07/14/2020  Medication Sig  . Aspirin-Caffeine (ANACIN PO) Take by mouth.  . chlorhexidine (PERIDEX) 0.12 % solution   . Cholecalciferol (VITAMIN D3) 1000 units CAPS Vitamin D3 1,000 unit (25 mcg) tablet   1 tablet every day by oral route.  Marland Kitchen HYDROcodone-acetaminophen (NORCO) 10-325 MG tablet Take 1 tablet by mouth daily as needed. Rx'ed by Dr. Ethelene Hal  . Lysine 500 MG CAPS lysine 500 mg tablet   1 tablet every day by oral route.  . memantine (NAMENDA) 10 MG tablet Take ONE-half (0.5) tablet  (5mg ) BY MOUTH daily for one month, then increase to 1 tablet daily.  . Multiple Vitamins-Minerals (OCUVITE ADULT FORMULA) CAPS Take 1 capsule by mouth daily.  . pantoprazole (PROTONIX) 40 MG tablet Take 1 tablet (40 mg total) by mouth daily.  . Potassium 99 MG TABS potassium 99 mg tablet   1 tablet every day by oral route.  . SUMAtriptan (IMITREX) 100 MG tablet TAKE 1 TABLET BY MOUTH FOR MIGRAINES.  . SYNTHROID 75 MCG tablet Take 1 tablet (75 mcg total) by mouth daily before breakfast.  . vitamin E 400 UNIT capsule daily.  . bisacodyl (DULCOLAX) 5 MG EC tablet 5 mg as needed.   No facility-administered encounter medications on file as of 07/14/2020.    Allergies (verified) Terbinafine hcl, Prolia [denosumab], Dorzolamide hcl-timolol mal, Thimerosal, Timolol, and Lamisil  [terbinafine]   History: Past Medical History:  Diagnosis Date  . Allergic rhinitis due to pollen 11/30/2006   Skin test 10/15/09. Retest 10/30/10 Allergy vaccine d/c'd 09/2012 Significant component of irritant/ vasomotor rhinitis    . Chronic fatigue   . 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 Degenerative disk disease    (02/2011 CT scans).  Cervical (MRI 2001 & 2003).  Hx of thoracic epidural injections via Dr. 2004.  +Lumbar.  . Elevated blood pressure reading without diagnosis of hypertension    ? White coat HTN?   All home bp's normal.  . Fibromyalgia   . GERD (gastroesophageal reflux disease)   . Hypothyroidism   .  Insomnia    Ambien occasionally  . Migraine syndrome   . Osteoarthritis   . Osteoporosis    Bisphosphonates no help.  Prolia caused side effects.  . Sacroiliac joint dysfunction   . Scoliosis deformity of spine    Lumbar  . Spinal stenosis of lumbar region with radiculopathy    + spondylolisthesis.  Dr. Wynn BankerKirsteins with physical med/rehab did eval 08/2016 and did trial of tramadol instead of hydrocodone--he will be the MD to prescribe her pain  meds in the future.  . Urinary incontinence    Past Surgical History:  Procedure Laterality Date  . BLADDER SUSPENSION  1983  . Bone density  01/2009; 12/2010   T -2.8 to -3.0  . CATARACT EXTRACTION, BILATERAL    . CHOLECYSTECTOMY  2009  . COLONOSCOPY  01/27/2004   diverticulosis  . ESOPHAGOGASTRODUODENOSCOPY ENDOSCOPY  1983; 2005   GERD, distal esoph stricture, no Barrett's esoph  . RECTOCELE REPAIR    . ROTATOR CUFF REPAIR  2011   Right  . TOTAL KNEE ARTHROPLASTY  2008/2011   both knees  . VAGINAL HYSTERECTOMY  1983   Ovaries still in.  Nonmalignant reason.   Family History  Problem Relation Age of Onset  . Dementia Mother   . Lung cancer Father   . Irritable bowel syndrome Other   . Colon cancer Neg Hx   . Breast cancer Neg Hx    Social History   Socioeconomic History  . Marital status: Married    Spouse name: Not on file  . Number of children: Not on file  . Years of education: 5412  . Highest education level: High school graduate  Occupational History  . Occupation: retired    Associate Professormployer: RETIRED  Tobacco Use  . Smoking status: Never Smoker  . Smokeless tobacco: Never Used  Vaping Use  . Vaping Use: Never used  Substance and Sexual Activity  . Alcohol use: No  . Drug use: No  . Sexual activity: Yes  Other Topics Concern  . Not on file  Social History Narrative   Married 53  Years, has 2 sons.   Orig from [place].   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   No T/A/Ds.   Tries to eat carb-conscious diet.      Social Determinants of Health   Financial Resource Strain: Low Risk   . Difficulty of Paying Living Expenses: Not hard at all  Food Insecurity: No Food Insecurity  . Worried About Programme researcher, broadcasting/film/videounning Out of Food in the Last Year: Never true  . Ran Out of Food in the Last Year: Never true  Transportation Needs: No Transportation Needs  . Lack of Transportation (Medical): No  . Lack of Transportation (Non-Medical): No  Physical  Activity: Inactive  . Days of Exercise per Week: 0 days  . Minutes of Exercise per Session: 0 min  Stress: No Stress Concern Present  . Feeling of Stress : Not at all  Social Connections: Moderately Isolated  . Frequency of Communication with Friends and Family: More than three times a week  . Frequency of Social Gatherings with Friends and Family: Once a week  . Attends Religious Services: Never  . Active Member of Clubs or Organizations: No  . Attends BankerClub or Organization Meetings: Never  . Marital Status: Married    Tobacco Counseling Counseling given: Not Answered   Clinical Intake:  Pre-visit preparation completed: Yes  Pain : 0-10 Pain Score: 7  Pain Type: Chronic pain Pain  Location: Shoulder Pain Orientation: Right Pain Onset: More than a month ago Pain Frequency: Constant     Nutritional Status: BMI of 19-24  Normal Nutritional Risks: None Diabetes: No  How often do you need to have someone help you when you read instructions, pamphlets, or other written materials from your doctor or pharmacy?: 1 - Never  Diabetic?No  Interpreter Needed?: No  Information entered by :: Thomasenia Sales LPN   Activities of Daily Living In your present state of health, do you have any difficulty performing the following activities: 07/14/2020 01/02/2020  Hearing? N N  Vision? N N  Difficulty concentrating or making decisions? Y N  Walking or climbing stairs? N N  Dressing or bathing? N N  Doing errands, shopping? Y N  Preparing Food and eating ? Y -  Comment husband assist -  Using the Toilet? N -  In the past six months, have you accidently leaked urine? N -  Do you have problems with loss of bowel control? N -  Managing your Medications? Y -  Managing your Finances? Y -  Housekeeping or managing your Housekeeping? Y -  Some recent data might be hidden    Patient Care Team: Sheliah Hatch, MD as PCP - General (Family Medicine) Durene Romans, MD as Consulting  Physician (Orthopedic Surgery) Sheran Luz, MD as Consulting Physician (Physical Medicine and Rehabilitation) Carrington Clamp, MD as Consulting Physician (Obstetrics and Gynecology) Cherlyn Roberts, MD as Consulting Physician (Dermatology) Glendale Chard, DO as Consulting Physician (Neurology)  Indicate any recent Medical Services you may have received from other than [facility] providers in the past year (date may be approximate).     Assessment:   This is a routine wellness examination for Abisai.  Hearing/Vision screen  Hearing Screening   125Hz  250Hz  500Hz  1000Hz  2000Hz  3000Hz  4000Hz  6000Hz  8000Hz   Right ear:           Left ear:           Comments: Hearing aids  Vision Screening Comments: [facility] exam-01/2020-[facility]   Dietary issues and exercise activities discussed: Current Exercise Habits: The patient does not participate in regular exercise at present, Exercise limited by: orthopedic condition(s)  Goals      Patient Stated   .  Patient states (pt-stated)      Maintain current health.      Other   .  Patient Stated      Increase protein intake.     .  Patient Stated      Eat more regular meals       Depression Screen PHQ 2/9 Scores 07/14/2020 11/16/2019 11/07/2019 04/18/2019 03/05/2019 06/19/2018 06/01/2018  PHQ - 2 Score 1 0 0 0 0 0 0  PHQ- 9 Score - - 4 0 - - 0    Fall Risk Fall Risk  07/14/2020 01/02/2020 11/16/2019 11/07/2019 08/09/2019  Falls in the past year? 0 0 0 0 0  Number falls in past yr: 0 0 0 0 -  Injury with Fall? 0 0 0 0 -  Risk for fall due to : - Impaired balance/gait;Impaired mobility - - -  Follow up Falls prevention discussed Falls evaluation completed Falls evaluation completed Falls evaluation completed -    FALL RISK PREVENTION PERTAINING TO THE HOME:  Any stairs in or around the home? Yes  If so, are there any without handrails? No  Home free of loose throw rugs in walkways, pet beds, electrical cords, etc? Yes  Adequate lighting  in  your home to reduce risk of falls? Yes   ASSISTIVE DEVICES UTILIZED TO PREVENT FALLS:  Life alert? No  Use of a cane, walker or w/c? No  Grab bars in the bathroom? Yes  Shower chair or bench in shower? No  Elevated toilet seat or a handicapped toilet? No   TIMED UP AND GO:  Was the test performed? No . Phone visit    Cognitive Function:Patient has seen a Neurologist for memory loss & is currently taking Namenda.  MMSE - Mini Mental State Exam 11/23/2017  Orientation to time 4  Orientation to Place 3  Registration 3  Attention/ Calculation 4  Recall 0  Language- name 2 objects 2  Language- repeat 1  Language- follow 3 step command 3  Language- read & follow direction 1  Write a sentence 1  Copy design 1  Total score 23   Montreal Cognitive Assessment  09/04/2018  Visuospatial/ Executive (0/5) 3  Naming (0/3) 2  Attention: Read list of digits (0/2) 2  Attention: Read list of letters (0/1) 1  Attention: Serial 7 subtraction starting at 100 (0/3) 0  Language: Repeat phrase (0/2) 1  Language : Fluency (0/1) 0  Abstraction (0/2) 2  Delayed Recall (0/5) 0  Orientation (0/6) 3  Total 14  Adjusted Score (based on education) 15      Immunizations Immunization History  Administered Date(s) Administered  . Influenza Split 02/10/2011, 02/01/2012  . Influenza Whole 05/17/2005, 02/15/2008, 03/04/2009, 02/12/2010  . Influenza, High Dose Seasonal PF 03/05/2013, 05/07/2015, 04/20/2017, 02/26/2019  . Influenza,inj,Quad PF,6+ Mos 01/10/2014  . Influenza,inj,quad, With Preservative 02/14/2017  . Influenza-Unspecified 01/15/2014, 02/07/2020  . Moderna Sars-Covid-2 Vaccination 06/25/2019, 07/23/2019, 03/17/2020  . Pneumococcal Conjugate-13 09/02/2014  . Pneumococcal Polysaccharide-23 05/17/2005, 04/20/2017  . Td 05/17/2005  . Zoster 05/16/2009, 04/16/2012    TDAP status: Due, Education has been provided regarding the importance of this vaccine. Advised may receive this vaccine  at local pharmacy or Health Dept. Aware to provide a copy of the vaccination record if obtained from local pharmacy or Health Dept. Verbalized acceptance and understanding.  Flu Vaccine status: Up to date  Pneumococcal vaccine status: Up to date  Covid-19 vaccine status: Completed vaccines  Qualifies for Shingles Vaccine? Yes   Zostavax completed Yes   Shingrix Completed?: No.    Education has been provided regarding the importance of this vaccine. Patient has been advised to call insurance company to determine out of pocket expense if they have not yet received this vaccine. Advised may also receive vaccine at local pharmacy or Health Dept. Verbalized acceptance and understanding.  Screening Tests Health Maintenance  Topic Date Due  . TETANUS/TDAP  05/18/2015  . INFLUENZA VACCINE  Completed  . DEXA SCAN  Completed  . COVID-19 Vaccine  Completed  . PNA vac Low Risk Adult  Completed    Health Maintenance  Health Maintenance Due  Topic Date Due  . TETANUS/TDAP  05/18/2015    Colorectal cancer screening: No longer required.   Mammogram status: No longer required due to age & patient declined.  Bone Density status: Declined  Lung Cancer Screening: (Low Dose CT Chest recommended if Age 78-80 years, 30 pack-year currently smoking OR have quit w/in 15years.) does not qualify.    Additional Screening:  Hepatitis C Screening: does not qualify  Vision Screening: Recommended annual ophthalmology exams for early detection of glaucoma and other disorders of the eye. Is the patient up to date with their annual eye exam?  Yes  Who is the provider or what is the name of the office in which the patient attends annual eye exams? [facility]   Dental Screening: Recommended annual dental exams for proper oral hygiene  Community Resource Referral / Chronic Care Management: CRR required this visit?  No   CCM required this visit?  No      Plan:     I have personally reviewed and  noted the following in the patient's chart:   . Medical and social history . Use of alcohol, tobacco or illicit drugs  . Current medications and supplements . Functional ability and status . Nutritional status . Physical activity . Advanced directives . List of other physicians . Hospitalizations, surgeries, and ER visits in previous 12 months . Vitals . Screenings to include cognitive, depression, and falls . Referrals and appointments  In addition, I have reviewed and discussed with patient certain preventive protocols, quality metrics, and best practice recommendations. A written personalized care plan for preventive services as well as general preventive health recommendations were provided to patient.   Due to this being a virtual visit, the after visit summary with patients personalized plan was offered to patient via mail or my-chart. Patient would like to access on my-chart.   Roanna Raider, LPN   [id number]  Nurse Health Advisor  Nurse Notes: None

## 2020-07-14 ENCOUNTER — Ambulatory Visit (INDEPENDENT_AMBULATORY_CARE_PROVIDER_SITE_OTHER): Payer: Medicare Other

## 2020-07-14 VITALS — Ht 60.0 in | Wt 117.0 lb

## 2020-07-14 DIAGNOSIS — Z0000 Encounter for general adult medical examination without abnormal findings: Secondary | ICD-10-CM

## 2020-07-14 NOTE — Patient Instructions (Signed)
[name] , Thank you for taking time to complete your Medicare Wellness Visit. I appreciate your ongoing commitment to your health goals. Please review the following plan we discussed and let me know if I can assist you in the future.   Screening recommendations/referrals: Colonoscopy: No longer required Mammogram: Declined Bone Density: Declined Recommended yearly ophthalmology/optometry visit for glaucoma screening and checkup Recommended yearly dental visit for hygiene and checkup  Vaccinations: Influenza vaccine: Up to date Pneumococcal vaccine: Completed vaccines Tdap vaccine: Discuss with pharmacy Shingles vaccine: Discuss with pharmacy  Covid-19:Completed vaccines  Advanced directives: Copy in chart  Conditions/risks identified: See problem list  Next appointment: Follow up in one year for your annual wellness visit 07/20/2021 @ 3:00   Preventive Care 65 Years and Older, Female Preventive care refers to lifestyle choices and visits with your health care provider that can promote health and wellness. What does preventive care include?  A yearly physical exam. This is also called an annual well check.  Dental exams once or twice a year.  Routine eye exams. Ask your health care provider how often you should have your eyes checked.  Personal lifestyle choices, including:  Daily care of your teeth and gums.  Regular physical activity.  Eating a healthy diet.  Avoiding tobacco and drug use.  Limiting alcohol use.  Practicing safe sex.  Taking low-dose aspirin every day.  Taking vitamin and mineral supplements as recommended by your health care provider. What happens during an annual well check? The services and screenings done by your health care provider during your annual well check will depend on your age, overall health, lifestyle risk factors, and family history of disease. Counseling  Your health care provider may ask you questions about your:  Alcohol  use.  Tobacco use.  Drug use.  Emotional well-being.  Home and relationship well-being.  Sexual activity.  Eating habits.  History of falls.  Memory and ability to understand (cognition).  Work and work Astronomer.  Reproductive health. Screening  You may have the following tests or measurements:  Height, weight, and BMI.  Blood pressure.  Lipid and cholesterol levels. These may be checked every 5 years, or more frequently if you are over 31 years old.  Skin check.  Lung cancer screening. You may have this screening every year starting at age 29 if you have a 30-pack-year history of smoking and currently smoke or have quit within the past 15 years.  Fecal occult blood test (FOBT) of the stool. You may have this test every year starting at age 51.  Flexible sigmoidoscopy or colonoscopy. You may have a sigmoidoscopy every 5 years or a colonoscopy every 10 years starting at age 47.  Hepatitis C blood test.  Hepatitis B blood test.  Sexually transmitted disease (STD) testing.  Diabetes screening. This is done by checking your blood sugar (glucose) after you have not eaten for a while (fasting). You may have this done every 1-3 years.  Bone density scan. This is done to screen for osteoporosis. You may have this done starting at age 23.  Mammogram. This may be done every 1-2 years. Talk to your health care provider about how often you should have regular mammograms. Talk with your health care provider about your test results, treatment options, and if necessary, the need for more tests. Vaccines  Your health care provider may recommend certain vaccines, such as:  Influenza vaccine. This is recommended every year.  Tetanus, diphtheria, and acellular pertussis (Tdap, Td) vaccine. You  may need a Td booster every 10 years.  Zoster vaccine. You may need this after age 33.  Pneumococcal 13-valent conjugate (PCV13) vaccine. One dose is recommended after age  42.  Pneumococcal polysaccharide (PPSV23) vaccine. One dose is recommended after age 59. Talk to your health care provider about which screenings and vaccines you need and how often you need them. This information is not intended to replace advice given to you by your health care provider. Make sure you discuss any questions you have with your health care provider. Document Released: 05/30/2015 Document Revised: 01/21/2016 Document Reviewed: 03/04/2015 Elsevier Interactive Patient Education  2017 [place] Prevention in the Home Falls can cause injuries. They can happen to people of all ages. There are many things you can do to make your home safe and to help prevent falls. What can I do on the outside of my home?  Regularly fix the edges of walkways and driveways and fix any cracks.  Remove anything that might make you trip as you walk through a door, such as a raised step or threshold.  Trim any bushes or trees on the path to your home.  Use bright outdoor lighting.  Clear any walking paths of anything that might make someone trip, such as rocks or tools.  Regularly check to see if handrails are loose or broken. Make sure that both sides of any steps have handrails.  Any raised decks and porches should have guardrails on the edges.  Have any leaves, snow, or ice cleared regularly.  Use sand or salt on walking paths during winter.  Clean up any spills in your garage right away. This includes oil or grease spills. What can I do in the bathroom?  Use night lights.  Install grab bars by the toilet and in the tub and shower. Do not use towel bars as grab bars.  Use non-skid mats or decals in the tub or shower.  If you need to sit down in the shower, use a plastic, non-slip stool.  Keep the floor dry. Clean up any water that spills on the floor as soon as it happens.  Remove soap buildup in the tub or shower regularly.  Attach bath mats securely with double-sided  non-slip rug tape.  Do not have throw rugs and other things on the floor that can make you trip. What can I do in the bedroom?  Use night lights.  Make sure that you have a light by your bed that is easy to reach.  Do not use any sheets or blankets that are too big for your bed. They should not hang down onto the floor.  Have a firm chair that has side arms. You can use this for support while you get dressed.  Do not have throw rugs and other things on the floor that can make you trip. What can I do in the kitchen?  Clean up any spills right away.  Avoid walking on wet floors.  Keep items that you use a lot in easy-to-reach places.  If you need to reach something above you, use a strong step stool that has a grab bar.  Keep electrical cords out of the way.  Do not use floor polish or wax that makes floors slippery. If you must use wax, use non-skid floor wax.  Do not have throw rugs and other things on the floor that can make you trip. What can I do with my stairs?  Do not leave any items  on the stairs.  Make sure that there are handrails on both sides of the stairs and use them. Fix handrails that are broken or loose. Make sure that handrails are as long as the stairways.  Check any carpeting to make sure that it is firmly attached to the stairs. Fix any carpet that is loose or worn.  Avoid having throw rugs at the top or bottom of the stairs. If you do have throw rugs, attach them to the floor with carpet tape.  Make sure that you have a light switch at the top of the stairs and the bottom of the stairs. If you do not have them, ask someone to add them for you. What else can I do to help prevent falls?  Wear shoes that:  Do not have high heels.  Have rubber bottoms.  Are comfortable and fit you well.  Are closed at the toe. Do not wear sandals.  If you use a stepladder:  Make sure that it is fully opened. Do not climb a closed stepladder.  Make sure that both  sides of the stepladder are locked into place.  Ask someone to hold it for you, if possible.  Clearly mark and make sure that you can see:  Any grab bars or handrails.  First and last steps.  Where the edge of each step is.  Use tools that help you move around (mobility aids) if they are needed. These include:  Canes.  Walkers.  Scooters.  Crutches.  Turn on the lights when you go into a dark area. Replace any light bulbs as soon as they burn out.  Set up your furniture so you have a clear path. Avoid moving your furniture around.  If any of your floors are uneven, fix them.  If there are any pets around you, be aware of where they are.  Review your medicines with your doctor. Some medicines can make you feel dizzy. This can increase your chance of falling. Ask your doctor what other things that you can do to help prevent falls. This information is not intended to replace advice given to you by your health care provider. Make sure you discuss any questions you have with your health care provider. Document Released: 02/27/2009 Document Revised: 10/09/2015 Document Reviewed: 06/07/2014 Elsevier Interactive Patient Education  2017 [employer].

## 2020-07-23 ENCOUNTER — Other Ambulatory Visit: Payer: Self-pay | Admitting: Family Medicine

## 2020-08-11 ENCOUNTER — Ambulatory Visit (INDEPENDENT_AMBULATORY_CARE_PROVIDER_SITE_OTHER): Payer: Medicare Other | Admitting: Neurology

## 2020-08-11 ENCOUNTER — Encounter: Payer: Self-pay | Admitting: Neurology

## 2020-08-11 ENCOUNTER — Other Ambulatory Visit: Payer: Self-pay

## 2020-08-11 VITALS — BP 133/66 | HR 89 | Ht 60.0 in | Wt 117.4 lb

## 2020-08-11 DIAGNOSIS — F0390 Unspecified dementia without behavioral disturbance: Secondary | ICD-10-CM

## 2020-08-11 MED ORDER — MEMANTINE HCL 10 MG PO TABS: 10.0000 mg | ORAL_TABLET | Freq: Two times a day (BID) | ORAL | 3 refills | Status: DC

## 2020-08-11 NOTE — Patient Instructions (Signed)
Increase Namenda to 10mg  twice daily  Do not recommend that you drive  Start taking vitamin B12 daily  Return to clinic 1 year

## 2020-08-11 NOTE — Progress Notes (Signed)
Follow-up Visit   Date: 08/11/20   [name] MRN: [id number] DOB: 02/26/1941   Interim History: [name] is a 80 y.o. Caucasian female returning to the clinic for follow-up of dementia.  The patient was accompanied to the clinic by husband who also provides collateral information.    Over the past year, husband has noticed mild progression of cognitive changes.  She continues to have difficulty with word-findings and has started to show difficulty performing tasks, such as writing checks, starting the coffee pot, playing CDs, etc.  Sometimes her mood can be low.  No SI.  She takes naps, gets bored.  She has a dog that keeps her company.  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    There is caregiver two days per week, 4 hours per day.  Husband has also decided to retire from being a pilot, so he may stay home and be support.  He has noticed that when he is away on overnight trips, she tends to get more anxious.   Medications:  Current Outpatient Medications on File Prior to Visit  Medication Sig Dispense Refill  . Aspirin-Caffeine (ANACIN PO) Take by mouth.    . chlorhexidine (PERIDEX) 0.12 % solution     . Cholecalciferol (VITAMIN D3) 1000 units CAPS Vitamin D3 1,000 unit (25 mcg) tablet   1 tablet every day by oral route.    Marland Kitchen HYDROcodone-acetaminophen (NORCO) 10-325 MG tablet Take 1 tablet by mouth daily as needed. Rx'ed by Dr. Ethelene Hal    . Lysine 500 MG CAPS lysine 500 mg tablet   1 tablet every day by oral route.    . memantine (NAMENDA) 10 MG tablet Take ONE-half (0.5) tablet (5mg ) BY MOUTH daily for one month, then increase to 1 tablet daily. 60 tablet 0  . Multiple Vitamins-Minerals (OCUVITE ADULT FORMULA) CAPS Take 1 capsule by mouth daily.    . pantoprazole (PROTONIX) 40 MG tablet Take 1 tablet (40 mg total) by mouth daily. 90 tablet 0  . Potassium 99 MG TABS potassium 99 mg tablet   1 tablet every day by oral route.    .  SUMAtriptan (IMITREX) 100 MG tablet TAKE 1 TABLET BY MOUTH FOR MIGRAINES. 9 tablet 0  . SYNTHROID 75 MCG tablet Take 1 tablet (75 mcg total) by mouth daily before breakfast. 90 tablet 0  . vitamin E 400 UNIT capsule daily.     No current facility-administered medications on file prior to visit.    Allergies:  Allergies  Allergen Reactions  . Terbinafine Hcl Hives  . Prolia [Denosumab] Rash  . Dorzolamide Hcl-Timolol Mal Other (See Comments)  . Thimerosal Swelling    Redness  . Timolol   . Lamisil  [Terbinafine] Rash    Vital Signs:  BP 133/66   Pulse 89   Ht 5' (1.524 m)   Wt 117 lb 6.4 oz (53.3 kg)   SpO2 98%   BMI 22.93 kg/m   Neurological Exam: MENTAL STATUS including orientation to person and place.  Year is unknown.  She cannot identify her home address or telephone number.  She correctly names her DOB, incorrect with husband's DOB.  Speech can be tangential at times. Speech is not dysarthric.  CRANIAL NERVES:  Normal conjugate, extra-ocular eye movements in all direction of gaze.  No ptosis.  Face is symmetric.   MOTOR:  Motor strength is 5/5 in all extremities  COORDINATION/GAIT:  Gait narrow based  and stable.   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  As expected, there has been some progression over the past year, especially with IADLs. In addition to word-findings difficulty, she has memory impairment and apraxia.              - increase Namenda to 10mg  BID  -1 Do not drive   - Continue mentally stimulating activities  - Home safety discussed  Return to clinic in 1 year.   Total time spent:  30 min  Thank you for allowing me to participate in patient's care.  If I can answer any additional questions, I would be pleased to do so.    Sincerely,    Odalys Win K. , DO

## 2020-08-12 ENCOUNTER — Other Ambulatory Visit: Payer: Self-pay | Admitting: Family Medicine

## 2020-08-14 DIAGNOSIS — R030 Elevated blood-pressure reading, without diagnosis of hypertension: Secondary | ICD-10-CM | POA: Insufficient documentation

## 2020-08-14 DIAGNOSIS — M419 Scoliosis, unspecified: Secondary | ICD-10-CM | POA: Insufficient documentation

## 2020-09-12 ENCOUNTER — Other Ambulatory Visit: Payer: Self-pay | Admitting: Family Medicine

## 2020-09-13 ENCOUNTER — Encounter: Payer: Self-pay | Admitting: Family Medicine

## 2020-09-15 ENCOUNTER — Other Ambulatory Visit: Payer: Self-pay | Admitting: Family Medicine

## 2020-09-15 ENCOUNTER — Other Ambulatory Visit: Payer: Self-pay

## 2020-09-15 MED ORDER — SUMATRIPTAN SUCCINATE 100 MG PO TABS: ORAL_TABLET | ORAL | 0 refills | Status: DC

## 2020-09-15 NOTE — Telephone Encounter (Signed)
LFD 08/12/20 #9 with no refills LOV 01/02/20 NOV none

## 2020-09-16 ENCOUNTER — Encounter: Payer: Self-pay | Admitting: Family Medicine

## 2020-09-16 ENCOUNTER — Other Ambulatory Visit: Payer: Self-pay

## 2020-09-16 DIAGNOSIS — K219 Gastro-esophageal reflux disease without esophagitis: Secondary | ICD-10-CM

## 2020-09-16 MED ORDER — PANTOPRAZOLE SODIUM 40 MG PO TBEC: 40.0000 mg | DELAYED_RELEASE_TABLET | Freq: Every day | ORAL | 0 refills | Status: DC

## 2020-09-17 ENCOUNTER — Telehealth (INDEPENDENT_AMBULATORY_CARE_PROVIDER_SITE_OTHER): Payer: Medicare Other | Admitting: Family Medicine

## 2020-09-17 ENCOUNTER — Encounter: Payer: Self-pay | Admitting: Family Medicine

## 2020-09-17 DIAGNOSIS — M5412 Radiculopathy, cervical region: Secondary | ICD-10-CM | POA: Diagnosis not present

## 2020-09-17 DIAGNOSIS — G4486 Cervicogenic headache: Secondary | ICD-10-CM

## 2020-09-17 NOTE — Progress Notes (Signed)
I connected with  [name] on 09/17/20 by a video enabled telemedicine application and verified that I am speaking with the correct person using two identifiers.   I discussed the limitations of evaluation and management by telemedicine. The patient expressed understanding and agreed to proceed.

## 2020-09-17 NOTE — Progress Notes (Signed)
Virtual Visit via Video   I connected with patient on 09/17/20 at 11:00 AM EDT by a video enabled telemedicine application and verified that I am speaking with the correct person using two identifiers.  Location patient: Home Location provider: [facility], [facility] Persons participating in the virtual visit: Patient, Provider, CMA (Sabrina M)  I discussed the limitations of evaluation and management by telemedicine and the availability of in person appointments. The patient expressed understanding and agreed to proceed.  Subjective:   HPI:   Migraines- pt is on Imitrex 100mg  as needed for migraines.  Husband reports he has been breaking Imitrex in half for most headaches but recently she has required a full tab.  HAs occurring 2-3x/week.  Seem to be associated w/ R sided neck pain.  Neurosurgery feels that neck injxns may be helpful for headache and pain relief.  HAs do not cause nausea or sensitivity to light/sound.  No relief w/ hydrocodone but + relief w/ Imitrex.  Pt reports good fluid intake.  + seasonal allergies and sinus headaches.  Taking Loratadine daily  ROS:   See pertinent positives and negatives per HPI.  Patient Active Problem List   Diagnosis Date Noted  . Elevated blood-pressure reading, without diagnosis of hypertension 08/14/2020  . Scoliosis of thoracolumbar spine 08/14/2020  . 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 Other idiopathic scoliosis, lumbar region 01/07/2017  . Spinal stenosis of lumbar region without neurogenic claudication 10/08/2016  . Chronic fatigue 03/13/2014  . Chronic pain syndrome 01/10/2014  . Lumbar back pain 11/06/2010  . Cervical radiculopathy 06/08/2010  . Osteoporosis 12/04/2008  . 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/17/2008  . 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 HEADACHE  04/17/2008  . URINARY INCONTINENCE 04/17/2008  . Osteoarthritis, multiple sites 02/16/2007  . Hypothyroidism 11/30/2006  . Fibromyalgia 11/30/2006    Social History   Tobacco Use  . Smoking status: Never Smoker  . Smokeless tobacco: Never Used  Substance Use Topics  . Alcohol use: No    Current Outpatient Medications:  .  Aspirin-Caffeine (ANACIN PO), Take by mouth., Disp: , Rfl:  .  chlorhexidine (PERIDEX) 0.12 % solution, , Disp: , Rfl:  .  Cholecalciferol (VITAMIN D3) 1000 units CAPS, Vitamin D3 1,000 unit (25 mcg) tablet   1 tablet every day by oral route., Disp: , Rfl:  .  HYDROcodone-acetaminophen (NORCO) 10-325 MG tablet, Take 1 tablet by mouth daily as needed. Rx'ed by Dr. 12/02/2006, Disp: , Rfl:  .  memantine (NAMENDA) 10 MG tablet, Take 1 tablet (10 mg total) by mouth 2 (two) times daily. Fill when pt requests., Disp: 180 tablet, Rfl: 3 .  Multiple Vitamins-Minerals (OCUVITE ADULT FORMULA) CAPS, Take 1 capsule by mouth daily., Disp: , Rfl:  .  pantoprazole (PROTONIX) 40 MG tablet, Take 1 tablet (40 mg total) by mouth daily., Disp: 90 tablet, Rfl: 0 .  Potassium 99 MG TABS, potassium 99 mg tablet   1 tablet every day by oral route., Disp: , Rfl:  .  SUMAtriptan (IMITREX) 100 MG tablet, May repeat in 2 hours if headache persists or recurs.  LAST REFILL WITHOUT AN APPT, Disp: 9 tablet, Rfl: 0 .  SYNTHROID 75 MCG tablet, Take 1 tablet (75 mcg total) by mouth daily before breakfast., Disp: 90 tablet, Rfl: 0 .  vitamin E 400 UNIT capsule, daily., Disp: , Rfl:  .  Lysine  500 MG CAPS, lysine 500 mg tablet   1 tablet every day by oral route. (Patient not taking: Reported on 09/17/2020), Disp: , Rfl:   Allergies  Allergen Reactions  . Terbinafine Hcl Hives  . Prolia [Denosumab] Rash  . Dorzolamide Hcl-Timolol Mal Other (See Comments)  . Thimerosal Swelling    Redness  . Timolol   . Lamisil  [Terbinafine] Rash    Objective:   There were no vitals taken for this visit. AAOx3,  NAD NCAT, EOMI No obvious CN deficits Coloring WNL Pt is able to speak clearly, coherently without shortness of breath or increased work of breathing.  Thought process is linear.  Mood is appropriate.   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  For whatever reason, her HAs don't respond to her regular pain medications but do respond to Imitrex and sleep.  She has already seen Neurosurg who has done an MRI of neck and is recommending injxns.  Hopefully this will improve the frequency and severity of headaches.  Will follow.   Neena Rhymes, MD 09/17/2020

## 2020-11-12 ENCOUNTER — Encounter: Payer: Self-pay | Admitting: *Deleted

## 2020-11-18 ENCOUNTER — Other Ambulatory Visit: Payer: Self-pay | Admitting: Family Medicine

## 2020-11-18 ENCOUNTER — Other Ambulatory Visit: Payer: Self-pay

## 2020-11-18 MED ORDER — SYNTHROID 75 MCG PO TABS: 75.0000 ug | ORAL_TABLET | Freq: Every day | ORAL | 0 refills | Status: DC

## 2020-11-25 ENCOUNTER — Other Ambulatory Visit: Payer: Self-pay | Admitting: Family Medicine

## 2020-12-03 ENCOUNTER — Other Ambulatory Visit: Payer: Self-pay

## 2020-12-03 ENCOUNTER — Encounter: Payer: Self-pay | Admitting: Family Medicine

## 2020-12-03 ENCOUNTER — Ambulatory Visit (INDEPENDENT_AMBULATORY_CARE_PROVIDER_SITE_OTHER): Payer: Medicare Other | Admitting: Family Medicine

## 2020-12-03 VITALS — BP 118/70 | HR 78 | Temp 97.4°F | Resp 19 | Ht 60.0 in | Wt 117.0 lb

## 2020-12-03 DIAGNOSIS — R413 Other amnesia: Secondary | ICD-10-CM | POA: Diagnosis not present

## 2020-12-03 DIAGNOSIS — E785 Hyperlipidemia, unspecified: Secondary | ICD-10-CM | POA: Diagnosis not present

## 2020-12-03 DIAGNOSIS — M810 Age-related osteoporosis without current pathological fracture: Secondary | ICD-10-CM | POA: Diagnosis not present

## 2020-12-03 DIAGNOSIS — K219 Gastro-esophageal reflux disease without esophagitis: Secondary | ICD-10-CM | POA: Diagnosis not present

## 2020-12-03 DIAGNOSIS — E039 Hypothyroidism, unspecified: Secondary | ICD-10-CM

## 2020-12-03 LAB — CBC WITH DIFFERENTIAL/PLATELET
Basophils Absolute: 0 10*3/uL (ref 0.0–0.1)
Basophils Relative: 0.4 % (ref 0.0–3.0)
Eosinophils Absolute: 0.1 10*3/uL (ref 0.0–0.7)
Eosinophils Relative: 1.5 % (ref 0.0–5.0)
HCT: 40.8 % (ref 36.0–46.0)
Hemoglobin: 13.9 g/dL (ref 12.0–15.0)
Lymphocytes Relative: 18.8 % (ref 12.0–46.0)
Lymphs Abs: 1.6 10*3/uL (ref 0.7–4.0)
MCHC: 34 g/dL (ref 30.0–36.0)
MCV: 93.2 fl (ref 78.0–100.0)
Monocytes Absolute: 0.6 10*3/uL (ref 0.1–1.0)
Monocytes Relative: 7.6 % (ref 3.0–12.0)
Neutro Abs: 6.1 10*3/uL (ref 1.4–7.7)
Neutrophils Relative %: 71.7 % (ref 43.0–77.0)
Platelets: 146 10*3/uL — ABNORMAL LOW (ref 150.0–400.0)
RBC: 4.38 Mil/uL (ref 3.87–5.11)
RDW: 13.6 % (ref 11.5–15.5)
WBC: 8.5 10*3/uL (ref 4.0–10.5)

## 2020-12-03 LAB — BASIC METABOLIC PANEL
BUN: 23 mg/dL (ref 6–23)
CO2: 28 mEq/L (ref 19–32)
Calcium: 9.6 mg/dL (ref 8.4–10.5)
Chloride: 103 mEq/L (ref 96–112)
Creatinine, Ser: 0.71 mg/dL (ref 0.40–1.20)
GFR: 80.05 mL/min (ref >60.00)
Glucose, Bld: 96 mg/dL (ref 70–99)
Potassium: 4.3 mEq/L (ref 3.5–5.1)
Sodium: 141 mEq/L (ref 135–145)

## 2020-12-03 LAB — TSH: TSH: 1.86 u[IU]/mL (ref 0.35–5.50)

## 2020-12-03 LAB — HEPATIC FUNCTION PANEL
ALT: 11 U/L (ref 0–35)
AST: 16 U/L (ref 0–37)
Albumin: 4.4 g/dL (ref 3.5–5.2)
Alkaline Phosphatase: 66 U/L (ref 39–117)
Bilirubin, Direct: 0.1 mg/dL (ref 0.0–0.3)
Total Bilirubin: 0.5 mg/dL (ref 0.2–1.2)
Total Protein: 6.4 g/dL (ref 6.0–8.3)

## 2020-12-03 LAB — LIPID PANEL
Cholesterol: 236 mg/dL — ABNORMAL HIGH (ref 0–200)
HDL: 70 mg/dL (ref >39.00)
LDL Cholesterol: 146 mg/dL — ABNORMAL HIGH (ref 0–99)
NonHDL: 166
Total CHOL/HDL Ratio: 3
Triglycerides: 100 mg/dL (ref 0.0–149.0)
VLDL: 20 mg/dL (ref 0.0–40.0)

## 2020-12-03 LAB — VITAMIN D 25 HYDROXY (VIT D DEFICIENCY, FRACTURES): VITD: 66.5 ng/mL (ref 30.00–100.00)

## 2020-12-03 MED ORDER — PANTOPRAZOLE SODIUM 40 MG PO TBEC: 40.0000 mg | DELAYED_RELEASE_TABLET | Freq: Every day | ORAL | 0 refills | Status: DC

## 2020-12-03 MED ORDER — SYNTHROID 75 MCG PO TABS: 75.0000 ug | ORAL_TABLET | Freq: Every day | ORAL | 0 refills | Status: DC

## 2020-12-03 NOTE — Patient Instructions (Signed)
Follow up in 6 months to recheck thyroid We'll notify you of your lab results and make any changes if needed Keep up the good work!  You look great! Call with any questions or concerns Stay Safe!  Stay Healthy! Have a great summer!!

## 2020-12-03 NOTE — Assessment & Plan Note (Signed)
Chronic problem.  Pt reports ongoing sleepiness but she has had issues w/ chronic fatigue for years.  Check labs.  Adjust meds prn

## 2020-12-03 NOTE — Assessment & Plan Note (Signed)
Last LDL 128.  Attempting to control w/ lifestyle rather than medication.  Particularly in the setting of her memory loss.  Will follow.

## 2020-12-03 NOTE — Assessment & Plan Note (Signed)
Ongoing issue for pt.  Has seen Dr Allena Katz and now on Namenda.  Husband reports a slow decline.  Will continue to follow.

## 2020-12-03 NOTE — Assessment & Plan Note (Signed)
sxs currently well controlled on pantoprazole.  Refill provided.

## 2020-12-03 NOTE — Progress Notes (Signed)
   Subjective:    Patient ID: [name], female    DOB: 13-Dec-1940, 80 y.o.   MRN: [id number]  HPI Hypothyroid- chronic problem.  On Synthroid daily.  Pt reports feeling 'sleepy' and feels this way 'all the time'.  Pt is sleeping well at night.  Hyperlipidemia- chronic problem.  Last LDL 128 and trying to control w/ lifestyle rather than medication.  No CP, SOB, abd pain, N/V.  GERD- needs refill on Protonix 40mg  daily.  Well controlled.  Memory loss- currently on Namenda and following w/ Dr .  Husband reports things are 'a slow deterioration'.  Due to memory loss, pt is not doing mammo or DEXA- 'it's not worthwhile'   Review of Systems For ROS see HPI   This visit occurred during the SARS-CoV-2 public health emergency.  Safety protocols were in place, including screening questions prior to the visit, additional usage of staff PPE, and extensive cleaning of exam room while observing appropriate contact time as indicated for disinfecting solutions.      Objective:   Physical Exam Vitals reviewed.  Constitutional:      General: She is not in acute distress.    Appearance: Normal appearance. She is well-developed. She is not ill-appearing.  HENT:     Head: Normocephalic and atraumatic.  Eyes:     Conjunctiva/sclera: Conjunctivae normal.     Pupils: Pupils are equal, round, and reactive to light.  Neck:     Thyroid: No thyromegaly.  Cardiovascular:     Rate and Rhythm: Normal rate and regular rhythm.     Pulses: Normal pulses.     Heart sounds: Normal heart sounds. No murmur heard. Pulmonary:     Effort: Pulmonary effort is normal. No respiratory distress.     Breath sounds: Normal breath sounds.  Abdominal:     General: There is no distension.     Palpations: Abdomen is soft.     Tenderness: There is no abdominal tenderness.  Musculoskeletal:     Cervical back: Normal range of motion and neck supple.     Right lower leg: No edema.     Left lower leg: No  edema.  Lymphadenopathy:     Cervical: No cervical adenopathy.  Skin:    General: Skin is warm and dry.  Neurological:     Mental Status: She is alert and oriented to person, place, and time.  Psychiatric:        Behavior: Behavior normal.          Assessment & Plan:

## 2020-12-03 NOTE — Assessment & Plan Note (Signed)
Pt is not interested in a bone density at this time.  Check Vit D and replete prn.

## 2021-01-15 ENCOUNTER — Other Ambulatory Visit: Payer: Self-pay | Admitting: Family Medicine

## 2021-01-15 NOTE — Telephone Encounter (Signed)
LFD 11/25/20 #9 with no refills LOV 12/03/20 NOV 06/01/21

## 2021-01-16 ENCOUNTER — Other Ambulatory Visit: Payer: Self-pay | Admitting: Family Medicine

## 2021-01-21 NOTE — Telephone Encounter (Signed)
LFD 01/15/21 #9 with no refills LOV 12/03/20 NOV 06/01/21

## 2021-02-25 ENCOUNTER — Other Ambulatory Visit: Payer: Self-pay | Admitting: Family Medicine

## 2021-03-06 ENCOUNTER — Other Ambulatory Visit: Payer: Self-pay | Admitting: Family Medicine

## 2021-03-11 ENCOUNTER — Telehealth (INDEPENDENT_AMBULATORY_CARE_PROVIDER_SITE_OTHER): Payer: Medicare Other | Admitting: Family Medicine

## 2021-03-11 ENCOUNTER — Other Ambulatory Visit: Payer: Self-pay

## 2021-03-11 ENCOUNTER — Encounter: Payer: Self-pay | Admitting: Family Medicine

## 2021-03-11 VITALS — BP 126/76 | HR 72

## 2021-03-11 DIAGNOSIS — L03011 Cellulitis of right finger: Secondary | ICD-10-CM | POA: Diagnosis not present

## 2021-03-11 MED ORDER — DOXYCYCLINE HYCLATE 100 MG PO TABS: 100.0000 mg | ORAL_TABLET | Freq: Two times a day (BID) | ORAL | 0 refills | Status: DC

## 2021-03-11 NOTE — Progress Notes (Signed)
I connected with  [name] on 03/11/21 by a video enabled telemedicine application and verified that I am speaking with the correct person using two identifiers.   I discussed the limitations of evaluation and management by telemedicine. The patient expressed understanding and agreed to proceed.

## 2021-03-11 NOTE — Progress Notes (Signed)
Virtual Visit via Video   I connected with patient on 03/11/21 at  1:30 PM EDT by a video enabled telemedicine application and verified that I am speaking with the correct person using two identifiers.  Location patient: Home Location provider: [facility], [facility] Persons participating in the virtual visit: Patient, Provider, CMA Tresa Endo C)  I discussed the limitations of evaluation and management by telemedicine and the availability of in person appointments. The patient expressed understanding and agreed to proceed.  Subjective:   HPI:   Infected thumb- R thumb, redness and swelling around the nail.  Some visible pus.  Sxs started ~1 month ago. Very painful.  No drainage as of yet  ROS:   See pertinent positives and negatives per HPI.  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/17/2008   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 06/17/2008   Headache 04/17/2008   URINARY INCONTINENCE 04/17/2008   Osteoarthritis, multiple sites 02/16/2007   Hypothyroidism 11/30/2006   GERD (gastroesophageal reflux disease) 11/30/2006   Fibromyalgia 11/30/2006    Social History   Tobacco Use   Smoking status: Never   Smokeless tobacco: Never  Substance Use Topics   Alcohol use: No    Current Outpatient Medications:     Aspirin-Caffeine (ANACIN PO), Take by mouth., Disp: , Rfl:    chlorhexidine (PERIDEX) 0.12 % solution, , Disp: , Rfl:    Cholecalciferol (VITAMIN D3) 1000 units CAPS, Vitamin D3 1,000 unit (25 mcg) tablet   1 tablet every day by oral route., Disp: , Rfl:    HYDROcodone-acetaminophen (NORCO) 10-325 MG tablet, Take 1 tablet by mouth daily as needed. Rx'ed by Dr. Ethelene Hal, Disp: , Rfl:    memantine (NAMENDA) 10 MG tablet, Take 1 tablet (10 mg total) by mouth 2 (two) times daily. Fill when pt requests., Disp: 180 tablet, Rfl: 3   Multiple Vitamins-Minerals (OCUVITE ADULT FORMULA) CAPS, Take 1 capsule by mouth daily., Disp: , Rfl:    pantoprazole (PROTONIX) 40 MG tablet, Take 1 tablet (40 mg total) by mouth daily., Disp: 90 tablet, Rfl: 0   Potassium 99 MG TABS, potassium 99 mg tablet   1 tablet every day by oral route., Disp: , Rfl:    SUMAtriptan (IMITREX) 100 MG tablet, TAKE 1 TABLET BY MOUTH FOR MIGRAINES., Disp: 9 tablet, Rfl: 0   SYNTHROID 75 MCG tablet, Take 1 tablet (75 mcg total) by mouth daily before breakfast., Disp: 90 tablet, Rfl: 0   vitamin E 400 UNIT capsule, daily., Disp: , Rfl:   Allergies  Allergen Reactions   Terbinafine Hcl Hives   Prolia [Denosumab] Rash   Dorzolamide Hcl-Timolol Mal Other (See Comments)   Thimerosal Swelling    Redness   Timolol    Lamisil  [Terbinafine] Rash    Objective:   BP 126/76 Comment: patient reported  Pulse 72 Comment: pt reported  SpO2 99% Comment: pt reported AAOx3, NAD NCAT, EOMI No obvious CN  deficits Coloring WNL Pt is able to speak clearly, coherently without shortness of breath or increased work of breathing.  R thumb w/ visible paronychia Thought process is linear.  Mood is appropriate.   Assessment and Plan:   Paronychia- new.  R thumb.  Given duration of symptoms will start Doxy.  Encouraged them to do warm Epsom salt soaks.  Pt expressed understanding and is in agreement w/ plan.    Neena Rhymes, MD 03/11/2021

## 2021-04-20 ENCOUNTER — Other Ambulatory Visit: Payer: Self-pay | Admitting: Family Medicine

## 2021-04-20 DIAGNOSIS — K219 Gastro-esophageal reflux disease without esophagitis: Secondary | ICD-10-CM

## 2021-05-04 ENCOUNTER — Other Ambulatory Visit: Payer: Self-pay | Admitting: Family Medicine

## 2021-05-23 ENCOUNTER — Other Ambulatory Visit: Payer: Self-pay | Admitting: Family Medicine

## 2021-05-30 ENCOUNTER — Other Ambulatory Visit: Payer: Self-pay | Admitting: Neurology

## 2021-06-01 ENCOUNTER — Ambulatory Visit: Payer: Medicare Other | Admitting: Family Medicine

## 2021-06-03 ENCOUNTER — Ambulatory Visit (INDEPENDENT_AMBULATORY_CARE_PROVIDER_SITE_OTHER): Payer: Medicare Other | Admitting: Family Medicine

## 2021-06-03 ENCOUNTER — Encounter: Payer: Self-pay | Admitting: Family Medicine

## 2021-06-03 VITALS — BP 122/82 | HR 74 | Temp 98.0°F | Resp 16 | Wt 119.4 lb

## 2021-06-03 DIAGNOSIS — E039 Hypothyroidism, unspecified: Secondary | ICD-10-CM | POA: Diagnosis not present

## 2021-06-03 DIAGNOSIS — M810 Age-related osteoporosis without current pathological fracture: Secondary | ICD-10-CM | POA: Diagnosis not present

## 2021-06-03 DIAGNOSIS — E785 Hyperlipidemia, unspecified: Secondary | ICD-10-CM

## 2021-06-03 LAB — CBC WITH DIFFERENTIAL/PLATELET
Basophils Absolute: 0 10*3/uL (ref 0.0–0.1)
Basophils Relative: 0.6 % (ref 0.0–3.0)
Eosinophils Absolute: 0.1 10*3/uL (ref 0.0–0.7)
Eosinophils Relative: 1.6 % (ref 0.0–5.0)
HCT: 39.8 % (ref 36.0–46.0)
Hemoglobin: 13.1 g/dL (ref 12.0–15.0)
Lymphocytes Relative: 28.9 % (ref 12.0–46.0)
Lymphs Abs: 2.1 10*3/uL (ref 0.7–4.0)
MCHC: 32.8 g/dL (ref 30.0–36.0)
MCV: 93.1 fl (ref 78.0–100.0)
Monocytes Absolute: 0.4 10*3/uL (ref 0.1–1.0)
Monocytes Relative: 5.2 % (ref 3.0–12.0)
Neutro Abs: 4.6 10*3/uL (ref 1.4–7.7)
Neutrophils Relative %: 63.7 % (ref 43.0–77.0)
Platelets: 148 10*3/uL — ABNORMAL LOW (ref 150.0–400.0)
RBC: 4.28 Mil/uL (ref 3.87–5.11)
RDW: 13.7 % (ref 11.5–15.5)
WBC: 7.2 10*3/uL (ref 4.0–10.5)

## 2021-06-03 LAB — BASIC METABOLIC PANEL
BUN: 29 mg/dL — ABNORMAL HIGH (ref 6–23)
CO2: 28 mEq/L (ref 19–32)
Calcium: 9.1 mg/dL (ref 8.4–10.5)
Chloride: 105 mEq/L (ref 96–112)
Creatinine, Ser: 0.64 mg/dL (ref 0.40–1.20)
GFR: 82.91 mL/min (ref >60.00)
Glucose, Bld: 79 mg/dL (ref 70–99)
Potassium: 4.2 mEq/L (ref 3.5–5.1)
Sodium: 140 mEq/L (ref 135–145)

## 2021-06-03 LAB — LIPID PANEL
Cholesterol: 243 mg/dL — ABNORMAL HIGH (ref 0–200)
HDL: 66.6 mg/dL (ref >39.00)
LDL Cholesterol: 158 mg/dL — ABNORMAL HIGH (ref 0–99)
NonHDL: 176.3
Total CHOL/HDL Ratio: 4
Triglycerides: 93 mg/dL (ref 0.0–149.0)
VLDL: 18.6 mg/dL (ref 0.0–40.0)

## 2021-06-03 LAB — TSH: TSH: 1.15 u[IU]/mL (ref 0.35–5.50)

## 2021-06-03 LAB — VITAMIN D 25 HYDROXY (VIT D DEFICIENCY, FRACTURES): VITD: 57.63 ng/mL (ref 30.00–100.00)

## 2021-06-03 LAB — HEPATIC FUNCTION PANEL
ALT: 10 U/L (ref 0–35)
AST: 15 U/L (ref 0–37)
Albumin: 4.3 g/dL (ref 3.5–5.2)
Alkaline Phosphatase: 53 U/L (ref 39–117)
Bilirubin, Direct: 0.1 mg/dL (ref 0.0–0.3)
Total Bilirubin: 0.6 mg/dL (ref 0.2–1.2)
Total Protein: 6.4 g/dL (ref 6.0–8.3)

## 2021-06-03 NOTE — Assessment & Plan Note (Signed)
Chronic problem.  Pt asymptomatic w/ exception of fatigue- which may be more dementia related than thyroid.  Check labs.  Adjust meds prn

## 2021-06-03 NOTE — Progress Notes (Signed)
° °  Subjective:    Patient ID: [name], female    DOB: [birth year], 81 y.o.   MRN: [id number]  HPI Hypothyroid- chronic problem.  On Synthroid daily.  + fatigue.  Denies HAs, changes to skin/hair/nails  Hyperlipidemia- ongoing issue.  Pt has been resistant to cholesterol medicine in the past.  Last LDL 146.  No CP, SOB, abd pain, N/V.  Osteoporosis- pt has hx of this.  Is no longer doing DEXA scans.  Had allergic rxn to Prolia and is not on any prescription medication.  Takes daily Vit D.  Review of Systems For ROS see HPI   This visit occurred during the SARS-CoV-2 public health emergency.  Safety protocols were in place, including screening questions prior to the visit, additional usage of staff PPE, and extensive cleaning of exam room while observing appropriate contact time as indicated for disinfecting solutions.      Objective:   Physical Exam Vitals reviewed.  Constitutional:      General: She is not in acute distress.    Appearance: Normal appearance. She is well-developed. She is not ill-appearing.  HENT:     Head: Normocephalic and atraumatic.  Eyes:     Conjunctiva/sclera: Conjunctivae normal.     Pupils: Pupils are equal, round, and reactive to light.  Neck:     Thyroid: No thyromegaly.  Cardiovascular:     Rate and Rhythm: Normal rate and regular rhythm.     Pulses: Normal pulses.     Heart sounds: Normal heart sounds. No murmur heard. Pulmonary:     Effort: Pulmonary effort is normal. No respiratory distress.     Breath sounds: Normal breath sounds.  Abdominal:     General: There is no distension.     Palpations: Abdomen is soft.     Tenderness: There is no abdominal tenderness.  Musculoskeletal:     Cervical back: Normal range of motion and neck supple.     Right lower leg: No edema.     Left lower leg: No edema.  Lymphadenopathy:     Cervical: No cervical adenopathy.  Skin:    General: Skin is warm and dry.  Neurological:     Mental Status:  She is alert. Mental status is at baseline.     Comments: Oriented to person and place  Psychiatric:        Mood and Affect: Mood normal.        Behavior: Behavior normal.          Assessment & Plan:

## 2021-06-03 NOTE — Assessment & Plan Note (Signed)
Given her dementia, she and her husband have elected to stop all health maintenance screening.  Check Vit D.  Replete prn.

## 2021-06-03 NOTE — Patient Instructions (Signed)
Follow up in 1 year or as needed We'll notify you of your lab results and make any changes if needed Continue to make healthy food choices and get regular physical activity Try moving the Pantoprazole to evening Call with any questions or concerns Stay Safe!  Stay Healthy! Happy New Year!!

## 2021-06-03 NOTE — Assessment & Plan Note (Signed)
Ongoing issue for pt.  Given her age and her current state of dementia, cholesterol would have to dramatically spike to consider medication.  Check labs and adjust plan prn.

## 2021-06-04 ENCOUNTER — Telehealth: Payer: Self-pay

## 2021-06-04 NOTE — Telephone Encounter (Signed)
-----   Message from Sheliah Hatch, MD sent at 06/04/2021  7:47 AM EST ----- Total cholesterol and LDL (bad cholesterol) have increased somewhat but not to the point that I would start medication.  No changes at this time.  Remainder of labs look great!

## 2021-06-04 NOTE — Telephone Encounter (Signed)
Husband is aware of the labs

## 2021-06-08 ENCOUNTER — Other Ambulatory Visit: Payer: Self-pay | Admitting: Family Medicine

## 2021-07-08 ENCOUNTER — Encounter: Payer: Self-pay | Admitting: Family Medicine

## 2021-07-17 ENCOUNTER — Other Ambulatory Visit: Payer: Self-pay | Admitting: Family Medicine

## 2021-07-17 DIAGNOSIS — K219 Gastro-esophageal reflux disease without esophagitis: Secondary | ICD-10-CM

## 2021-07-20 ENCOUNTER — Ambulatory Visit: Payer: Medicare Other

## 2021-08-14 ENCOUNTER — Ambulatory Visit (INDEPENDENT_AMBULATORY_CARE_PROVIDER_SITE_OTHER): Payer: Medicare Other | Admitting: Neurology

## 2021-08-14 ENCOUNTER — Encounter: Payer: Self-pay | Admitting: Neurology

## 2021-08-14 VITALS — BP 136/83 | HR 82 | Ht 60.0 in | Wt 119.0 lb

## 2021-08-14 DIAGNOSIS — F0280 Dementia in other diseases classified elsewhere without behavioral disturbance: Secondary | ICD-10-CM | POA: Diagnosis not present

## 2021-08-14 DIAGNOSIS — G309 Alzheimer's disease, unspecified: Secondary | ICD-10-CM | POA: Diagnosis not present

## 2021-08-14 MED ORDER — SERTRALINE HCL 25 MG PO TABS: 25.0000 mg | ORAL_TABLET | Freq: Every day | ORAL | 5 refills | Status: DC

## 2021-08-14 MED ORDER — MEMANTINE HCL 10 MG PO TABS
10.0000 mg | ORAL_TABLET | Freq: Two times a day (BID) | ORAL | 3 refills | Status: DC
Start: 2021-08-14 — End: 2022-08-16

## 2021-08-14 NOTE — Patient Instructions (Signed)
Start zoloft 25mg  daily ? ?Continue Namenda 10mg  twice daily ? ?Return to clinic in 1 year ?

## 2021-08-14 NOTE — Progress Notes (Signed)
? ? ?Follow-up Visit ? ? ?Date: 08/14/21 ? ? ?[name] ?MRN: [id number] ?DOB: 08/10/1940 ? ? ?Interim History: ?[name] is a 81 y.o. Caucasian female returning to the clinic for follow-up of dementia.  The patient was accompanied to the clinic by husband who also provides collateral information.   ? ?Over the past year, there has been a steady decline in cognition and independent functioning.  Husband has noticed that she is forgetful and he tends to assist her with bathing and dressing sometimes. She continues to have difficulty with performing tasks and following instructions, so he tries to take care of everything as best as he can.  He managed medications and meals.  ? ?Husband is able to leave for about an hour, but despite leaving a note of where he was gone, she still asks him repeatedly or sometimes thinks he has been away all day.  Anything more than an hour, he tries to make sure there is caregiver to supervise.  She has a caregiver who comes twice per week, 4 hours per day.   ? ?She tends to get bored and spends a significant amount of time sleeping.  She is sleeping well during the night.  Her mood can be low at times, sometimes saying that she wishes she were dead, but this is momentary and does not have any plan or means.   ? ? ?Medications:  ?Current Outpatient Medications on File Prior to Visit  ?Medication Sig Dispense Refill  ? Aspirin-Caffeine (ANACIN PO) Take by mouth.    ? chlorhexidine (PERIDEX) 0.12 % solution     ? Cholecalciferol (VITAMIN D3) 1000 units CAPS Vitamin D3 1,000 unit (25 mcg) tablet ?  1 tablet every day by oral route.    ? HYDROcodone-acetaminophen (NORCO) 10-325 MG tablet Take 1 tablet by mouth daily as needed. Rx'ed by Dr. Ethelene Hal    ? memantine (NAMENDA) 10 MG tablet Take 1 tablet (10 mg total) by mouth 2 (two) times daily. 180 tablet 0  ? Multiple Vitamins-Minerals (OCUVITE ADULT FORMULA) CAPS Take 1 capsule by mouth daily.    ? pantoprazole (PROTONIX) 40 MG  tablet Take 1 tablet (40 mg total) by mouth daily. 90 tablet 0  ? Potassium 99 MG TABS potassium 99 mg tablet ?  1 tablet every day by oral route.    ? SUMAtriptan (IMITREX) 100 MG tablet TAKE 1 TABLET BY MOUTH FOR MIGRAINES. 9 tablet 0  ? SYNTHROID 75 MCG tablet Take 1 tablet (75 mcg total) by mouth daily before breakfast. 90 tablet 0  ? vitamin E 400 UNIT capsule daily.    ? ?No current facility-administered medications on file prior to visit.  ? ? ?Allergies:  ?Allergies  ?Allergen Reactions  ? Terbinafine Hcl Hives  ? Prolia [Denosumab] Rash  ? Dorzolamide Hcl-Timolol Mal Other (See Comments)  ? Thimerosal Swelling  ?  Redness  ? Timolol   ? Lamisil  [Terbinafine] Rash  ? ? ?Vital Signs:  ?BP 136/83   Pulse 82   Ht 5' (1.524 m)   Wt 119 lb (54 kg)   SpO2 100%   BMI 23.24 kg/m?  ? ?Neurological Exam: ?MENTAL STATUS including orientation to person and place.  Year is unknown.  She can answer simple questions.  Speech can be tangential at times. Speech is not dysarthric. ? ?CRANIAL NERVES:  Normal conjugate, extra-ocular eye movements in all direction of gaze.  No ptosis.  Face is symmetric.  ? ?MOTOR:  Motor strength is  5/5 in all extremities ? ?COORDINATION/GAIT:  Gait somewhat unsteady, unassisted.  ? ?Data: n/a ? ?IMPRESSION/PLAN: ?Alzheimer's dementia.  There has been progression over the past year now requiring some assistance with ADLs and completely dependent for IADLs.  Excellent support from her husband. Her mood has been down and I would like to offer SSRI to see if this will help. ? - Continue Namenda 10mg  twice daily ? - Previously did not tolerate Aricept ? - Start zoloft 25mg  daily  ? - Encouraged to try to engage in mentally stimulating activities.  ? - PT for balance declined ? ?Return to clinic in 1 year ? ? ?Thank you for allowing me to participate in patient's care.  If I can answer any additional questions, I would be pleased to do so.   ? ?Sincerely, ? ? ? ?Dajuana Palen K. , DO ? ? ?

## 2021-08-15 ENCOUNTER — Other Ambulatory Visit: Payer: Self-pay | Admitting: Family Medicine

## 2021-09-08 ENCOUNTER — Encounter: Payer: Self-pay | Admitting: Neurology

## 2021-09-21 ENCOUNTER — Other Ambulatory Visit: Payer: Self-pay | Admitting: Family Medicine

## 2021-10-16 ENCOUNTER — Other Ambulatory Visit: Payer: Self-pay | Admitting: Family Medicine

## 2021-10-16 DIAGNOSIS — K219 Gastro-esophageal reflux disease without esophagitis: Secondary | ICD-10-CM

## 2021-10-22 ENCOUNTER — Other Ambulatory Visit: Payer: Self-pay | Admitting: Family Medicine

## 2021-11-12 ENCOUNTER — Other Ambulatory Visit: Payer: Self-pay | Admitting: Neurology

## 2021-12-02 ENCOUNTER — Other Ambulatory Visit: Payer: Self-pay | Admitting: Family Medicine

## 2021-12-21 ENCOUNTER — Encounter: Payer: Self-pay | Admitting: Neurology

## 2021-12-30 ENCOUNTER — Telehealth: Payer: Self-pay | Admitting: Family Medicine

## 2021-12-30 NOTE — Telephone Encounter (Signed)
Caller name: Kathlene November   On DPR? :yes/no: Yes  Call back number: [phone]  Provider they see: Beverely Low  Reason for call: Is going to see Dr. Ethelene Hal for pain, but is having incontinence issues and husband wants to know what to do about cloudy urine. No, pain, itching. Could maybe come in just for labs? Please advise. Kathlene November [phone]

## 2021-12-30 NOTE — Telephone Encounter (Signed)
Pt is scheduled °

## 2021-12-30 NOTE — Telephone Encounter (Signed)
Pt has not been seen since January would recommend OV dont know what labs we would need to order without visit

## 2022-01-01 ENCOUNTER — Ambulatory Visit (INDEPENDENT_AMBULATORY_CARE_PROVIDER_SITE_OTHER): Payer: Medicare Other | Admitting: Family Medicine

## 2022-01-01 ENCOUNTER — Encounter: Payer: Self-pay | Admitting: Family Medicine

## 2022-01-01 ENCOUNTER — Other Ambulatory Visit: Payer: Self-pay | Admitting: *Deleted

## 2022-01-01 VITALS — BP 116/66 | HR 97 | Temp 97.3°F | Resp 18 | Ht 60.0 in | Wt 122.0 lb

## 2022-01-01 DIAGNOSIS — R8290 Unspecified abnormal findings in urine: Secondary | ICD-10-CM

## 2022-01-01 DIAGNOSIS — R350 Frequency of micturition: Secondary | ICD-10-CM | POA: Diagnosis not present

## 2022-01-01 LAB — POCT URINALYSIS DIPSTICK
Nitrite, UA: NEGATIVE
Protein, UA: NEGATIVE
Spec Grav, UA: 1.01 (ref 1.010–1.025)
Urobilinogen, UA: 0.2 E.U./dL
pH, UA: 6.5 (ref 5.0–8.0)

## 2022-01-01 MED ORDER — CEPHALEXIN 500 MG PO CAPS
500.0000 mg | ORAL_CAPSULE | Freq: Two times a day (BID) | ORAL | 0 refills | Status: AC
Start: 2022-01-01 — End: 2022-01-06

## 2022-01-01 NOTE — Progress Notes (Signed)
   Subjective:    Patient ID: [name], female    DOB: [birth year], 81 y.o.   MRN: [id number]  HPI Cloudy urine- pt denies burning or itching.  She had a urine sample done for Dr Ethelene Hal for pain management and it was very cloudy.  She is incontinent at baseline.  Difficulty to determine frequency or urgency.  Denies fever, chills.   Review of Systems For ROS see HPI     Objective:   Physical Exam Vitals reviewed.  Constitutional:      General: She is not in acute distress.    Appearance: Normal appearance. She is not ill-appearing.  Abdominal:     General: There is no distension.     Palpations: Abdomen is soft.     Tenderness: There is no abdominal tenderness. There is no guarding or rebound.  Skin:    General: Skin is warm and dry.  Neurological:     Mental Status: She is alert. Mental status is at baseline.  Psychiatric:        Mood and Affect: Mood normal.        Behavior: Behavior normal.           Assessment & Plan:  Cloudy urine- UA consistent w/ infection.  Pt is asymptomatic but not reliable due to ongoing dementia.  Start Keflex and await culture results.  Pt's husband expressed understanding and is in agreement w/ plan.

## 2022-01-01 NOTE — Patient Instructions (Signed)
Follow up as needed or as scheduled START the Cephalexin twice daily for presumed UTI Drink LOTS of fluids to flush the bladder Call with any questions or concerns Hang in there!!!

## 2022-01-01 NOTE — Patient Outreach (Signed)
  Care Coordination   01/01/2022 Name: [name] MRN: [id number] DOB: May 23, 1940   Care Coordination Outreach Attempts:  An unsuccessful telephone outreach was attempted today to offer the patient information about available care coordination services as a benefit of their health plan.   Follow Up Plan:  Additional outreach attempts will be made to offer the patient care coordination information and services.   Encounter Outcome:  No Answer  Care Coordination Interventions Activated:  No   Care Coordination Interventions:  No, not indicated    Elliot Cousin, RN Care Management Coordinator [facility] Main Office [phone]

## 2022-01-04 LAB — URINE CULTURE
MICRO NUMBER:: 13800473
SPECIMEN QUALITY:: ADEQUATE

## 2022-01-05 NOTE — Progress Notes (Signed)
Informed pt of the test results and let him know (he takes care of medical things for his wife) that if she finishes the medication and still sees signs of infection to make another appointment

## 2022-01-06 ENCOUNTER — Ambulatory Visit: Payer: Self-pay

## 2022-01-06 NOTE — Patient Outreach (Signed)
  Care Coordination   Initial Visit Note   01/06/2022 Name: [name] MRN: [id number] DOB: 08-01-1940  [name] is a 81 y.o. year old female who sees Tabori, Helane Rima, MD for primary care. I  spoke with patients spouse and caregiver [name] by phone.  What matters to the patients health and wellness today?  Questions if the cost of incontinent supplies are covered under health plan and/or tax deductible    Goals Addressed             This Visit's Progress    COMPLETED: Care Coordination Activities       Care Coordination Interventions: SDoH screening performed - no acute resource needs identified at this time Performed chart review to note patient was recently treated for a UTI - confirmed patient did complete antibiotics, no signs of ongoing infection notes by spouse/caregiver Casimiro Needle Discussed the patient lives in the home with her husband Casimiro Needle who is the primary caregiver. Patient does have Dementia and has a caregiver visit the home a couple times a week Education provided on the role of Hospice care as the patients dementia progresses and benefits offered including providing patients with incontinent supplies Reviewed the role of the care coordination team with Casimiro Needle - no follow up needed a this time Encouraged Casimiro Needle to contact the patients primary care provider as needed        SDOH assessments and interventions completed:  Yes  SDOH Interventions Today    Flowsheet Row Most Recent Value  SDOH Interventions   Food Insecurity Interventions Intervention Not Indicated  Housing Interventions Intervention Not Indicated  Transportation Interventions Intervention Not Indicated        Care Coordination Interventions Activated:  Yes  Care Coordination Interventions:  Yes, provided   Follow up plan: No further intervention required.   Encounter Outcome:  Pt. Visit Completed   Bevelyn Ngo, BSW, CDP Social Worker, Certified Dementia  Practitioner Care Coordination [phone]

## 2022-01-06 NOTE — Patient Instructions (Signed)
Visit Information  Thank you for taking time to visit with me today. Please don't hesitate to contact me if I can be of assistance to you.   Following are the goals we discussed today:   Goals Addressed             This Visit's Progress    COMPLETED: Care Coordination Activities       Care Coordination Interventions: SDoH screening performed - no acute resource needs identified at this time Performed chart review to note patient was recently treated for a UTI - confirmed patient did complete antibiotics, no signs of ongoing infection notes by spouse/caregiver Casimiro Needle Discussed the patient lives in the home with her husband Casimiro Needle who is the primary caregiver. Patient does have Dementia and has a caregiver visit the home a couple times a week Education provided on the role of Hospice care as the patients dementia progresses and benefits offered including providing patients with incontinent supplies Reviewed the role of the care coordination team with Casimiro Needle - no follow up needed a this time Encouraged Casimiro Needle to contact the patients primary care provider as needed        Please call the care guide team at [phone] if you need to schedule an appointment with our care coordination team.  If you are experiencing a Mental Health or Behavioral Health Crisis or need someone to talk to, please go to [facility] [address] [place] [phone])  Patient verbalizes understanding of instructions and care plan provided today and agrees to view in MyChart. Active MyChart status and patient understanding of how to access instructions and care plan via MyChart confirmed with patient.     No further follow up required: Please contact your primary care provider as needed.  Bevelyn Ngo, BSW, CDP Social Worker, Certified Dementia Practitioner Care Coordination [phone]

## 2022-01-14 ENCOUNTER — Other Ambulatory Visit: Payer: Self-pay | Admitting: Family Medicine

## 2022-01-14 DIAGNOSIS — K219 Gastro-esophageal reflux disease without esophagitis: Secondary | ICD-10-CM

## 2022-01-14 MED ORDER — SERTRALINE HCL 50 MG PO TABS: 50.0000 mg | ORAL_TABLET | Freq: Every day | ORAL | 3 refills | Status: DC

## 2022-02-16 ENCOUNTER — Other Ambulatory Visit: Payer: Self-pay | Admitting: Family Medicine

## 2022-02-17 ENCOUNTER — Encounter: Payer: Self-pay | Admitting: Family Medicine

## 2022-02-22 ENCOUNTER — Ambulatory Visit (INDEPENDENT_AMBULATORY_CARE_PROVIDER_SITE_OTHER): Payer: Medicare Other | Admitting: Family Medicine

## 2022-02-22 ENCOUNTER — Encounter: Payer: Self-pay | Admitting: Family Medicine

## 2022-02-22 VITALS — BP 112/70 | HR 74 | Temp 97.8°F | Resp 16 | Ht 60.0 in | Wt 121.1 lb

## 2022-02-22 DIAGNOSIS — R350 Frequency of micturition: Secondary | ICD-10-CM

## 2022-02-22 DIAGNOSIS — Z23 Encounter for immunization: Secondary | ICD-10-CM | POA: Diagnosis not present

## 2022-02-22 LAB — POCT URINALYSIS DIPSTICK
Glucose, UA: NEGATIVE
Leukocytes, UA: NEGATIVE
Protein, UA: NEGATIVE
Spec Grav, UA: 1.025 (ref 1.010–1.025)
Urobilinogen, UA: 0.2 E.U./dL
pH, UA: 8 (ref 5.0–8.0)

## 2022-02-22 NOTE — Progress Notes (Signed)
   Subjective:    Patient ID: [name], female    DOB: Apr 23, 1941, 81 y.o.   MRN: [id number]  HPI Urinary frequency- husband reports worsening urinary incontinence.  Pt wears Depends regularly but at times will forget and then have accidents.  Husband is wondering if pt needs medication to slow her bladder but is fearful of side effects.   Review of Systems For ROS see HPI     Objective:   Physical Exam Vitals reviewed.  Constitutional:      General: She is not in acute distress.    Appearance: Normal appearance. She is not ill-appearing.  HENT:     Head: Normocephalic and atraumatic.  Eyes:     Extraocular Movements: Extraocular movements intact.     Conjunctiva/sclera: Conjunctivae normal.     Pupils: Pupils are equal, round, and reactive to light.  Skin:    General: Skin is warm and dry.  Neurological:     Mental Status: She is alert. Mental status is at baseline. She is disoriented.  Psychiatric:        Mood and Affect: Mood normal.        Behavior: Behavior normal.        Thought Content: Thought content normal.           Assessment & Plan:   Urinary frequency- pt is excellently cared for and husband does a wonderful job of keeping her clean and dry.  Discussed bladder medications and anticholinergic side effects such as dizziness, confusion, risk of falls.  Husband is understandably not excited at the potential for worsening confusion or more falls.  I told him I would feel differently about starting a medication if she was not receiving the excellent care that she is, but since she is not sitting in her own urine or developing sores or other concerns, it would be best just to maintain things as they are.  He agreed.

## 2022-02-22 NOTE — Patient Instructions (Signed)
Follow up as needed or as scheduled Continue to take excellent care of her- you're both doing great!!! Call with any questions or concerns Stay safe!  Stay healthy! Happy Fall!!!

## 2022-02-25 ENCOUNTER — Other Ambulatory Visit: Payer: Self-pay

## 2022-02-25 DIAGNOSIS — N390 Urinary tract infection, site not specified: Secondary | ICD-10-CM

## 2022-02-25 LAB — URINE CULTURE
MICRO NUMBER:: 14025696
SPECIMEN QUALITY:: ADEQUATE

## 2022-02-25 MED ORDER — CEPHALEXIN 500 MG PO CAPS: 500.0000 mg | ORAL_CAPSULE | Freq: Two times a day (BID) | ORAL | 0 refills | Status: AC

## 2022-02-25 NOTE — Progress Notes (Signed)
Spoke with pt husband Micheal (he is on HIPP[employer]) informed him of lab results and advised the medication Cephalaexin has been sent to the pharmacy

## 2022-03-08 ENCOUNTER — Other Ambulatory Visit: Payer: Self-pay | Admitting: Family Medicine

## 2022-03-08 ENCOUNTER — Other Ambulatory Visit: Payer: Self-pay | Admitting: Lab

## 2022-03-08 MED ORDER — SUMATRIPTAN SUCCINATE 100 MG PO TABS: ORAL_TABLET | ORAL | 0 refills | Status: DC

## 2022-03-24 ENCOUNTER — Encounter (HOSPITAL_COMMUNITY): Payer: Self-pay

## 2022-03-24 ENCOUNTER — Emergency Department (HOSPITAL_COMMUNITY)
Admission: EM | Admit: 2022-03-24 | Discharge: 2022-03-24 | Disposition: A | Discharge status: Home / Self Care | Payer: Medicare Other | Attending: Emergency Medicine | Admitting: Emergency Medicine

## 2022-03-24 ENCOUNTER — Emergency Department (HOSPITAL_COMMUNITY): Payer: Medicare Other

## 2022-03-24 DIAGNOSIS — Z7982 Long term (current) use of aspirin: Secondary | ICD-10-CM | POA: Insufficient documentation

## 2022-03-24 DIAGNOSIS — E039 Hypothyroidism, unspecified: Secondary | ICD-10-CM | POA: Diagnosis not present

## 2022-03-24 DIAGNOSIS — J449 Chronic obstructive pulmonary disease, unspecified: Secondary | ICD-10-CM | POA: Insufficient documentation

## 2022-03-24 DIAGNOSIS — F0390 Unspecified dementia without behavioral disturbance: Secondary | ICD-10-CM | POA: Insufficient documentation

## 2022-03-24 DIAGNOSIS — M25551 Pain in right hip: Secondary | ICD-10-CM | POA: Insufficient documentation

## 2022-03-24 NOTE — ED Provider Triage Note (Signed)
Emergency Medicine Provider Triage Evaluation Note  [name] , a 81 y.o. female  was evaluated in triage.  Pt complains of fall on Thursday, unknown reasons, husband heard her hit the ground, c/o hip pain. Friday pain in right hip, Saturday had Xrs at ortho that looked ok. Ongoing terrible pain in the right hip. Taking Norco for scoliosis. Monday went back to [facility], started percocet and prednisone and ordered MRI (scheduled next week).  Dementia, hx per husband.  Review of Systems  Positive:  Negative:   Physical Exam  BP (!) 148/102 (BP Location: Left Arm)   Pulse 81   Resp 18   SpO2 98%  Gen:   Awake, no distress   Resp:  Normal effort  MSK:   Moves extremities without difficulty  Other:  TTP midline lower back, no pain with log roll hip, points to right groin area for location of pain.   Medical Decision Making  Medically screening exam initiated at 2:37 PM.  Appropriate orders placed.  [name] was informed that the remainder of the evaluation will be completed by another provider, this initial triage assessment does not replace that evaluation, and the importance of remaining in the ED until their evaluation is complete.     Jeannie Fend, PA-C 03/24/22 1441

## 2022-03-24 NOTE — ED Notes (Signed)
Pt care taken,  complaining of pain in the rt hip. Did fall 6 days ago.

## 2022-03-24 NOTE — Discharge Instructions (Addendum)
You were evaluated in the Emergency Department and after careful evaluation, we did not find any emergent condition requiring admission or further testing in the hospital.  Your CT scan of the hip was negative for acute fracture or dislocation. The CT scan of your back shows a indeterminate compression fracture of T12-L1.  As discussed, take Percocet 10-325 every 4 hours as needed for pain. Follow up with your MRI appointment.   Please return to the Emergency Department if you experience any worsening of your condition.  We encourage you to follow up with a primary care provider.  Thank you for allowing [place] to be a part of your care.

## 2022-03-24 NOTE — ED Provider Notes (Signed)
[facility]-EMERGENCY DEPT Provider Note   CSN: [id number] Arrival date & time: 03/24/22  1417     History  Chief Complaint  Patient presents with   Hip Pain    [name] is a 81 y.o. female.  HPI 81 year old female with a history of GERD, dementia, urinary incontinence, COPD, hypothyroidism, OA presents to the ER with right hip pain.  Patient has a history of dementia, history provided by husband at bedside.  Patient had a fall 6 days ago.  Has been minimally ambulatory and complaining of worsening pain.  She went to [facility] where they did an x-ray and did not find any signs of fracture.  They have scheduled a MRI for Tuesday.  Patient has been taking Percocet 02/17/2024 every 6 hours for pain.  Patient was on Norco for scoliosis prior to the fall.  Patient has been complaining of worsening pain, states "I want to die from the pain".  She initially was taking the Percocet every 6 hours but her husband states that he has been having to give it now every 5 hours as the medication wears off about every 2 hours.  He is concerned about some "soft tissue damage" as was relayed to him by [facility]    Home Medications Prior to Admission medications   Medication Sig Start Date End Date Taking? Authorizing Provider  Aspirin-Caffeine (ANACIN PO) Take by mouth.    [provider]  chlorhexidine (PERIDEX) 0.12 % solution  04/04/19   [provider]  Cholecalciferol (VITAMIN D3) 1000 units CAPS Vitamin D3 1,000 unit (25 mcg) tablet   1 tablet every day by oral route.    [provider]  HYDROcodone-acetaminophen (NORCO) 10-325 MG tablet Take 1 tablet by mouth daily as needed. Rx'ed by Dr. Ethelene Hal 02/26/17   [provider]  memantine (NAMENDA) 10 MG tablet Take 1 tablet (10 mg total) by mouth 2 (two) times daily. 08/14/21   Nita Sickle K, DO  Multiple Vitamins-Minerals (OCUVITE ADULT FORMULA) CAPS Take 1 capsule by mouth daily.     [provider]  pantoprazole (PROTONIX) 40 MG tablet Take 1 tablet (40 mg total) by mouth daily. 01/15/22   Sheliah Hatch, MD  Potassium 99 MG TABS potassium 99 mg tablet   1 tablet every day by oral route.    [provider]  sertraline (ZOLOFT) 50 MG tablet Take 1 tablet (50 mg total) by mouth daily. 01/14/22   Patel, Roxana Hires K, DO  SUMAtriptan (IMITREX) 100 MG tablet TAKE 1 TABLET BY MOUTH FOR MIGRAINES. 03/08/22   Sheliah Hatch, MD  SUMAtriptan (IMITREX) 100 MG tablet TAKE 1 TABLET BY MOUTH FOR MIGRAINE. May repeat in 2 hours if headache persists or recurs. 03/09/22   Sheliah Hatch, MD  SYNTHROID 75 MCG tablet Take 1 tablet (75 mcg total) by mouth daily before breakfast. 02/16/22   Sheliah Hatch, MD  vitamin E 400 UNIT capsule daily.    [provider]      Allergies    Terbinafine hcl, Prolia [denosumab], Dorzolamide hcl-timolol mal, Thimerosal (thiomersal), Timolol, and Lamisil  [terbinafine]    Review of Systems   Review of Systems Ten systems reviewed and are negative for acute change, except as noted in the HPI.   Physical Exam Updated Vital Signs BP (!) 174/94   Pulse 60   Temp 98 F (36.7 C) (Oral)   Resp 18   SpO2 94%  Physical Exam Vitals and nursing note reviewed.  Constitutional:      General: She is not in acute distress.    Appearance: She is well-developed.  HENT:     Head: Normocephalic and atraumatic.  Eyes:     Conjunctiva/sclera: Conjunctivae normal.  Cardiovascular:     Rate and Rhythm: Normal rate and regular rhythm.     Heart sounds: No murmur heard. Pulmonary:     Effort: Pulmonary effort is normal. No respiratory distress.     Breath sounds: Normal breath sounds.  Abdominal:     Palpations: Abdomen is soft.     Tenderness: There is no abdominal tenderness.  Musculoskeletal:        General: Tenderness present. No swelling.     Cervical back: Neck supple.       Legs:     Comments: Point tenderness  to right hip, strength 4/5 bilaterally, no LE edema, no shortening, internal/external rotation. Negative SLE. Midline tenderness to T and Lspine. No step-off or crepitus  Skin:    General: Skin is warm and dry.     Capillary Refill: Capillary refill takes less than 2 seconds.  Neurological:     General: No focal deficit present.     Mental Status: She is alert and oriented to person, place, and time.  Psychiatric:        Mood and Affect: Mood normal.     ED Results / Procedures / Treatments   Labs (all labs ordered are listed, but only abnormal results are displayed) Labs Reviewed - No data to display  EKG None  Radiology CT PELVIS WO CONTRAST  Result Date: 03/24/2022 CLINICAL DATA:  Hip pain after fall last week. EXAM: CT PELVIS WITHOUT CONTRAST TECHNIQUE: Multidetector CT imaging of the pelvis was performed following the standard protocol without intravenous contrast. RADIATION DOSE REDUCTION: This exam was performed according to the departmental dose-optimization program which includes automated exposure control, adjustment of the mA and/or kV according to patient size and/or use of iterative reconstruction technique. COMPARISON:  None Available. FINDINGS: Urinary Tract:  No abnormality visualized. Bowel:  Unremarkable visualized pelvic bowel loops. Vascular/Lymphatic: Atherosclerosis of abdominal aorta is noted. No significant adenopathy is noted. Reproductive: Status post hysterectomy. No adnexal abnormality is noted. Other:  No ascites or hernia is noted. Musculoskeletal: There is no definite evidence of acute fracture or dislocation. Hip and sacroiliac joints are unremarkable. IMPRESSION: No acute abnormality seen in the pelvis. Aortic Atherosclerosis (ICD10-I70.0). Electronically Signed   By: Lupita Raider M.D.   On: 03/24/2022 15:24   CT Lumbar Spine Wo Contrast  Result Date: 03/24/2022 CLINICAL DATA:  Low back pain, trauma back pain, XR negative at ortho EXAM: CT LUMBAR SPINE  WITHOUT CONTRAST TECHNIQUE: Multidetector CT imaging of the lumbar spine was performed without intravenous contrast administration. Multiplanar CT image reconstructions were also generated. RADIATION DOSE REDUCTION: This exam was performed according to the departmental dose-optimization program which includes automated exposure control, adjustment of the mA and/or kV according to patient size and/or use of iterative reconstruction technique. COMPARISON:  None Available. FINDINGS: Segmentation: 5 non rib-bearing lumbar vertebral bodies. Alignment: Grade 1 anterolisthesis of L4 on L5. Vertebrae: Mild age indeterminate compression fractures of T12 and L1. Osteopenia. Paraspinal and other soft tissues: Aortic atherosclerosis. Cholecystectomy clips. Disc levels: Severe lower lumbar facet arthropathy. Potential for impingement on the left at L4-L5. An MRI could further characterize if clinically warranted. IMPRESSION: 1. Mild age indeterminate compression fractures of T12 and L1. Recommend correlation with the presence or absence of point tenderness. MRI  could further assess chronicity if clinically warranted. 2. Grade 1 anterolisthesis of L4 on L5 with severe lower lumbar facet arthropathy. Potential for impingement on the left at L4-L5. An MRI could further characterize if clinically warranted. 3. Lumbar levocurvature. 4. Osteopenia. Electronically Signed   By: Feliberto Harts M.D.   On: 03/24/2022 15:23    Procedures Procedures    Medications Ordered in ED Medications - No data to display  ED Course/ Medical Decision Making/ A&P                           Medical Decision Making  81 year old female presents to the ER with complaints of right hip pain.  Had a fall 6 days ago.  Has been ambulatory at home but complaining of severe pain.  She has point tenderness to her right hip on exam, no leg shortening, no internal/external rotation.  Mild midline tenderness to the thoracic and lumbar spine.   Neurovascularly intact.  History of dementia.  Reviewed, agree with radiology read, CT of the hip negative for acute pathology, CT of the lumbar spine shows indeterminate compression fractures of the T12-L1 for patient is not complaining of low back pain and is complaining of right hip pain.  Family made aware.  Shared decision-making conversation with the patient's husband at bedside, he is concerned about her level of pain control.  I did offer giving IV pain medicines which husband declined given this would only be short acting.  We discussed increasing her Percocet to every 4 hours instead of every 6 hours which the husband is agreeable to.  Will avoid muscle relaxers given she is already taking high doses of opioid medications and I explained that this would likely worsen her dementia.  I offered admission for pain control and possible PT however we also discussed that admission would likely worsen her dementia.  Husband agrees and would like to avoid that if possible.  Despite her being in pain, he is comfortable with her ambulatory status at home.  Per shared decision making conversation, we will plan for increasing her Percocet frequency and following up with her MRI.  Discussed return precautions.  They voice understanding and are agreeable.  Stable for discharge  I discussed the case with Dr. Wallace Cullens who is agreeable to the above plan and disposition   Final Clinical Impression(s) / ED Diagnoses Final diagnoses:  Right hip pain    Rx / DC Orders ED Discharge Orders     None         Mare Ferrari, PA-C 03/24/22 2307    Franne Forts, DO 03/29/22 [phone]

## 2022-03-24 NOTE — ED Triage Notes (Addendum)
Pt presents with c/o right hip pain after a fall 6 days ago. Pt normally ambulatory at baseline, has been able to walk on the hip since a fall but has been in a great deal of pain. Pt taking percocet for pain. Pt has a hx of dementia. X-ray negative for fracture but PCP concerned about soft tissue damage possibly.

## 2022-04-07 ENCOUNTER — Other Ambulatory Visit: Payer: Self-pay | Admitting: Physical Medicine and Rehabilitation

## 2022-04-07 DIAGNOSIS — K838 Other specified diseases of biliary tract: Secondary | ICD-10-CM

## 2022-04-14 ENCOUNTER — Other Ambulatory Visit: Payer: Self-pay | Admitting: Family Medicine

## 2022-04-14 DIAGNOSIS — K219 Gastro-esophageal reflux disease without esophagitis: Secondary | ICD-10-CM

## 2022-04-16 ENCOUNTER — Telehealth: Payer: Self-pay | Admitting: Family Medicine

## 2022-04-16 NOTE — Telephone Encounter (Signed)
[name] called stating that she would like to discuss patient's state of mind with Dr Beverely Low. She had a fall about a week ago, and her scans are in the chart. Patient states she is nauseated much of the time, complains that her abdomen hurts, she is also making comments that she knows she is dying inside. He states that he feels like there is something really wrong with her and would like to talk to Dr Beverely Low without [name] present. I let Casimiro Needle know that I would ask Dr Beverely Low what she would like to do in this case and get back with him as soon as I can.

## 2022-04-16 NOTE — Telephone Encounter (Signed)
If she is having abdominal pain and nausea she needs to be evaluated.  The CT scan she had done was of her pelvis and not her abdomen.  If he feels like something is 'really wrong' with her, she needs to be seen ASAP.  They can do this at an Urgent Care or ER as it is 2:30pm on a Friday but I would not wait until next week.  He is not an alarmist and doesn't tend to overreact, so his concern is enough to cause me concern as well.

## 2022-04-16 NOTE — Telephone Encounter (Signed)
Spoke to husband and he stated that right now she is saying the abdominal pain isn't that bad, he opted for an appt next week. I let him know that if anything got worse over the weekend to please go to an urgent care or ER. He voiced understanding

## 2022-04-21 ENCOUNTER — Ambulatory Visit (INDEPENDENT_AMBULATORY_CARE_PROVIDER_SITE_OTHER): Payer: Medicare Other | Admitting: Family Medicine

## 2022-04-21 ENCOUNTER — Encounter: Payer: Self-pay | Admitting: Family Medicine

## 2022-04-21 VITALS — BP 108/60 | HR 71 | Temp 97.1°F | Resp 18 | Ht 60.0 in | Wt 121.0 lb

## 2022-04-21 DIAGNOSIS — R140 Abdominal distension (gaseous): Secondary | ICD-10-CM | POA: Diagnosis not present

## 2022-04-21 DIAGNOSIS — K5909 Other constipation: Secondary | ICD-10-CM

## 2022-04-21 DIAGNOSIS — E039 Hypothyroidism, unspecified: Secondary | ICD-10-CM

## 2022-04-21 DIAGNOSIS — R1084 Generalized abdominal pain: Secondary | ICD-10-CM | POA: Diagnosis not present

## 2022-04-21 LAB — POCT URINALYSIS DIPSTICK
Glucose, UA: NEGATIVE
Protein, UA: NEGATIVE
Spec Grav, UA: 1.02 (ref 1.010–1.025)
Urobilinogen, UA: 0.2 E.U./dL
pH, UA: 7 (ref 5.0–8.0)

## 2022-04-21 NOTE — Patient Instructions (Signed)
We'll determine follow up based on the lab results Add a teaspoon of ClearLax daily to try and keep things moving Try and get her to eat and drink regularly but don't force the issue If you change your mind about Hospice or if her situation dramatically changes, please let me know Call with any questions or concerns Hang in there!! Happy Holidays!

## 2022-04-21 NOTE — Progress Notes (Signed)
   Subjective:    Patient ID: [name], female    DOB: Sep 24, 1940, 81 y.o.   MRN: [id number]  HPI Abd pain- pt has a CT scan pending for end of month to evaluate common bile duct dilatation and intrahepatic dilatation.  She is repeatedly telling husband that she's 'dying inside'.  She's been saying this for a few months.  Husband feels abdomen is distended.  She has constipation from ongoing pain med use- husband is using ClearLax.  She tends to go ~3 days between BM's.  Last BM Sunday.  Husband reports 'she is tired all the time'.  Sleeping more than usual.  Brought urine sample today to be tested for infxn.   Review of Systems For ROS see HPI     Objective:   Physical Exam Vitals reviewed.  Constitutional:      General: She is not in acute distress.    Appearance: She is well-developed. She is not ill-appearing.  HENT:     Head: Normocephalic and atraumatic.  Eyes:     Extraocular Movements: Extraocular movements intact.     Pupils: Pupils are equal, round, and reactive to light.  Cardiovascular:     Rate and Rhythm: Normal rate and regular rhythm.  Pulmonary:     Effort: Pulmonary effort is normal. No respiratory distress.  Abdominal:     General: Bowel sounds are normal. There is distension.     Tenderness: There is no abdominal tenderness.  Skin:    General: Skin is warm and dry.  Neurological:     Mental Status: She is alert. She is disoriented.     Comments: At baseline for pt           Assessment & Plan:   Abd distension- new.  Not painful on exam today but pt has complained about this to husband.  He reports she does not have regular BM's and will typically go every 3 days or so.  Has been using ClearLax at home but this has caused big blow outs when used as directed.  Encouraged him to use a teaspoon daily just to keep things moving.  I suspect her distension and discomfort will improve w/ regular BM's.  Will also check labs to assess for underlying cause of  pain and distension.  Will do UA and culture.  Pt expressed understanding and is in agreement w/ plan.

## 2022-04-22 ENCOUNTER — Telehealth: Payer: Self-pay

## 2022-04-22 LAB — AMYLASE: Amylase: 20 U/L — ABNORMAL LOW (ref 27–131)

## 2022-04-22 LAB — HEPATIC FUNCTION PANEL
ALT: 8 U/L (ref 0–35)
AST: 16 U/L (ref 0–37)
Albumin: 4.3 g/dL (ref 3.5–5.2)
Alkaline Phosphatase: 81 U/L (ref 39–117)
Bilirubin, Direct: 0.1 mg/dL (ref 0.0–0.3)
Total Bilirubin: 0.5 mg/dL (ref 0.2–1.2)
Total Protein: 6.4 g/dL (ref 6.0–8.3)

## 2022-04-22 LAB — LIPASE: Lipase: 11 U/L (ref 11.0–59.0)

## 2022-04-22 LAB — TSH: TSH: 1.49 u[IU]/mL (ref 0.35–5.50)

## 2022-04-22 LAB — CBC WITH DIFFERENTIAL/PLATELET
Basophils Absolute: 0 10*3/uL (ref 0.0–0.1)
Basophils Relative: 0.7 % (ref 0.0–3.0)
Eosinophils Absolute: 0.2 10*3/uL (ref 0.0–0.7)
Eosinophils Relative: 3.3 % (ref 0.0–5.0)
HCT: 41 % (ref 36.0–46.0)
Hemoglobin: 14.1 g/dL (ref 12.0–15.0)
Lymphocytes Relative: 37.9 % (ref 12.0–46.0)
Lymphs Abs: 2 10*3/uL (ref 0.7–4.0)
MCHC: 34.4 g/dL (ref 30.0–36.0)
MCV: 93.4 fl (ref 78.0–100.0)
Monocytes Absolute: 0.5 10*3/uL (ref 0.1–1.0)
Monocytes Relative: 8.5 % (ref 3.0–12.0)
Neutro Abs: 2.6 10*3/uL (ref 1.4–7.7)
Neutrophils Relative %: 49.6 % (ref 43.0–77.0)
Platelets: 95 10*3/uL — ABNORMAL LOW (ref 150.0–400.0)
RBC: 4.39 Mil/uL (ref 3.87–5.11)
RDW: 13.7 % (ref 11.5–15.5)
WBC: 5.3 10*3/uL (ref 4.0–10.5)

## 2022-04-22 LAB — BASIC METABOLIC PANEL
BUN: 23 mg/dL (ref 6–23)
CO2: 27 mEq/L (ref 19–32)
Calcium: 9.3 mg/dL (ref 8.4–10.5)
Chloride: 105 mEq/L (ref 96–112)
Creatinine, Ser: 0.72 mg/dL (ref 0.40–1.20)
GFR: 77.96 mL/min (ref >60.00)
Glucose, Bld: 94 mg/dL (ref 70–99)
Potassium: 4 mEq/L (ref 3.5–5.1)
Sodium: 139 mEq/L (ref 135–145)

## 2022-04-22 NOTE — Telephone Encounter (Signed)
-----   Message from Sheliah Hatch, MD sent at 04/22/2022  4:23 PM EST ----- Labs look great w/ the exception of low platelets.  This is not anything to be concerned about right now as they need to be considerably lower for [place] to worry about bleeding.  Platelets fluctuate more than the other blood counts and we will just continue to monitor going forward.

## 2022-04-22 NOTE — Telephone Encounter (Signed)
Informed pt husband of lab results

## 2022-04-24 LAB — URINE CULTURE
MICRO NUMBER:: 14279745
SPECIMEN QUALITY:: ADEQUATE

## 2022-04-25 ENCOUNTER — Encounter: Payer: Self-pay | Admitting: Family Medicine

## 2022-04-25 MED ORDER — CEPHALEXIN 500 MG PO CAPS: 500.0000 mg | ORAL_CAPSULE | Freq: Two times a day (BID) | ORAL | 0 refills | Status: AC

## 2022-04-26 ENCOUNTER — Telehealth: Payer: Self-pay

## 2022-04-26 NOTE — Telephone Encounter (Signed)
-----   Message from Sheliah Hatch, MD sent at 04/25/2022  8:56 AM EST ----- Your urine shows there is currently infection.  Based on this, we will start Cephalexin 500mg  twice daily x5 days.  I am sending the prescription now so it will be available for pickup later today

## 2022-04-26 NOTE — Telephone Encounter (Signed)
Left pt a VM asking pt to call office in regards to lab request

## 2022-04-26 NOTE — Telephone Encounter (Signed)
Spoke to the pt husband and informed him of wife's urine results and advised he can pick her Antibiotic today

## 2022-05-13 ENCOUNTER — Other Ambulatory Visit: Payer: Self-pay | Admitting: Physical Medicine and Rehabilitation

## 2022-05-13 ENCOUNTER — Ambulatory Visit
Admission: RE | Admit: 2022-05-13 | Discharge: 2022-05-13 | Disposition: A | Discharge status: Home / Self Care | Payer: Medicare Other | Source: Ambulatory Visit | Attending: Physical Medicine and Rehabilitation | Admitting: Physical Medicine and Rehabilitation

## 2022-05-13 DIAGNOSIS — K838 Other specified diseases of biliary tract: Secondary | ICD-10-CM

## 2022-05-14 ENCOUNTER — Other Ambulatory Visit: Payer: Self-pay | Admitting: Family Medicine

## 2022-06-02 ENCOUNTER — Encounter: Payer: Self-pay | Admitting: Family Medicine

## 2022-06-02 ENCOUNTER — Ambulatory Visit (INDEPENDENT_AMBULATORY_CARE_PROVIDER_SITE_OTHER): Payer: Medicare Other | Admitting: Family Medicine

## 2022-06-02 VITALS — BP 116/74 | HR 78 | Temp 97.5°F | Wt 121.8 lb

## 2022-06-02 DIAGNOSIS — R413 Other amnesia: Secondary | ICD-10-CM

## 2022-06-02 DIAGNOSIS — H6121 Impacted cerumen, right ear: Secondary | ICD-10-CM | POA: Diagnosis not present

## 2022-06-02 DIAGNOSIS — T162XXA Foreign body in left ear, initial encounter: Secondary | ICD-10-CM | POA: Diagnosis not present

## 2022-06-02 DIAGNOSIS — R350 Frequency of micturition: Secondary | ICD-10-CM

## 2022-06-02 DIAGNOSIS — H6123 Impacted cerumen, bilateral: Secondary | ICD-10-CM

## 2022-06-02 MED ORDER — CEPHALEXIN 500 MG PO CAPS: 500.0000 mg | ORAL_CAPSULE | Freq: Two times a day (BID) | ORAL | 0 refills | Status: AC

## 2022-06-02 NOTE — Patient Instructions (Signed)
Follow up as needed No need for lab work today b/c the labs in December looked great! START the Cephalexin twice daily for possible UTI USE the OTC ear drops to help dissolve the wax Call with any questions or concerns Hang in there!!!

## 2022-06-02 NOTE — Progress Notes (Signed)
   Subjective:    Patient ID: [name], female    DOB: [birth year], 82 y.o.   MRN: [id number]  HPI Urinary frequency- the other day she went to the bathroom 'at least 15 times'  was unable to use the bathroom today to test for UTI.  No burning w/ urination, no fever.    Dementia- husband reports things have been 'downhill' the last 4-5 days.  She has been constantly confused.  Limited response when husband speaks to her.  He would like her ears checked to r/o wax impaction.     Review of Systems For ROS see HPI     Objective:   Physical Exam Vitals reviewed.  Constitutional:      General: She is not in acute distress.    Appearance: Normal appearance. She is not ill-appearing.  HENT:     Head: Normocephalic and atraumatic.     Right Ear: There is impacted cerumen (wax was successfully removed using lighted curette.  pt tolerated w/o difficulty).     Left Ear: There is impacted cerumen (cerumen removal was very difficult but after multiple attempts, a wax encrusted button battery was removed.  hearing immediately improved).  Cardiovascular:     Rate and Rhythm: Normal rate and regular rhythm.     Pulses: Normal pulses.     Heart sounds: Normal heart sounds.  Pulmonary:     Effort: Pulmonary effort is normal. No respiratory distress.     Breath sounds: Normal breath sounds. No wheezing or rhonchi.  Abdominal:     General: There is no distension.     Palpations: Abdomen is soft.     Tenderness: There is no abdominal tenderness.  Musculoskeletal:     Cervical back: Normal range of motion.  Lymphadenopathy:     Cervical: No cervical adenopathy.  Skin:    General: Skin is warm and dry.  Neurological:     General: No focal deficit present.     Mental Status: She is alert. She is disoriented.  Psychiatric:        Mood and Affect: Mood normal.        Behavior: Behavior normal.           Assessment & Plan:  Cerumen impaction/foreign body- new.  Pt had copious wax that  was able to be removed from R ear w/o difficulty.  On L side, it was far more difficult to remove and wax was quite hard and making a clicking noise when the curette would strike it.  Eventually a corroded button battery encrusted in wax was removed and pt's hearing immediately improved.  Husband was unaware there was a foreign body present and thinks she mistakenly put this in her ear thinking it was her hearing aid.

## 2022-06-14 NOTE — Assessment & Plan Note (Signed)
Husband reports dementia is worsening.  Last 4-5 days pt has been constantly confused.  When this has happened in the past, pt had UTI.  Pt was not able to provide sample today but given her urinary frequency, will tx w/ Keflex and monitor for improvement in both urinary sxs and confusion.  Husband expressed understanding and agreement.

## 2022-06-17 ENCOUNTER — Encounter: Payer: Self-pay | Admitting: Neurology

## 2022-06-20 ENCOUNTER — Encounter: Payer: Self-pay | Admitting: Family Medicine

## 2022-07-15 ENCOUNTER — Other Ambulatory Visit: Payer: Self-pay | Admitting: Family Medicine

## 2022-07-15 DIAGNOSIS — K219 Gastro-esophageal reflux disease without esophagitis: Secondary | ICD-10-CM

## 2022-08-16 ENCOUNTER — Encounter: Payer: Self-pay | Admitting: Neurology

## 2022-08-16 ENCOUNTER — Other Ambulatory Visit: Payer: Self-pay | Admitting: Family Medicine

## 2022-08-16 ENCOUNTER — Ambulatory Visit (INDEPENDENT_AMBULATORY_CARE_PROVIDER_SITE_OTHER): Payer: Medicare Other | Admitting: Neurology

## 2022-08-16 VITALS — BP 110/62 | HR 75 | Ht 60.0 in | Wt 126.0 lb

## 2022-08-16 DIAGNOSIS — G309 Alzheimer's disease, unspecified: Secondary | ICD-10-CM | POA: Diagnosis not present

## 2022-08-16 DIAGNOSIS — F0280 Dementia in other diseases classified elsewhere without behavioral disturbance: Secondary | ICD-10-CM

## 2022-08-16 MED ORDER — MEMANTINE HCL 10 MG PO TABS: 10.0000 mg | ORAL_TABLET | Freq: Two times a day (BID) | ORAL | 3 refills | Status: DC

## 2022-08-16 MED ORDER — SERTRALINE HCL 50 MG PO TABS: 50.0000 mg | ORAL_TABLET | Freq: Every day | ORAL | 3 refills | Status: DC

## 2022-08-16 NOTE — Patient Instructions (Signed)
It was lovely to see you today.  You may request future refills through your primary care doctor or you are welcome to see me for follow-up visit.

## 2022-08-16 NOTE — Progress Notes (Signed)
Follow-up Visit   Date: 08/16/22   BAHAREH DENISCO MRN: [id number] DOB: 12/19/1940   Interim History: [name] is a 82 y.o. Caucasian female returning to the clinic for follow-up of dementia.  The patient was accompanied to the clinic by husband who also provides collateral information.    Over the past year, there has been gradual decline in cognitive function.  Husband assists with personal hygiene including bathing, dressing, and toileting.  She gets frustrated that she cannot get her words out or remember names.  Mood has been good.  She tends to sleep a lot during the day.   Husband tries to engage her with things like laundry, but recognizes her frustration when she is unable to do things like she used to.  They have a caregiver that comes ~3 days per week. She had a few falls over the past year, one which results in fractured ribs. Most of the falls have occurred when she is not supervised (i.e., spouse in another room). He endorses that it is getting more difficult to bring her to her appointment here today.    Medications:  Current Outpatient Medications on File Prior to Visit  Medication Sig Dispense Refill   Aspirin-Caffeine (ANACIN PO) Take by mouth.     chlorhexidine (PERIDEX) 0.12 % solution      Cholecalciferol (VITAMIN D3) 1000 units CAPS Vitamin D3 1,000 unit (25 mcg) tablet   1 tablet every day by oral route.     HYDROcodone-acetaminophen (NORCO) 10-325 MG tablet Take 1 tablet by mouth daily as needed. Rx'ed by Dr. Nelva Bush     memantine (NAMENDA) 10 MG tablet Take 1 tablet (10 mg total) by mouth 2 (two) times daily. 180 tablet 3   Multiple Vitamins-Minerals (OCUVITE ADULT FORMULA) CAPS Take 1 capsule by mouth daily.     pantoprazole (PROTONIX) 40 MG tablet Take 1 tablet (40 mg total) by mouth daily. 90 tablet 0   sertraline (ZOLOFT) 50 MG tablet Take 1 tablet (50 mg total) by mouth daily. 90 tablet 3   SUMAtriptan (IMITREX) 100 MG tablet TAKE 1 TABLET BY MOUTH  FOR MIGRAINES. 9 tablet 0   SUMAtriptan (IMITREX) 100 MG tablet TAKE 1 TABLET BY MOUTH FOR MIGRAINE. May repeat in 2 hours if headache persists or recurs. 9 tablet 0   No current facility-administered medications on file prior to visit.    Allergies:  Allergies  Allergen Reactions   Terbinafine Hcl Hives   Prolia [Denosumab] Rash   Dorzolamide Hcl-Timolol Mal Other (See Comments)   Thimerosal (Thiomersal) Swelling    Redness   Timolol    Lamisil  [Terbinafine] Rash    Vital Signs:  Pulse 75   Ht 5' (1.524 m)   Wt 126 lb (57.2 kg)   SpO2 95%   BMI 24.61 kg/m   Neurological Exam: MENTAL STATUS including orientation to person and place.  Year is unknown.  She can answer simple questions and correctly identifies herself and month which she was born.  Date and year unknown.  She is able to identify her husband, but cannot recall his name.  Speech is not dysarthric.  CRANIAL NERVES:  Normal conjugate, extra-ocular eye movements in all direction of gaze.  No ptosis.  Face is symmetric.   MOTOR:  Motor strength is 5/5 in all extremities  COORDINATION/GAIT:  Gait somewhat unsteady, unassisted.   Data: n/a  IMPRESSION/PLAN: Alzheimer's dementia, with progression as expected with disease course.  She is able to feed  herself, but otherwise needs assistance with ADLs and all IADLs.  She has excellent support from her husband.  Previously, she did not tolerate Aricept.  - Continue Namenda 10mg  BID  - Continue Zoloft 50mg  daily for mood  - Fall precautions discussed   In the future, they may follow-up with Dr. Birdie Riddle for refills as it is closer to their home. I am happy to see them back, as needed.   Thank you for allowing me to participate in patient's care.  If I can answer any additional questions, I would be pleased to do so.    Sincerely,    Deaisha Welborn K. Posey Pronto, DO

## 2022-08-19 ENCOUNTER — Other Ambulatory Visit: Payer: Self-pay | Admitting: Family Medicine

## 2022-09-08 DIAGNOSIS — S52509A Unspecified fracture of the lower end of unspecified radius, initial encounter for closed fracture: Secondary | ICD-10-CM | POA: Insufficient documentation

## 2022-09-15 ENCOUNTER — Other Ambulatory Visit: Payer: Self-pay | Admitting: Family Medicine

## 2022-09-15 NOTE — Telephone Encounter (Signed)
Patient is requesting a refill of the following medications: Requested Prescriptions   Pending Prescriptions Disp Refills   SUMAtriptan (IMITREX) 100 MG tablet [Pharmacy Med Name: SUMATRIPTAN SUCC 100 MG TABLET] 9 tablet 0    Sig: TAKE 1 TABLET BY MOUTH FOR MIGRAINE. May repeat in 2 hours if headache persists or recurs.    Date of patient request: 09/15/22 Last office visit: 06/02/2022 Date of last refill: 03/09/2022 Last refill amount: 9 tablets

## 2022-09-15 NOTE — Telephone Encounter (Signed)
If one of you gets the chance to fill this on Dr Wille Glaser behalf, thank you!

## 2022-09-17 ENCOUNTER — Other Ambulatory Visit: Payer: Self-pay

## 2022-09-17 MED ORDER — SUMATRIPTAN SUCCINATE 100 MG PO TABS: ORAL_TABLET | ORAL | 0 refills | Status: DC

## 2022-10-09 ENCOUNTER — Telehealth: Payer: Medicare Other | Admitting: Family Medicine

## 2022-10-09 DIAGNOSIS — N390 Urinary tract infection, site not specified: Secondary | ICD-10-CM

## 2022-10-09 MED ORDER — CEPHALEXIN 500 MG PO CAPS: 500.0000 mg | ORAL_CAPSULE | Freq: Two times a day (BID) | ORAL | 0 refills | Status: AC

## 2022-10-09 NOTE — Patient Instructions (Signed)
Urinary Tract Infection, Adult A urinary tract infection (UTI) is an infection of any part of the urinary tract. The urinary tract includes: The kidneys. The ureters. The bladder. The urethra. These organs make, store, and get rid of pee (urine) in the body. What are the causes? This infection is caused by germs (bacteria) in your genital area. These germs grow and cause swelling (inflammation) of your urinary tract. What increases the risk? The following factors may make you more likely to develop this condition: Using a small, thin tube (catheter) to drain pee. Not being able to control when you pee or poop (incontinence). Being female. If you are female, these things can increase the risk: Using these methods to prevent pregnancy: A medicine that kills sperm (spermicide). A device that blocks sperm (diaphragm). Having low levels of a female hormone (estrogen). Being pregnant. You are more likely to develop this condition if: You have genes that add to your risk. You are sexually active. You take antibiotic medicines. You have trouble peeing because of: A prostate that is bigger than normal, if you are female. A blockage in the part of your body that drains pee from the bladder. A kidney stone. A nerve condition that affects your bladder. Not getting enough to drink. Not peeing often enough. You have other conditions, such as: Diabetes. A weak disease-fighting system (immune system). Sickle cell disease. Gout. Injury of the spine. What are the signs or symptoms? Symptoms of this condition include: Needing to pee right away. Peeing small amounts often. Pain or burning when peeing. Blood in the pee. Pee that smells bad or not like normal. Trouble peeing. Pee that is cloudy. Fluid coming from the vagina, if you are female. Pain in the belly or lower back. Other symptoms include: Vomiting. Not feeling hungry. Feeling mixed up (confused). This may be the first symptom in  older adults. Being tired and grouchy (irritable). A fever. Watery poop (diarrhea). How is this treated? Taking antibiotic medicine. Taking other medicines. Drinking enough water. In some cases, you may need to see a specialist. Follow these instructions at home:  Medicines Take over-the-counter and prescription medicines only as told by your doctor. If you were prescribed an antibiotic medicine, take it as told by your doctor. Do not stop taking it even if you start to feel better. General instructions Make sure you: Pee until your bladder is empty. Do not hold pee for a long time. Empty your bladder after sex. Wipe from front to back after peeing or pooping if you are a female. Use each tissue one time when you wipe. Drink enough fluid to keep your pee pale yellow. Keep all follow-up visits. Contact a doctor if: You do not get better after 1-2 days. Your symptoms go away and then come back. Get help right away if: You have very bad back pain. You have very bad pain in your lower belly. You have a fever. You have chills. You feeling like you will vomit or you vomit. Summary A urinary tract infection (UTI) is an infection of any part of the urinary tract. This condition is caused by germs in your genital area. There are many risk factors for a UTI. Treatment includes antibiotic medicines. Drink enough fluid to keep your pee pale yellow. This information is not intended to replace advice given to you by your health care provider. Make sure you discuss any questions you have with your health care provider. Document Revised: 12/09/2019 Document Reviewed: 12/14/2019 Elsevier Patient Education  2024 Else[employer] Inc[employer]

## 2022-10-09 NOTE — Progress Notes (Signed)
Virtual Visit Consent   [name], you are scheduled for a virtual visit with a [facility] provider today. Just as with appointments in the office, your consent must be obtained to participate. Your consent will be active for this visit and any virtual visit you may have with one of our providers in the next 365 days. If you have a MyChart account, a copy of this consent can be sent to you electronically.  As this is a virtual visit, video technology does not allow for your provider to perform a traditional examination. This may limit your provider's ability to fully assess your condition. If your provider identifies any concerns that need to be evaluated in person or the need to arrange testing (such as labs, EKG, etc.), we will make arrangements to do so. Although advances in technology are sophisticated, we cannot ensure that it will always work on either your end or our end. If the connection with a video visit is poor, the visit may have to be switched to a telephone visit. With either a video or telephone visit, we are not always able to ensure that we have a secure connection.  By engaging in this virtual visit, you consent to the provision of healthcare and authorize for your insurance to be billed (if applicable) for the services provided during this visit. Depending on your insurance coverage, you may receive a charge related to this service.  I need to obtain your verbal consent now. Are you willing to proceed with your visit today? [name] has provided verbal consent on 10/09/2022 for a virtual visit (video or telephone). Georgana Curio, FNP  Date: 10/09/2022 10:59 AM  Virtual Visit via Video Note   I, Georgana Curio, connected with  [name]  ([id number], 06-11-1940) on 10/09/22 at 11:00 AM EDT by a video-enabled telemedicine application and verified that I am speaking with the correct person using two identifiers.  Location: Patient: Home Provider: Home office   I  discussed the limitations of evaluation and management by telemedicine and the availability of in person appointments. The patient expressed understanding and agreed to proceed.    History of Present Illness: [name] is a 82 y.o. who identifies as a female who was assigned female at birth, and is being seen today for complaints of burning on urination. She is on telehealth with her husband as she has dementia and he is her primary caregiver. She had a UTI last fall and in January. She complained of burning last night. She is confused as baseline and it is difficult to know if she is more confused. She does not have a fever. 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/17/2008   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 06/17/2008   Headache 04/17/2008   URINARY INCONTINENCE 04/17/2008   Osteoarthritis, multiple sites 02/16/2007   Hypothyroidism 11/30/2006   GERD (  gastroesophageal reflux disease) 11/30/2006   Fibromyalgia 11/30/2006    Allergies:  Allergies  Allergen Reactions   Terbinafine Hcl Hives   Prolia [Denosumab] Rash   Dorzolamide Hcl-Timolol Mal Other (See Comments)   Thimerosal (Thiomersal) Swelling    Redness   Timolol    Lamisil  [Terbinafine] Rash   Medications:  Current Outpatient Medications:     cephALEXin (KEFLEX) 500 MG capsule, Take 1 capsule (500 mg total) by mouth 2 (two) times daily for 5 days., Disp: 10 capsule, Rfl: 0   Aspirin-Caffeine (ANACIN PO), Take by mouth., Disp: , Rfl:    chlorhexidine (PERIDEX) 0.12 % solution, , Disp: , Rfl:    Cholecalciferol (VITAMIN D3) 1000 units CAPS, Vitamin D3 1,000 unit (25 mcg) tablet   1 tablet every day by oral route., Disp: , Rfl:    HYDROcodone-acetaminophen (NORCO) 10-325 MG tablet, Take 1 tablet by mouth daily as needed. Rx'ed by Dr. Ethelene Hal, Disp: , Rfl:    memantine (NAMENDA) 10 MG tablet, Take 1 tablet (10 mg total) by mouth 2 (two) times daily., Disp: 180 tablet, Rfl: 3   Multiple Vitamins-Minerals (OCUVITE ADULT FORMULA) CAPS, Take 1 capsule by mouth daily., Disp: , Rfl:    pantoprazole (PROTONIX) 40 MG tablet, Take 1 tablet (40 mg total) by mouth daily., Disp: 90 tablet, Rfl: 0   sertraline (ZOLOFT) 50 MG tablet, Take 1 tablet (50 mg total) by mouth daily., Disp: 90 tablet, Rfl: 3   SUMAtriptan (IMITREX) 100 MG tablet, TAKE 1 TABLET BY MOUTH FOR MIGRAINE. May repeat in 2 hours if headache persists or recurs., Disp: 9 tablet, Rfl: 0   SUMAtriptan (IMITREX) 100 MG tablet, TAKE 1 TABLET BY MOUTH FOR MIGRAINE. May repeat in 2 hours if headache persists or recurs., Disp: 9 tablet, Rfl: 0   SYNTHROID 75 MCG tablet, Take 1 tablet (75 mcg total) by mouth daily before breakfast., Disp: 90 tablet, Rfl: 0  Observations/Objective: Patient is well-developed, well-nourished in no acute distress.  Resting comfortably  at home.  Head is normocephalic, atraumatic.  No labored breathing.  Speech is clear and coherent with logical content.  Patient is alert and oriented at baseline.    Assessment and Plan: 1. Urinary tract infection without hematuria, site unspecified  Increase fluids, follow up with pcp this week, UC if sx worsen.   Follow Up Instructions: I discussed the assessment and treatment plan with the patient. The patient was  provided an opportunity to ask questions and all were answered. The patient agreed with the plan and demonstrated an understanding of the instructions.  A copy of instructions were sent to the patient via MyChart unless otherwise noted below.     The patient was advised to call back or seek an in-person evaluation if the symptoms worsen or if the condition fails to improve as anticipated.  Time:  I spent 10 minutes with the patient via telehealth technology discussing the above problems/concerns.    Georgana Curio, FNP

## 2022-10-12 ENCOUNTER — Encounter: Payer: Self-pay | Admitting: Podiatry

## 2022-10-12 ENCOUNTER — Ambulatory Visit (INDEPENDENT_AMBULATORY_CARE_PROVIDER_SITE_OTHER): Payer: Medicare Other | Admitting: Podiatry

## 2022-10-12 DIAGNOSIS — B351 Tinea unguium: Secondary | ICD-10-CM

## 2022-10-12 DIAGNOSIS — M79675 Pain in left toe(s): Secondary | ICD-10-CM | POA: Diagnosis not present

## 2022-10-12 DIAGNOSIS — M79674 Pain in right toe(s): Secondary | ICD-10-CM

## 2022-10-12 NOTE — Progress Notes (Signed)
  Subjective:  Patient ID: [name], female    DOB: January 27, 1941,  MRN: [id number]  Chief Complaint  Patient presents with   Nail Problem    np routine nail care/ nails curving / pt has dementia and husband usually trims/ he wanted to make sure feet are doing ok.    82 y.o. female presents with the above complaint. History confirmed with patient.  Nails are difficult to cut they are becoming quite thick and curved and, also notes discoloration, starting to cause her tenderness because of the thickness and length, he is unable to cut them effectively  Objective:  Physical Exam: warm, good capillary refill, no trophic changes or ulcerative lesions, normal DP and PT pulses, and normal sensory exam. Left Foot: dystrophic yellowed discolored nail plates with subungual debris Right Foot: dystrophic yellowed discolored nail plates with subungual debris   Assessment:   1. Pain due to onychomycosis of toenails of both feet      Plan:  Patient was evaluated and treated and all questions answered.  Discussed the etiology and treatment options for the condition in detail with the patient.  We discussed that her age and health likely not a good candidate for antifungal therapy orally or topically and would recommend routine intermittent debridement. Recommended debridement of the nails today. Sharp and mechanical debridement performed of all painful and mycotic nails today. Nails debrided in length and thickness using a nail nipper to level of comfort. Discussed treatment options including appropriate shoe gear. Follow up as needed for painful nails.    Return in about 3 months (around 01/12/2023) for RFC.

## 2022-10-14 ENCOUNTER — Other Ambulatory Visit: Payer: Self-pay | Admitting: Family Medicine

## 2022-10-14 DIAGNOSIS — K219 Gastro-esophageal reflux disease without esophagitis: Secondary | ICD-10-CM

## 2022-11-05 ENCOUNTER — Encounter: Payer: Self-pay | Admitting: Family Medicine

## 2022-11-05 ENCOUNTER — Telehealth (INDEPENDENT_AMBULATORY_CARE_PROVIDER_SITE_OTHER): Payer: Medicare Other | Admitting: Family Medicine

## 2022-11-05 DIAGNOSIS — R4182 Altered mental status, unspecified: Secondary | ICD-10-CM

## 2022-11-05 DIAGNOSIS — F03C0 Unspecified dementia, severe, without behavioral disturbance, psychotic disturbance, mood disturbance, and anxiety: Secondary | ICD-10-CM | POA: Diagnosis not present

## 2022-11-05 NOTE — Progress Notes (Signed)
Virtual Visit via Video   I connected with patient on 11/05/22 at 11:20 AM EDT by a video enabled telemedicine application and verified that I am speaking with the correct person using two identifiers.  Location patient: Home Location provider: [place] [facility], Office Persons participating in the virtual visit: Patient, Provider, CMA Sheryle Hail C)  I discussed the limitations of evaluation and management by telemedicine and the availability of in person appointments. The patient expressed understanding and agreed to proceed.  Subjective:   HPI:   Dementia- husband reports that things had been 'cruising along' until mid week.  She had not had a BM since Friday so they tried to use a suppository.  Then used an enema w/ some relief.  Yesterday pt was not able to walk.  She would stand but not move forward.  This morning was able to shuffle to bathroom.  She is not following commands.  Decreased appetite for last 2 days.  Was treated for UTI on 5/25 via video visit.  Husband feels changes have been very sudden.  Husband doesn't note any facial asymmetry or change in speech.  Husband reports pt's skin feels warm and she has been sweating at night- current temporal reading is 97.9  No cough or congestion.  ROS:   See pertinent positives and negatives per HPI.  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   PERIPHERAL  AUTONOMIC NEUROPATHY D/O CLASS ELSW 06/17/2008   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 06/17/2008   Headache 04/17/2008   URINARY INCONTINENCE 04/17/2008   Osteoarthritis, multiple sites 02/16/2007   Hypothyroidism 11/30/2006   GERD (gastroesophageal reflux disease) 11/30/2006   Fibromyalgia 11/30/2006    Social History   Tobacco Use   Smoking status: Never    Passive exposure: Never   Smokeless tobacco: Never  Substance Use Topics   Alcohol use: No    Current Outpatient Medications:    Aspirin-Caffeine (ANACIN PO), Take by mouth., Disp: , Rfl:    chlorhexidine (PERIDEX) 0.12 % solution, , Disp: , Rfl:    Cholecalciferol (VITAMIN D3) 1000 units CAPS, Vitamin D3 1,000 unit (25 mcg) tablet   1 tablet every day by oral route., Disp: , Rfl:    HYDROcodone-acetaminophen (NORCO) 10-325 MG tablet, Take 1 tablet by mouth daily as needed. Rx'ed by Dr. Ethelene Hal, Disp: , Rfl:    memantine (NAMENDA) 10 MG tablet, Take 1 tablet (10 mg total) by mouth 2 (two) times daily., Disp: 180 tablet, Rfl: 3   Multiple Vitamins-Minerals (OCUVITE ADULT FORMULA) CAPS, Take 1 capsule by mouth daily., Disp: , Rfl:    pantoprazole (PROTONIX) 40 MG tablet, Take 1 tablet (40 mg total) by mouth daily., Disp: 90 tablet, Rfl: 0   sertraline (ZOLOFT) 50 MG tablet, Take 1 tablet (50 mg total) by mouth daily., Disp: 90 tablet, Rfl: 3   SUMAtriptan (IMITREX) 100 MG tablet, TAKE 1 TABLET BY MOUTH FOR MIGRAINE. May repeat in 2 hours if headache persists or recurs., Disp: 9  tablet, Rfl: 0   SUMAtriptan (IMITREX) 100 MG tablet, TAKE 1 TABLET BY MOUTH FOR MIGRAINE. May repeat in 2 hours if headache persists or recurs., Disp: 9 tablet, Rfl: 0   SYNTHROID 75 MCG tablet, Take 1 tablet (75 mcg total) by mouth daily before breakfast., Disp: 90 tablet, Rfl: 0  Allergies  Allergen Reactions   Terbinafine Hcl Hives   Prolia [Denosumab] Rash   Dorzolamide Hcl-Timolol Mal Other (See Comments)   Thimerosal  (Thiomersal) Swelling    Redness   Timolol    Lamisil  [Terbinafine] Rash    Objective:   There were no vitals taken for this visit. Pt sleeping throughout visit  Assessment and Plan:   Dementia/Altered Mental Status- new.  Pt's husband reports that things were 'status quo' until 2 days ago.  At that time she was unable to walk, did not know how to use the bathroom, could not feed herself- which was all a change from the day before.  Discussed that her dementia is progressive but that it typically does not change suddenly or rapidly.  This leads to concern for underlying, reversible cause such as infection.  She was treated via video visit for UTI ~1 month ago.  Infection may not have been fully treated as no culture was done.  He reports her skin has felt warm to the touch and she has been sweating at night.  Husband is reluctant to get her out of the house and have her evaluated b/c she gets even more confused and upset.  I told him that I understand and I'm certainly not looking to make things hard on him- or her- but she really needed to be evaluated in case something could be done.  If this is her dementia progressing, I believe she is a Hospice candidate and will place the referral accordingly.  By end of visit, husband is willing to take pt to ER for evaluation.  He was going to take her to [facility] in [place].  Will follow via CareEverywhere   Neena Rhymes, MD 11/05/2022

## 2022-11-08 ENCOUNTER — Telehealth: Payer: Self-pay | Admitting: Family Medicine

## 2022-11-08 ENCOUNTER — Encounter: Payer: Self-pay | Admitting: Family Medicine

## 2022-11-08 NOTE — Telephone Encounter (Signed)
Patient getting discharged form hospital today.   Requiring HH orders for nursing and PT. Please advise   [facility] - [facility] [phone]

## 2022-11-10 ENCOUNTER — Telehealth: Payer: Self-pay

## 2022-11-10 ENCOUNTER — Telehealth: Payer: Self-pay | Admitting: Family Medicine

## 2022-11-10 NOTE — Telephone Encounter (Signed)
Ok to provide verbal ok for home health orders and I will sign any needed forms

## 2022-11-10 NOTE — Transitions of Care (Post Inpatient/ED Visit) (Signed)
11/10/2022  Name: [name] MRN: [id number] DOB: 06/06/1940  Today's TOC FU Call Status: Today's TOC FU Call Status:: Successful TOC FU Call Competed TOC FU Call Complete Date: 11/10/22  Transition Care Management Follow-up Telephone Call Date of Discharge: 11/08/22 Discharge Facility: Other ([facility] Facility) Name of Other ([facility]) Discharge Facility: [facility] Type of Discharge: Inpatient Admission Primary Inpatient Discharge Diagnosis:: "weakness,AMS" How have you been since you were released from the hospital?: Better (Spouse states pt back to baseline but remains "weaker due to being in hospital for several days." Appetite fair. LBM was Mon.-giving Miralax QD.) Any questions or concerns?: Yes Patient Questions/Concerns:: Spouse cocnerned about pleural effusion "and how will we know it is gone." Spouse also voices dificulty getting pt to MD appts-wants to see if f/u appt could be virtual or chnaged to a later time in the day Patient Questions/Concerns Addressed: Notified Provider of Patient Questions/Concerns (Spouse will discuss pleural effusion and next imaging to evaluate at appt with MD. Geradine Girt MD office-spoke w/ Erie Noe and discussed spouse's concerns/request for change in appt type or time-advised hosp f/u had to be in person and at 11 am time slot)  Items Reviewed: Did you receive and understand the discharge instructions provided?: Yes Medications obtained,verified, and reconciled?: Yes (Medications Reviewed) Any new allergies since your discharge?: No Dietary orders reviewed?: Yes Type of Diet Ordered:: low salt/heart healthy Do you have support at home?: Yes People in Home: spouse Name of Support/Comfort Primary Source: Casimiro Needle  Medications Reviewed Today: Medications Reviewed Today     Reviewed by Charlyn Minerva, RN (Registered Nurse) on 11/10/22 at 1105  Med List Status: <None>   Medication Order Taking? Sig Documenting Provider Last Dose Status  Informant  Aspirin-Caffeine (ANACIN PO) [id number] Yes Take by mouth. [provider] Taking Active   chlorhexidine (PERIDEX) 0.12 % solution [id number] Yes  [provider] Taking Active   Cholecalciferol (VITAMIN D3) 1000 units CAPS [id number] Yes Vitamin D3 1,000 unit (25 mcg) tablet   1 tablet every day by oral route. [provider] Taking Active   HYDROcodone-acetaminophen (NORCO) 10-325 MG tablet [id number] Yes Take 1 tablet by mouth daily as needed. Rx'ed by Dr. Ethelene Hal [provider] Taking Active   memantine [facility]) 10 MG tablet [id number] Yes Take 1 tablet (10 mg total) by mouth 2 (two) times daily. Glendale Chard, DO Taking Active   Multiple Vitamins-Minerals (OCUVITE ADULT FORMULA) CAPS [id number] Yes Take 1 capsule by mouth daily. [provider] Taking Active Self  pantoprazole (PROTONIX) 40 MG tablet [id number] Yes Take 1 tablet (40 mg total) by mouth daily. Sheliah Hatch, MD Taking Active   polyethylene glycol (MIRALAX / GLYCOLAX) 17 g packet [id number] Yes Take 17 g by mouth daily. [provider] Taking Active Spouse/Significant Other  sertraline (ZOLOFT) 50 MG tablet [id number] Yes Take 1 tablet (50 mg total) by mouth daily. Nita Sickle K, DO Taking Active   SUMAtriptan (IMITREX) 100 MG tablet [id number] Yes TAKE 1 TABLET BY MOUTH FOR MIGRAINE. May repeat in 2 hours if headache persists or recurs. Sheliah Hatch, MD Taking Active   SUMAtriptan (IMITREX) 100 MG tablet [id number] Yes TAKE 1 TABLET BY MOUTH FOR MIGRAINE. May repeat in 2 hours if headache persists or recurs. Sheliah Hatch, MD Taking Active   SYNTHROID 75 MCG tablet [id number] Yes Take 1 tablet (75 mcg total) by mouth daily before breakfast. Sheliah Hatch, MD Taking Active   Med List Note Maple Hudson, Rennis Chris, MD 11/01/10  2029): Allergy vaccine             Home Care and Equipment/Supplies: Were Home Health Services Ordered?: Yes Name of Home  Health Agency:: [facility] Has Agency set up a time to come to your home?: Yes First Home Health Visit Date: 11/09/22 (Spouse voices that he does not really see the need for [facility] services-just needs to see PCP." Educated him on benefit of "another medically trained eyes" on pt in the home to report any changes in pt condition to MD) Any new equipment or medical supplies ordered?: Yes Name of Medical supply agency?: [facility] Were you able to get the equipment/medical supplies?: Yes Do you have any questions related to the use of the equipment/supplies?: No  Functional Questionnaire: Do you need assistance with bathing/showering or dressing?: Yes Do you need assistance with meal preparation?: Yes Do you need assistance with eating?: Yes Do you have difficulty maintaining continence: Yes Do you need assistance with getting out of bed/getting out of a chair/moving?: Yes Do you have difficulty managing or taking your medications?: Yes  Follow up appointments reviewed: PCP Follow-up appointment confirmed?: Yes Date of PCP follow-up appointment?: 11/12/22 Follow-up Provider: Dr. Beverely Low Specialist [facility] Follow-up appointment confirmed?: NA Do you need transportation to your follow-up appointment?: No Do you understand care options if your condition(s) worsen?: Yes-patient verbalized understanding  SDOH Interventions Today    Flowsheet Row Most Recent Value  SDOH Interventions   Food Insecurity Interventions Intervention Not Indicated  Transportation Interventions Intervention Not Indicated       TOC Interventions Today    Flowsheet Row Most Recent Value  TOC Interventions   TOC Interventions Discussed/Reviewed TOC Interventions Discussed, Contacted provider for patient needs      Interventions Today    Flowsheet Row Most Recent Value  Chronic Disease   Chronic disease during today's visit Other  [dementia]  General Interventions   General Interventions Discussed/Reviewed  General Interventions Discussed, Doctor Visits  Doctor Visits Discussed/Reviewed Specialist, Doctor Visits Discussed, PCP  PCP/Specialist Visits Compliance with follow-up visit  Education Interventions   Education Provided Provided Education  Provided Verbal Education On Nutrition, When to see the doctor, Medication  Nutrition Interventions   Nutrition Discussed/Reviewed Nutrition Discussed  Pharmacy Interventions   Pharmacy Dicussed/Reviewed Pharmacy Topics Discussed, Medications and their functions  Safety Interventions   Safety Discussed/Reviewed Safety Discussed, Home Safety  Home Safety Assistive Devices        [place], [place] [facility] Care Management Care Management Community Coordinator Direct Phone: [phone] Toll Free: [phone] Fax: [phone]

## 2022-11-10 NOTE — Telephone Encounter (Signed)
[name] [phone]  Verbal Orders:  Skilled nursing: Prevention of uti education and medication compliance management  1 w 4    Also physical therapy evaluation   Please advise

## 2022-11-10 NOTE — Telephone Encounter (Signed)
Gave verbal orders to [facility]

## 2022-11-10 NOTE — Telephone Encounter (Signed)
Ok for verbal orders ?

## 2022-11-10 NOTE — Telephone Encounter (Signed)
Spoke with Tine and gave verbal orders.

## 2022-11-11 ENCOUNTER — Telehealth: Payer: Self-pay | Admitting: Family Medicine

## 2022-11-11 NOTE — Telephone Encounter (Signed)
Okay to provide verbal orders?

## 2022-11-11 NOTE — Telephone Encounter (Signed)
Home Health Verbal Orders  Agency: [facility]    Caller: Ali  Call back #: [phone]    Requesting PT:    Reason for Request:    Frequency:  2wk2, 1wk2

## 2022-11-11 NOTE — Telephone Encounter (Signed)
FYI about patients husband

## 2022-11-11 NOTE — Telephone Encounter (Signed)
Lenice Pressman and gave verbal order for the following PT   2wk2, 1wk2

## 2022-11-11 NOTE — Telephone Encounter (Signed)
Ok for verbal orders ?

## 2022-11-11 NOTE — Telephone Encounter (Signed)
[name] from [facility] is wanting Dr Beverely Low to know-pt's husband does not want hospice care at this time. In one month they revisit this idea.

## 2022-11-12 ENCOUNTER — Encounter: Payer: Self-pay | Admitting: Family Medicine

## 2022-11-12 ENCOUNTER — Ambulatory Visit (INDEPENDENT_AMBULATORY_CARE_PROVIDER_SITE_OTHER): Payer: Medicare Other | Admitting: Family Medicine

## 2022-11-12 VITALS — BP 108/62 | HR 91 | Temp 98.9°F | Resp 20 | Ht 60.0 in | Wt 120.4 lb

## 2022-11-12 DIAGNOSIS — J90 Pleural effusion, not elsewhere classified: Secondary | ICD-10-CM | POA: Diagnosis not present

## 2022-11-12 DIAGNOSIS — J189 Pneumonia, unspecified organism: Secondary | ICD-10-CM | POA: Diagnosis not present

## 2022-11-12 DIAGNOSIS — R413 Other amnesia: Secondary | ICD-10-CM | POA: Diagnosis not present

## 2022-11-12 DIAGNOSIS — I5189 Other ill-defined heart diseases: Secondary | ICD-10-CM | POA: Diagnosis not present

## 2022-11-12 MED ORDER — SYNTHROID 75 MCG PO TABS: 75.0000 ug | ORAL_TABLET | Freq: Every day | ORAL | 0 refills | Status: DC

## 2022-11-12 NOTE — Patient Instructions (Signed)
Follow up as needed or as scheduled Continue to push fluids Encourage her to take deep breaths STOP the Namenda CONTINUE the Sertraline Call with any questions or concerns Hang in there!!!

## 2022-11-12 NOTE — Progress Notes (Signed)
   Subjective:    Patient ID: [name], female    DOB: May 21, 1940, 82 y.o.   MRN: [id number]  HPI Hospital f/u- pt was admitted to [facility] on 6/21-6/24 w/ AMS, weakness, PNA w/ pleural effusion, and possible UTI.  She was d/c'd on Cefdinir 300mg  BID x1 day and Doxycycine 100mg  BID x1 day.  She had an ECHO that showed artifact vs mass (1x1 cm) on lateral wall of R atrium.  F/u with CTA/TEE/Cardiac MRI recommended.  Family decided not to evaluate as they do not plan on any interventions.  Husband reports pt is back to baseline- 'maybe better'.  No longer feeling warm or running fever.   Review of Systems For ROS see HPI     Objective:   Physical Exam Vitals reviewed.  Constitutional:      General: She is not in acute distress.    Appearance: Normal appearance. She is not ill-appearing.  HENT:     Head: Normocephalic and atraumatic.  Eyes:     Extraocular Movements: Extraocular movements intact.     Conjunctiva/sclera: Conjunctivae normal.  Cardiovascular:     Rate and Rhythm: Normal rate and regular rhythm.  Pulmonary:     Effort: Pulmonary effort is normal. No respiratory distress.     Breath sounds: No wheezing or rhonchi.  Abdominal:     General: There is no distension.     Palpations: Abdomen is soft.     Tenderness: There is no abdominal tenderness. There is no guarding or rebound.  Skin:    General: Skin is warm and dry.  Neurological:     Mental Status: She is alert. Mental status is at baseline. She is disoriented.           Assessment & Plan:  PNA- RLL.  Much improved.  Was d/c'd on Cefdinir and Doxycycline for 1 additional day.  She completed both of these.  Husband reports she is feeling much better.  R pleural effusion- new.  Likely due to her PNA.  As she is asymptomatic at this time, will hold off on repeat imaging.  Encouraged him to have her take deep breaths to expand lungs fully and prevent atelectasis.  He expressed understanding and  agreement.  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  Given her severe dementia, husband is not interested in pursing a work up.  I agree w/ this decision.

## 2022-11-19 ENCOUNTER — Telehealth: Payer: Self-pay | Admitting: Family Medicine

## 2022-11-19 NOTE — Telephone Encounter (Signed)
Home Health Verbal Orders  Agency:[facility]   Call back #:[phone] Fax #[phone]

## 2022-11-22 NOTE — Telephone Encounter (Signed)
We need to know what the verbal orders are for and duration/frequency, we cannot approve them without those details

## 2022-11-22 NOTE — Telephone Encounter (Signed)
Is it ok to give verbal orders for home health

## 2022-11-24 ENCOUNTER — Telehealth: Payer: Self-pay

## 2022-11-24 NOTE — Telephone Encounter (Signed)
Home health nurse Inetta Fermo from [facility] called and states that Mr Common is requesting we d/c the pt from the nursing part of home health not PT . He states it over stimulation for her . He states to [place] pt needs more PT not home health .  Inetta Fermo [phone]

## 2022-11-25 NOTE — Telephone Encounter (Signed)
[name] another VM to return my call

## 2022-11-25 NOTE — Telephone Encounter (Signed)
Left tina a VM to return my call

## 2022-11-25 NOTE — Telephone Encounter (Signed)
Ok to provide verbal order to d/c nursing

## 2022-11-26 ENCOUNTER — Telehealth: Payer: Self-pay | Admitting: Family Medicine

## 2022-11-26 DIAGNOSIS — J90 Pleural effusion, not elsewhere classified: Secondary | ICD-10-CM

## 2022-11-26 DIAGNOSIS — M19011 Primary osteoarthritis, right shoulder: Secondary | ICD-10-CM

## 2022-11-26 DIAGNOSIS — F05 Delirium due to known physiological condition: Secondary | ICD-10-CM | POA: Diagnosis not present

## 2022-11-26 DIAGNOSIS — I351 Nonrheumatic aortic (valve) insufficiency: Secondary | ICD-10-CM

## 2022-11-26 DIAGNOSIS — N3000 Acute cystitis without hematuria: Secondary | ICD-10-CM | POA: Diagnosis not present

## 2022-11-26 DIAGNOSIS — I517 Cardiomegaly: Secondary | ICD-10-CM

## 2022-11-26 DIAGNOSIS — K5790 Diverticulosis of intestine, part unspecified, without perforation or abscess without bleeding: Secondary | ICD-10-CM

## 2022-11-26 DIAGNOSIS — M797 Fibromyalgia: Secondary | ICD-10-CM

## 2022-11-26 DIAGNOSIS — G894 Chronic pain syndrome: Secondary | ICD-10-CM

## 2022-11-26 DIAGNOSIS — Z792 Long term (current) use of antibiotics: Secondary | ICD-10-CM

## 2022-11-26 DIAGNOSIS — Z79891 Long term (current) use of opiate analgesic: Secondary | ICD-10-CM

## 2022-11-26 DIAGNOSIS — K219 Gastro-esophageal reflux disease without esophagitis: Secondary | ICD-10-CM

## 2022-11-26 DIAGNOSIS — M419 Scoliosis, unspecified: Secondary | ICD-10-CM

## 2022-11-26 DIAGNOSIS — M5136 Other intervertebral disc degeneration, lumbar region: Secondary | ICD-10-CM

## 2022-11-26 DIAGNOSIS — I2510 Atherosclerotic heart disease of native coronary artery without angina pectoris: Secondary | ICD-10-CM

## 2022-11-26 DIAGNOSIS — J9811 Atelectasis: Secondary | ICD-10-CM

## 2022-11-26 DIAGNOSIS — M19012 Primary osteoarthritis, left shoulder: Secondary | ICD-10-CM

## 2022-11-26 DIAGNOSIS — I491 Atrial premature depolarization: Secondary | ICD-10-CM

## 2022-11-26 DIAGNOSIS — F03918 Unspecified dementia, unspecified severity, with other behavioral disturbance: Secondary | ICD-10-CM | POA: Diagnosis not present

## 2022-11-26 DIAGNOSIS — I700 Atherosclerosis of aorta: Secondary | ICD-10-CM

## 2022-11-26 DIAGNOSIS — M810 Age-related osteoporosis without current pathological fracture: Secondary | ICD-10-CM

## 2022-11-26 DIAGNOSIS — E039 Hypothyroidism, unspecified: Secondary | ICD-10-CM

## 2022-11-26 DIAGNOSIS — J181 Lobar pneumonia, unspecified organism: Secondary | ICD-10-CM | POA: Diagnosis not present

## 2022-11-26 DIAGNOSIS — Z9181 History of falling: Secondary | ICD-10-CM

## 2022-11-26 NOTE — Telephone Encounter (Signed)
Called again, no answer

## 2022-11-26 NOTE — Telephone Encounter (Signed)
Home Health Verbal Orders  Agency: [facility]   Caller: [facility]  Call back #: [phone] Fax #:    Requesting PT:    Reason for Request: Discharge Home Health PT because of Dementia DX

## 2022-11-26 NOTE — Telephone Encounter (Signed)
What are your recommendations ? Inetta Fermo had called earlier this week in regards to this but she never answered my calls nor VM

## 2022-11-26 NOTE — Telephone Encounter (Signed)
[name] returned call and I was able to provide verbal orders this is complete now

## 2022-11-29 NOTE — Telephone Encounter (Signed)
I have spoke to [name] and gave the verbal order to D/C the [facility] services . They will discuss with the pt husband who is the caregiver to see if he wants to do the hospice referral

## 2022-11-29 NOTE — Telephone Encounter (Signed)
Left Karie Mainland a VM to return my call

## 2022-11-29 NOTE — Telephone Encounter (Signed)
Left Karie Mainland another Vm to return my call

## 2022-11-29 NOTE — Telephone Encounter (Signed)
Ok to d/c [facility] services.  Given her inability to participate in PT/OT, the next step would be a palliative care/Hospice referral

## 2022-12-03 ENCOUNTER — Telehealth: Payer: Medicare Other | Admitting: Nurse Practitioner

## 2022-12-03 DIAGNOSIS — R3989 Other symptoms and signs involving the genitourinary system: Secondary | ICD-10-CM

## 2022-12-03 MED ORDER — CEPHALEXIN 500 MG PO CAPS
500.0000 mg | ORAL_CAPSULE | Freq: Two times a day (BID) | ORAL | 0 refills | Status: DC
Start: 2022-12-03 — End: 2022-12-10

## 2022-12-03 NOTE — Progress Notes (Signed)
Virtual Visit Consent   [name], you are scheduled for a virtual visit with a [facility] provider today. Just as with appointments in the office, your consent must be obtained to participate. Your consent will be active for this visit and any virtual visit you may have with one of our providers in the next 365 days. If you have a MyChart account, a copy of this consent can be sent to you electronically.  As this is a virtual visit, video technology does not allow for your provider to perform a traditional examination. This may limit your provider's ability to fully assess your condition. If your provider identifies any concerns that need to be evaluated in person or the need to arrange testing (such as labs, EKG, etc.), we will make arrangements to do so. Although advances in technology are sophisticated, we cannot ensure that it will always work on either your end or our end. If the connection with a video visit is poor, the visit may have to be switched to a telephone visit. With either a video or telephone visit, we are not always able to ensure that we have a secure connection.  By engaging in this virtual visit, you consent to the provision of healthcare and authorize for your insurance to be billed (if applicable) for the services provided during this visit. Depending on your insurance coverage, you may receive a charge related to this service.  I need to obtain your verbal consent now. Are you willing to proceed with your visit today? [name] has provided verbal consent on 12/03/2022 for a virtual visit (video or telephone). Viviano Simas, FNP  Date: 12/03/2022 6:54 PM  Virtual Visit via Video Note   I, Viviano Simas, connected with  [name]  ([id number], [birth year]) on 12/03/22 at  7:00 PM EDT by a video-enabled telemedicine application and verified that I am speaking with the correct person using two identifiers.  Location: Patient: Virtual Visit Location Patient:  Home Provider: Virtual Visit Location Provider: Home Office Husband: present and helps to provide history   I discussed the limitations of evaluation and management by telemedicine and the availability of in person appointments. The patient expressed understanding and agreed to proceed.    History of Present Illness: [name] is a 82 y.o. who identifies as a female who was assigned female at birth, and is being seen today for suspected UTI  Patient is incontinent at baseline  Has been having regular bowel movements this week   Has dementia (husband is providing history) Was at [facility] in June with pleural effusion  She has been off antibiotics for 2+ weeks now   Tonight patient is complaining of burning with urination  Denies odor to urine  This has happened in the past when dehydrated   Patient does not like to drink water  She does drink milk She will also drink coffee   Was treated in May UTI via VV in May Keflex good outcomes  Last Culture was from December   Denies fever  No new confusion/at baseline   Husband states taking her to an UC/ED is very difficult due to dementia   Problems:  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 05/12/2017   Other idiopathic scoliosis,  lumbar region 01/07/2017   Spinal stenosis of lumbar region without neurogenic claudication 10/08/2016   Chronic fatigue 03/13/2014   Chronic pain syndrome 01/10/2014   Lumbar back pain 11/06/2010   Cervical radiculopathy 06/08/2010   Osteoporosis 12/04/2008   RHINOSINUSITIS, 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/17/2008   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 06/17/2008    Headache 04/17/2008   URINARY INCONTINENCE 04/17/2008   Osteoarthritis, multiple sites 02/16/2007   Hypothyroidism 11/30/2006   GERD (gastroesophageal reflux disease) 11/30/2006   Fibromyalgia 11/30/2006    Allergies:  Allergies  Allergen Reactions   Terbinafine Hcl Hives   Prolia [Denosumab] Rash   Dorzolamide Hcl-Timolol Mal Other (See Comments)   Thimerosal (Thiomersal) Swelling    Redness   Timolol    Lamisil  [Terbinafine] Rash   Medications:  Current Outpatient Medications:    Aspirin-Caffeine (ANACIN PO), Take by mouth., Disp: , Rfl:    chlorhexidine (PERIDEX) 0.12 % solution, , Disp: , Rfl:    Cholecalciferol (VITAMIN D3) 1000 units CAPS, Vitamin D3 1,000 unit (25 mcg) tablet   1 tablet every day by oral route., Disp: , Rfl:    HYDROcodone-acetaminophen (NORCO) 10-325 MG tablet, Take 1 tablet by mouth daily as needed. Rx'ed by Dr. Ethelene Hal, Disp: , Rfl:    Multiple Vitamins-Minerals (OCUVITE ADULT FORMULA) CAPS, Take 1 capsule by mouth daily., Disp: , Rfl:    pantoprazole (PROTONIX) 40 MG tablet, Take 1 tablet (40 mg total) by mouth daily., Disp: 90 tablet, Rfl: 0   polyethylene glycol (MIRALAX / GLYCOLAX) 17 g packet, Take 17 g by mouth daily., Disp: , Rfl:    sertraline (ZOLOFT) 50 MG tablet, Take 1 tablet (50 mg total) by mouth daily., Disp: 90 tablet, Rfl: 3   SUMAtriptan (IMITREX) 100 MG tablet, TAKE 1 TABLET BY MOUTH FOR MIGRAINE. May repeat in 2 hours if headache persists or recurs., Disp: 9 tablet, Rfl: 0   SUMAtriptan (IMITREX) 100 MG tablet, TAKE 1 TABLET BY MOUTH FOR MIGRAINE. May repeat in 2 hours if headache persists or recurs., Disp: 9 tablet, Rfl: 0   SYNTHROID 75 MCG tablet, Take 1 tablet (75 mcg total) by mouth daily before breakfast., Disp: 90 tablet, Rfl: 0  Observations/Objective: Patient is well-developed, well-nourished in no acute distress.  Resting comfortably  at home.  Head is normocephalic, atraumatic.  No labored breathing.  Speech is clear and  coherent with logical content.    Assessment and Plan:  Advised home UTI testing due to difficulty with transportation Push fluids  If symptoms persist over the weekend start antibiotics as discussed  Follow up with PCP Monday    1. Suspected UTI  - cephALEXin (KEFLEX) 500 MG capsule; Take 1 capsule (500 mg total) by mouth 2 (two) times daily for 7 days.  Dispense: 14 capsule; Refill: 0    For worsening symptoms seek in person care as able   Follow Up Instructions: I discussed the assessment and treatment plan with the patient. The patient was provided an opportunity to ask questions and all were answered. The patient agreed with the plan and demonstrated an understanding of the instructions.  A copy of instructions were sent to the patient via MyChart unless otherwise noted below.    The patient was advised to call back or seek an in-person evaluation if the symptoms worsen or if the condition fails to improve as anticipated.  Time:  I spent 15 minutes with the patient via telehealth technology discussing the  above problems/concerns.    Viviano Simas, FNP

## 2022-12-06 ENCOUNTER — Encounter: Payer: Self-pay | Admitting: Family Medicine

## 2022-12-10 ENCOUNTER — Telehealth: Payer: Medicare Other | Admitting: Family Medicine

## 2022-12-10 ENCOUNTER — Encounter: Payer: Self-pay | Admitting: Family Medicine

## 2022-12-10 DIAGNOSIS — F03C0 Unspecified dementia, severe, without behavioral disturbance, psychotic disturbance, mood disturbance, and anxiety: Secondary | ICD-10-CM

## 2022-12-10 DIAGNOSIS — R1310 Dysphagia, unspecified: Secondary | ICD-10-CM | POA: Diagnosis not present

## 2022-12-10 DIAGNOSIS — R3989 Other symptoms and signs involving the genitourinary system: Secondary | ICD-10-CM

## 2022-12-10 MED ORDER — CEPHALEXIN 500 MG PO CAPS
500.0000 mg | ORAL_CAPSULE | Freq: Two times a day (BID) | ORAL | 0 refills | Status: AC
Start: 2022-12-10 — End: 2022-12-17

## 2022-12-10 NOTE — Progress Notes (Signed)
Virtual Visit via Video   I connected with patient on 12/10/22 at  9:20 AM EDT by a video enabled telemedicine application and verified that I am speaking with the correct person using two identifiers.  Location patient: Home Location provider: [place] [facility], Office Persons participating in the virtual visit: Patient, Provider, CMA (Terrah A)  I discussed the limitations of evaluation and management by telemedicine and the availability of in person appointments. The patient expressed understanding and agreed to proceed.  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  Husband had inquired about prophylactic abx.  After our MyChart conversation, he has concerns about starting daily abx.  He is bathing her daily.  Attempting to wipe for her as she has stopped wiping correctly.  He would like to have a course of abx on hand to avoid Evisits every time.  Pill dysphagia- pt is having a hard time swallowing pills.  She is trying to chew them instead.  She is unable to follow directions to understand what she is supposed to do  ROS:   See pertinent positives and negatives per HPI.  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/17/2008   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 06/17/2008    Headache 04/17/2008   URINARY INCONTINENCE 04/17/2008   Osteoarthritis, multiple sites 02/16/2007   Hypothyroidism 11/30/2006   GERD (gastroesophageal reflux disease) 11/30/2006   Fibromyalgia 11/30/2006    Social History   Tobacco Use   Smoking status: Never    Passive exposure: Never   Smokeless tobacco: Never  Substance Use Topics   Alcohol use: No    Current Outpatient Medications:    Aspirin-Caffeine (ANACIN PO), Take by mouth., Disp: , Rfl:    cephALEXin (KEFLEX) 500 MG capsule, Take 1 capsule (500 mg total) by mouth 2 (two) times daily for 7 days., Disp: 14 capsule, Rfl: 0   chlorhexidine (PERIDEX) 0.12 % solution, Use as directed in the mouth or throat once a week., Disp: , Rfl:    Cholecalciferol (VITAMIN D3) 1000 units CAPS, Vitamin D3 1,000 unit (25 mcg) tablet   1 tablet every day by oral route., Disp: , Rfl:    HYDROcodone-acetaminophen (NORCO) 10-325 MG tablet, Take 1 tablet by mouth daily as needed. Rx'ed by Dr. Ethelene Hal, Disp: , Rfl:    Multiple Vitamins-Minerals (OCUVITE ADULT FORMULA) CAPS, Take 1 capsule by mouth daily., Disp: , Rfl:    pantoprazole (PROTONIX) 40 MG tablet, Take 1 tablet (40 mg total) by mouth daily., Disp: 90 tablet, Rfl: 0   polyethylene glycol (MIRALAX / GLYCOLAX) 17 g packet, Take 17 g by mouth as needed for mild constipation or moderate constipation., Disp: , Rfl:    sertraline (ZOLOFT) 50 MG tablet, Take 1 tablet (50 mg total) by mouth daily., Disp: 90 tablet, Rfl: 3   SUMAtriptan (IMITREX) 100 MG tablet, TAKE 1 TABLET BY  MOUTH FOR MIGRAINE. May repeat in 2 hours if headache persists or recurs., Disp: 9 tablet, Rfl: 0   SYNTHROID 75 MCG tablet, Take 1 tablet (75 mcg total) by mouth daily before breakfast., Disp: 90 tablet, Rfl: 0  Allergies  Allergen Reactions   Terbinafine Hcl Hives   Prolia [Denosumab] Rash   Dorzolamide Hcl-Timolol Mal Other (See Comments)   Thimerosal (Thiomersal) Swelling    Redness   Timolol    Lamisil  [Terbinafine]  Rash    Objective:   There were no vitals taken for this visit. NAD NCAT, EOMI No obvious CN deficits Coloring WNL No obvious shortness of breath or increased work of breathing.   Assessment and Plan:   Suspected UTI- husband is confused how to proceed.  He initially thought he wanted prophylactic abx but after reviewing the literature with him, he no longer feels this is the way to go based on the risk of resistant bacteria and increased hospitalizations.  He has been using Evisits if she develops sxs after hours.  Told him this was reasonable.  He is asking if he could have a course of abx on hand to start when she develops sxs of infxn.  Told him this is a reasonable request and prescription was sent to pharmacy.  Pill dysphagia- new.  This is likely due to her dementia.  She does not understand how to take pills and is trying to chew them.  Encouraged husband to try giving pills in applesauce rather than with water.  He will let me know how this goes  Advanced Dementia- ongoing.  Again asked husband what resources he would benefit from in order to help him care for her.  He is not interested in palliative care or hospice at this time b/c having people in the house upsets her.  He doesn't feel like she needs a hospital bed.  He is not sure what- if anything- he needs at this time.  Encouraged him to reach out if he thinks of anything or something becomes apparent.  He agreed.  Applauded the care he is giving as she is excellently cared for   Neena Rhymes, MD 12/10/2022

## 2022-12-15 ENCOUNTER — Encounter: Payer: Self-pay | Admitting: Family Medicine

## 2022-12-23 NOTE — Assessment & Plan Note (Signed)
At this time dementia is severe and she's having difficulty swallowing pills.  Will stop Namenda as this is not providing any benefit.  After a discussion, we will continue Sertraline as this may be stabilizing her mood more than we realize.  Husband expressed understanding and is in agreement.

## 2023-01-05 ENCOUNTER — Encounter: Payer: Medicare Other | Admitting: Family Medicine

## 2023-01-12 ENCOUNTER — Ambulatory Visit: Payer: Medicare Other | Admitting: Podiatry

## 2023-02-04 ENCOUNTER — Telehealth: Payer: Self-pay | Admitting: Family Medicine

## 2023-02-04 NOTE — Telephone Encounter (Signed)
In the past we have advised this is okay is this still something you would agree with?

## 2023-02-04 NOTE — Telephone Encounter (Signed)
Caller name: Fiona Legg   On DPR?: Yes  Call back number: [phone] (mobile)  Provider they see: Sheliah Hatch, MD  Reason for call: Would like to know if its okay for his wife to get Flu shot and Covid shot today at [facility]

## 2023-02-07 ENCOUNTER — Encounter: Payer: Self-pay | Admitting: Family Medicine

## 2023-02-07 NOTE — Telephone Encounter (Signed)
Pt wants to know if she should continue to get COVID-19 boosters

## 2023-02-07 NOTE — Telephone Encounter (Signed)
Ok for pt to get flu and COVID shots

## 2023-02-07 NOTE — Telephone Encounter (Signed)
Pt has been notified.

## 2023-02-15 ENCOUNTER — Other Ambulatory Visit: Payer: Self-pay | Admitting: Family Medicine

## 2023-02-17 ENCOUNTER — Encounter: Payer: Self-pay | Admitting: Family Medicine

## 2023-02-17 MED ORDER — CEPHALEXIN 500 MG PO CAPS: 500.0000 mg | ORAL_CAPSULE | Freq: Two times a day (BID) | ORAL | 3 refills | Status: DC

## 2023-02-17 NOTE — Addendum Note (Signed)
Addended by: Sheliah Hatch on: 02/17/2023 07:41 AM   Modules accepted: Orders

## 2023-03-07 ENCOUNTER — Encounter: Payer: Self-pay | Admitting: Family Medicine

## 2023-03-27 ENCOUNTER — Emergency Department (HOSPITAL_COMMUNITY): Payer: Medicare Other

## 2023-03-27 ENCOUNTER — Encounter (HOSPITAL_COMMUNITY): Payer: Self-pay | Admitting: Emergency Medicine

## 2023-03-27 ENCOUNTER — Emergency Department (HOSPITAL_COMMUNITY)
Admission: EM | Admit: 2023-03-27 | Discharge: 2023-03-27 | Disposition: A | Discharge status: Home / Self Care | Payer: Medicare Other | Attending: Emergency Medicine | Admitting: Emergency Medicine

## 2023-03-27 ENCOUNTER — Other Ambulatory Visit: Payer: Self-pay

## 2023-03-27 DIAGNOSIS — W1839XA Other fall on same level, initial encounter: Secondary | ICD-10-CM | POA: Insufficient documentation

## 2023-03-27 DIAGNOSIS — F0390 Unspecified dementia without behavioral disturbance: Secondary | ICD-10-CM | POA: Diagnosis not present

## 2023-03-27 DIAGNOSIS — Y92410 Unspecified street and highway as the place of occurrence of the external cause: Secondary | ICD-10-CM | POA: Insufficient documentation

## 2023-03-27 DIAGNOSIS — M546 Pain in thoracic spine: Secondary | ICD-10-CM | POA: Diagnosis present

## 2023-03-27 DIAGNOSIS — W19XXXA Unspecified fall, initial encounter: Secondary | ICD-10-CM

## 2023-03-27 MED ORDER — LORAZEPAM 2 MG/ML IJ SOLN: 0.5000 mg | Freq: Once | INTRAMUSCULAR | Status: DC

## 2023-03-27 MED ORDER — KETOROLAC TROMETHAMINE 15 MG/ML IJ SOLN
15.0000 mg | Freq: Once | INTRAMUSCULAR | Status: AC
  Administered 2023-03-27: 15 mg via INTRAMUSCULAR
  Filled 2023-03-27: qty 1

## 2023-03-27 MED ORDER — LORAZEPAM 2 MG/ML IJ SOLN: 1.0000 mg | Freq: Once | INTRAMUSCULAR | Status: DC

## 2023-03-27 NOTE — ED Provider Notes (Signed)
[place] EMERGENCY DEPARTMENT AT [facility] Provider Note   CSN: [id number] Arrival date & time: 03/27/23  1137     History {Add pertinent medical, surgical, social history, OB history to HPI:1} Chief Complaint  Patient presents with   [name] is a 82 y.o. female past medical history significant for dementia, osteoporosis, fibromyalgia presents today after a fall in her home.  Patient's husband states he stepped out of the room, heard the patient yell out and when he came back patient was sitting on the ground on her buttocks with her torso leaning up against the bed complaining of mid back pain.  Patient's husband gave her a Norco pill for pain prior to arrival.  Patient does not currently take any blood thinners.  Patient complains of thoracic back pain.  Patient denies head pain, neck pain, chest pain, vision changes, or loss of consciousness.   Fall       Home Medications Prior to Admission medications   Medication Sig Start Date End Date Taking? Authorizing Provider  Aspirin-Caffeine (ANACIN PO) Take by mouth.    [provider]  chlorhexidine (PERIDEX) 0.12 % solution Use as directed in the mouth or throat once a week. 04/04/19   [provider]  Cholecalciferol (VITAMIN D3) 1000 units CAPS Vitamin D3 1,000 unit (25 mcg) tablet   1 tablet every day by oral route.    [provider]  HYDROcodone-acetaminophen (NORCO) 10-325 MG tablet Take 1 tablet by mouth daily as needed. Rx'ed by Dr. Ethelene Hal 02/26/17   [provider]  Multiple Vitamins-Minerals (OCUVITE ADULT FORMULA) CAPS Take 1 capsule by mouth daily.    [provider]  pantoprazole (PROTONIX) 40 MG tablet Take 1 tablet (40 mg total) by mouth daily. 10/15/22   Sheliah Hatch, MD  polyethylene glycol (MIRALAX / GLYCOLAX) 17 g packet Take 17 g by mouth as needed for mild constipation or moderate constipation.    [provider]   sertraline (ZOLOFT) 50 MG tablet Take 1 tablet (50 mg total) by mouth daily. 08/16/22   Patel, Roxana Hires K, DO  SUMAtriptan (IMITREX) 100 MG tablet TAKE 1 TABLET BY MOUTH FOR MIGRAINE. May repeat in 2 hours if headache persists or recurs. 09/15/22   Sheliah Hatch, MD  SYNTHROID 75 MCG tablet Take 1 tablet (75 mcg total) by mouth daily before breakfast. 02/16/23   Sheliah Hatch, MD      Allergies    Terbinafine hcl, Prolia [denosumab], Dorzolamide hcl-timolol mal, Thimerosal (thiomersal), Timolol, and Lamisil  [terbinafine]    Review of Systems   Review of Systems  Musculoskeletal:  Positive for back pain.    Physical Exam Updated Vital Signs BP 93/75   Pulse (!) 110   Temp 97.7 F (36.5 C) (Axillary)   Resp 16   Ht 5' (1.524 m)   Wt 54.4 kg   SpO2 99%   BMI 23.44 kg/m  Physical Exam Vitals and nursing note reviewed.  Constitutional:      General: She is not in acute distress.    Appearance: She is well-developed.  HENT:     Head: Normocephalic and atraumatic.  Eyes:     Conjunctiva/sclera: Conjunctivae normal.     Pupils: Pupils are equal, round, and reactive to light.  Cardiovascular:     Rate and Rhythm: Normal rate and regular rhythm.     Heart sounds: No murmur heard. Pulmonary:     Effort: Pulmonary effort is normal.  No respiratory distress.     Breath sounds: Normal breath sounds.  Abdominal:     Palpations: Abdomen is soft.     Tenderness: There is no abdominal tenderness.  Musculoskeletal:        General: No swelling.     Cervical back: Normal and neck supple.     Comments: Paraspinal tenderness left greater than right in the thoracic region.  Patient denies bony tenderness.  Patient has full range of motion of neck.  No step-offs or obvious deformity noted.  Skin:    General: Skin is warm and dry.     Capillary Refill: Capillary refill takes less than 2 seconds.  Neurological:     Mental Status: She is alert. Mental status is at baseline.   Psychiatric:        Mood and Affect: Mood normal.     ED Results / Procedures / Treatments   Labs (all labs ordered are listed, but only abnormal results are displayed) Labs Reviewed - No data to display  EKG None  Radiology No results found.  Procedures Procedures  {Document cardiac monitor, telemetry assessment procedure when appropriate:1}  Medications Ordered in ED Medications - No data to display  ED Course/ Medical Decision Making/ A&P   {   Click here for ABCD2, HEART and other calculatorsREFRESH Note before signing :1}                              Medical Decision Making Amount and/or Complexity of Data Reviewed Labs: ordered. Radiology: ordered.  Risk Prescription drug management.   This patient presents to the ED with chief complaint(s) of fall with pertinent past medical history of dementia and osteoporosis which further complicates the presenting complaint. The complaint involves an extensive differential diagnosis and also carries with it a high risk of complications and morbidity.    The differential diagnosis includes spinal fracture, musculoskeletal pain  Additional history obtained: Additional history obtained from spouse  ED Course and Reassessment: Patient given Toradol IM for pain   Independent visualization of imaging: - I independently visualized the following imaging with scope of interpretation limited to determining acute life threatening conditions related to emergency care: Thoracic spine x-ray, which revealed Limited examination. Suspect multiple wedge deformities of the mid to lower thoracic spine, which appear to be new compared to remote prior CT of the thoracic spine dated 02/18/2011. Recommend CT to further evaluate.  CT head, C-spine, thoracic, lumbar: Acute inferior endplate fracture of T3 with approximately 20% height loss.  Consultation: - Consulted or discussed management/test interpretation w/ external professional: Spoke  with Verlin Dike with neurosurgery who recommended placing the patient in an aspen collar and calling neurosurgery on Monday for follow-up.  Consideration for admission or further workup: Patient will schedule a follow-up appointment with neurosurgery on Monday.  Neurosurgery feels that patient is stable to be discharged and have follow-up care with them in the next upcoming weeks.  {Document critical care time when appropriate:1} {Document review of labs and clinical decision tools ie heart score, Chads2Vasc2 etc:1}  {Document your independent review of radiology images, and any outside records:1} {Document your discussion with family members, caretakers, and with consultants:1} {Document social determinants of health affecting pt's care:1} {Document your decision making why or why not admission, treatments were needed:1} Final Clinical Impression(s) / ED Diagnoses Final diagnoses:  None    Rx / DC Orders ED Discharge Orders     None

## 2023-03-27 NOTE — ED Triage Notes (Signed)
Patient arrives in wheelchair by POV with spouse. He states patient does not ambulate well, he had stepped out of the room and patient yelled out. When he came back patient was sitting on the ground on her buttocks and is c/o mid back pain. Patients husband gave pt a norco pain pill PTA. No blood thinners. Dementia at baseline.

## 2023-03-27 NOTE — Discharge Instructions (Addendum)
Today you were seen after a fall.  You may alternate taking Tylenol Motrin for pain.  Please do not take Motrin for greater than 5 days in a row as this can cause rebound headaches.  If you continue to have pain contact your pain management provider. Please call [place] [facility] on Monday to schedule a follow-up appointment.  Thank you for letting us treat you today. After forming a physical exam and reviewing your imaging, I feel you are safe to go home. Please follow up with your PCP in the next several days and provide them with your records from this visit. Return to the Emergency Room if pain becomes severe or symptoms worsen.

## 2023-04-12 ENCOUNTER — Telehealth: Payer: Self-pay

## 2023-04-12 NOTE — Telephone Encounter (Signed)
Transition Care Management Follow-up Telephone Call Date of discharge and from where: 03/27/2023 [facility] How have you been since you were released from the hospital? Patient's spouse stated that she is taking pain medication that she has before coming to the ED and is able to function. Any questions or concerns? No  Items Reviewed: Did the pt receive and understand the discharge instructions provided? Yes  Medications obtained and verified?  No medication prescribed this visit. Patient is able to afford medication. Other? No  Any new allergies since your discharge? No  Dietary orders reviewed? Yes Do you have support at home? Yes   Follow up appointments reviewed:  PCP Hospital f/u appt confirmed? No  Scheduled to see  on  @ . Specialist Hospital f/u appt confirmed?  Patient's spouse stated they have followed up with a pain management specialist.  Scheduled to see  on  @ . Are transportation arrangements needed? No  If their condition worsens, is the pt aware to call PCP or go to the Emergency Dept.? Yes Was the patient provided with contact information for the PCP's office or ED? Yes Was to pt encouraged to call back with questions or concerns? Yes   [place] [facility]  [facility] Direct Dial: [phone]  Website: [email or web address]

## 2023-04-25 ENCOUNTER — Encounter: Payer: Self-pay | Admitting: Family Medicine

## 2023-05-08 ENCOUNTER — Encounter: Payer: Self-pay | Admitting: Family Medicine

## 2023-05-09 MED ORDER — CEPHALEXIN 500 MG PO CAPS: 500.0000 mg | ORAL_CAPSULE | Freq: Two times a day (BID) | ORAL | 3 refills | Status: AC

## 2023-05-09 NOTE — Addendum Note (Signed)
Addended by: Sheliah Hatch on: 05/09/2023 04:18 PM   Modules accepted: Orders

## 2023-05-12 ENCOUNTER — Other Ambulatory Visit: Payer: Self-pay | Admitting: Family Medicine

## 2023-05-16 NOTE — Telephone Encounter (Signed)
error 

## 2023-05-23 ENCOUNTER — Other Ambulatory Visit: Payer: Self-pay | Admitting: Family Medicine

## 2023-06-08 ENCOUNTER — Ambulatory Visit: Payer: Medicare Other | Admitting: Family Medicine

## 2023-06-08 ENCOUNTER — Encounter: Payer: Self-pay | Admitting: Family Medicine

## 2023-06-08 VITALS — BP 124/72 | HR 69 | Ht 60.0 in | Wt 118.2 lb

## 2023-06-08 DIAGNOSIS — G309 Alzheimer's disease, unspecified: Secondary | ICD-10-CM

## 2023-06-08 DIAGNOSIS — R296 Repeated falls: Secondary | ICD-10-CM | POA: Diagnosis not present

## 2023-06-08 DIAGNOSIS — F0280 Dementia in other diseases classified elsewhere without behavioral disturbance: Secondary | ICD-10-CM

## 2023-06-08 DIAGNOSIS — R350 Frequency of micturition: Secondary | ICD-10-CM | POA: Diagnosis not present

## 2023-06-08 LAB — POCT URINALYSIS DIPSTICK
Bilirubin, UA: NEGATIVE
Blood, UA: NEGATIVE
Glucose, UA: NEGATIVE
Ketones, UA: NEGATIVE
Nitrite, UA: NEGATIVE
Protein, UA: POSITIVE — AB
Spec Grav, UA: 1.025 (ref 1.010–1.025)
Urobilinogen, UA: 0.2 U/dL
pH, UA: 5.5 (ref 5.0–8.0)

## 2023-06-08 MED ORDER — REXULTI 0.5 MG PO TABS: 1.0000 | ORAL_TABLET | Freq: Every day | ORAL | 3 refills | Status: DC

## 2023-06-08 NOTE — Progress Notes (Signed)
   Subjective:    Patient ID: [name], female    DOB: 1941-03-16, 83 y.o.   MRN: [id number]  HPI Alzheimer's- husband reports pt had increased confusion over the weekend.  She had increased frequency on Sunday- 'it seemed like every 20 minutes'.  He started Keflex for possible UTI.  Frequency has improved.  Confusion remains unchanged.  Sxs are worse in the evenings.  She will sometimes get down on herself about 'i can't do anything'.  Has some hallucinations over the last 6 months.  She is falling more frequently when she is wandering- won't use walker or cane.     Review of Systems For ROS see HPI     Objective:   Physical Exam Vitals reviewed.  Constitutional:      General: She is not in acute distress.    Appearance: She is not ill-appearing.  HENT:     Head: Normocephalic and atraumatic.  Eyes:     Extraocular Movements: Extraocular movements intact.  Cardiovascular:     Rate and Rhythm: Normal rate and regular rhythm.  Pulmonary:     Effort: Pulmonary effort is normal. No respiratory distress.     Breath sounds: No wheezing or rhonchi.  Abdominal:     General: There is no distension.     Palpations: Abdomen is soft.     Tenderness: There is no abdominal tenderness. There is no guarding.  Skin:    General: Skin is warm and dry.  Neurological:     Comments: Not able to answer any questions, very confused           Assessment & Plan:  Urinary frequency- pt is on day 5 of Keflex.  Husband reports frequency seems to be improving.  Will get UA and culture to ensure infection has cleared.  Frequent falls- new.  Husband reports she is refusing to use a cane or walker when she gets up to wander around.  She has fallen multiple times but has thus far avoided injury.  He has resigned himself to the fact that she will likely get injured at some point but doesn't want to ask her to stay in bed.  He doesn't feel she would listen anyway.  Given her current state- PT would not  be of any benefit as she cannot follow directions.  Alzheimer's- worsening.  Pt is now sundowning and at times becoming agitated.  Will start Rexulti in hopes of lessening agitation.  Husband in agreement.

## 2023-06-08 NOTE — Patient Instructions (Addendum)
Follow up as needed or as scheduled We'll start the Rexulti once daily (with or without food, time of day doesn't matter) for the agitation and mood Please message me after 3 weeks and let me know how things are going and whether we should increase the dose to 1mg  daily Warm compresses to her chin will help bring that ingrown hair to a head Antibiotic ointment will help prevent her picking at the area Call with any questions or concerns Hang in there!!

## 2023-06-09 ENCOUNTER — Encounter: Payer: Self-pay | Admitting: Family Medicine

## 2023-06-09 LAB — URINE CULTURE
MICRO NUMBER:: 15984188
Result:: NO GROWTH
SPECIMEN QUALITY:: ADEQUATE

## 2023-06-10 ENCOUNTER — Encounter: Payer: Self-pay | Admitting: Family Medicine

## 2023-06-10 ENCOUNTER — Telehealth: Payer: Self-pay

## 2023-06-10 NOTE — Telephone Encounter (Signed)
Copied from CRM [id number]. Topic: General - Call Back - No Documentation >> Jun 10, 2023 12:37 PM Alvino Blood C wrote: Reason for CRM: Patient is requesting a call back from [place]. C/B [phone] (M)

## 2023-06-10 NOTE — Telephone Encounter (Signed)
Patient returned Tonya's call after reading MyChart message and requested for a return call

## 2023-06-10 NOTE — Telephone Encounter (Signed)
-----   Message from Neena Rhymes sent at 06/10/2023  7:44 AM EST ----- No evidence of UTI- great news!

## 2023-06-13 NOTE — Telephone Encounter (Signed)
Mr.Flaharty was just returning phone call. Pt seen results and has no questions

## 2023-06-17 ENCOUNTER — Encounter: Payer: Self-pay | Admitting: Pharmacist

## 2023-06-17 NOTE — Progress Notes (Signed)
06/17/2023 Name: [name] MRN: [id number] DOB: 1940-12-07  Chief Complaint  Patient presents with   Medication Management    HENNESSY BARTEL is a 83 y.o. year old female. I spoke with her husband via phone today. .   They were referred to the pharmacist by their PCP for assistance in managing medication access.    Subjective: Patient was referred to clinical pharmacist due to cost of Rexulti. Mrs. Othman is noted to have Alzheimer's Disease. At her last visit with Dr  Beverely Low, Rexulti was prescribed for hallucinations at night. Patient's husband also states she is sometime a little agitated at night.   Patient reports affordability concerns with their medications: Yes  Patient reports access/transportation concerns to their pharmacy: No  - her husband provides transportation and assists with medications Patient reports adherence concerns with their medications:  No      Objective:  Lab Results  Component Value Date   HGBA1C 5.6 12/11/2015    Lab Results  Component Value Date   CREATININE 0.72 04/21/2022   BUN 23 04/21/2022   NA 139 04/21/2022   K 4.0 04/21/2022   CL 105 04/21/2022   CO2 27 04/21/2022    Lab Results  Component Value Date   CHOL 243 (H) 06/03/2021   HDL 66.60 06/03/2021   LDLCALC 158 (H) 06/03/2021   LDLDIRECT 148.7 02/12/2010   TRIG 93.0 06/03/2021   CHOLHDL 4 06/03/2021    Medications Reviewed Today     Reviewed by Henrene Pastor, RPH-CPP (Pharmacist) on 06/17/23 at 1201  Med List Status: <None>   Medication Order Taking? Sig Documenting Provider Last Dose Status Informant  Aspirin-Caffeine (ANACIN PO) [id number]  Take by mouth. [provider]  Active   Brexpiprazole (REXULTI) 0.5 MG TABS [id number] No Take 1 tablet (0.5 mg total) by mouth daily.  Patient not taking: Reported on 06/17/2023   Sheliah Hatch, MD Not Taking Active   chlorhexidine (PERIDEX) 0.12 % solution [id number]  Use as directed in the mouth or throat once a  week. [provider]  Active   HYDROcodone-acetaminophen (NORCO) 10-325 MG tablet [id number] Yes Take 1 tablet by mouth daily as needed. Rx'ed by Dr. Ethelene Hal [provider] Taking Active   polyethylene glycol (MIRALAX / GLYCOLAX) 17 g packet [id number] Yes Take 17 g by mouth as needed for mild constipation or moderate constipation. [provider] Taking Active Spouse/Significant Other  SYNTHROID 75 MCG tablet [id number] Yes Take 1 tablet (75 mcg total) by mouth daily before breakfast. Sheliah Hatch, MD Taking Active   Med List Note Maple Hudson, Rennis Chris, MD 11/01/10 2029): Allergy vaccine               Assessment/Plan:   Medication Management/ Access:  - Reviewed patient's 2025 Part D Plan with [employer]. She has a $590 deductible and then Rexulti is tier 4 so copay is 25% of medication cost which would be about $300 per month.  - There is a medication assistance program thru the manufacturer of Rexulti [employer] but Mr. Ruppe would prefer not to start Rexulti with medication assistance program because he is concerned that if for some reason in the future they were not able to continue with medication assistance program he would not want to pay for Rexulti.  - Rexulti is the only atypical antipsychotic approved to treat Alzheimer's disease hallucinations and agitation though there are others that have been used in the past. Options might be aripiprazole / Abilify, risperidone / Risperdal  or quetiapine / Seroquel. Like Rexulti, all antipsychotics have a boxed warning that it should not be used in older adults with dementia-related psychosis. Cost of generic atypical antipsychotics is about $25 / 30 days and is usually on most formularies.   Henrene Pastor, PharmD Clinical Pharmacist [facility]  Population Health [phone]

## 2023-06-20 ENCOUNTER — Encounter: Payer: Self-pay | Admitting: Family Medicine

## 2023-06-21 ENCOUNTER — Encounter: Payer: Self-pay | Admitting: Family Medicine

## 2023-06-21 ENCOUNTER — Ambulatory Visit (INDEPENDENT_AMBULATORY_CARE_PROVIDER_SITE_OTHER): Payer: Medicare Other | Admitting: Family Medicine

## 2023-06-21 ENCOUNTER — Telehealth: Payer: Self-pay

## 2023-06-21 DIAGNOSIS — R350 Frequency of micturition: Secondary | ICD-10-CM | POA: Diagnosis not present

## 2023-06-21 LAB — POCT URINALYSIS DIPSTICK
Bilirubin, UA: NEGATIVE
Blood, UA: POSITIVE
Glucose, UA: NEGATIVE
Ketones, UA: NEGATIVE
Nitrite, UA: POSITIVE
Protein, UA: POSITIVE — AB
Spec Grav, UA: >=1.03 — AB (ref 1.010–1.025)
Urobilinogen, UA: NEGATIVE U/dL — AB
pH, UA: 5.5 (ref 5.0–8.0)

## 2023-06-21 NOTE — Progress Notes (Signed)
Urine brought for UA and cx

## 2023-06-21 NOTE — Telephone Encounter (Signed)
If he has already started medication, it is ok to continue

## 2023-06-21 NOTE — Telephone Encounter (Signed)
-----   Message from Neena Rhymes sent at 06/21/2023 11:32 AM EST ----- Urine is suspicious for infection.  Would hold off on antibiotics until we have culture results

## 2023-06-22 ENCOUNTER — Telehealth: Payer: Self-pay | Admitting: Family Medicine

## 2023-06-22 NOTE — Telephone Encounter (Signed)
 Copied from CRM [id number]. Topic: Clinical - Medication Question >> Jun 22, 2023  2:22 PM [place] wrote: Reason for CRM: Patient husband calling back for [facility]. His phone was off due to being in another doctors appointment, Can call him back anytime

## 2023-06-22 NOTE — Telephone Encounter (Signed)
 Left vm to call office

## 2023-06-22 NOTE — Telephone Encounter (Signed)
 Patient husband Bambi Lever was informed will continue med

## 2023-06-22 NOTE — Telephone Encounter (Signed)
 Copied to original thread

## 2023-06-24 LAB — URINE CULTURE
MICRO NUMBER:: 16039157
SPECIMEN QUALITY:: ADEQUATE

## 2023-06-27 ENCOUNTER — Telehealth: Payer: Self-pay

## 2023-06-27 NOTE — Telephone Encounter (Signed)
-----   Message from Laymon Priest sent at 06/24/2023  7:28 AM EST ----- Her infection shows that it will respond to the Keflex  she is taking.  5 days should be plenty.

## 2023-06-27 NOTE — Telephone Encounter (Signed)
 Pt has reviewed via MyChart

## 2023-08-09 ENCOUNTER — Encounter: Payer: Self-pay | Admitting: Family Medicine

## 2023-08-09 NOTE — Telephone Encounter (Signed)
 I agree. If she already started Keflex, no need to collect a sample now. If dysuria does not resolve with keflex after 5 days, we can collect a urine sample then to look for resistant organisms.

## 2023-08-09 NOTE — Telephone Encounter (Signed)
 Patient has history of recurrent infections and was started on keflex do we need a sample or should she finish Keflex then test to see if cleared by Abx?   I know sometimes abx being onboard can skew UCX results  Please advise

## 2023-08-18 ENCOUNTER — Other Ambulatory Visit: Payer: Self-pay | Admitting: Family Medicine

## 2023-10-13 ENCOUNTER — Telehealth: Payer: Self-pay

## 2023-10-13 ENCOUNTER — Other Ambulatory Visit: Payer: Self-pay

## 2023-10-13 DIAGNOSIS — F0390 Unspecified dementia without behavioral disturbance: Secondary | ICD-10-CM

## 2023-10-13 DIAGNOSIS — F0280 Dementia in other diseases classified elsewhere without behavioral disturbance: Secondary | ICD-10-CM

## 2023-10-13 NOTE — Telephone Encounter (Signed)
 Please place new Hospice referral- dx dementia

## 2023-10-13 NOTE — Telephone Encounter (Signed)
 Received CRM stating that patient's husband/family was now ready to move forward with a hospice eval. I seen the one from last year was closed because they denied the service at that time. Would you like me to place a new order?

## 2023-10-13 NOTE — Telephone Encounter (Signed)
 Referral placed.

## 2023-10-14 ENCOUNTER — Encounter: Payer: Self-pay | Admitting: Family Medicine

## 2023-10-20 ENCOUNTER — Encounter: Payer: Self-pay | Admitting: Family Medicine

## 2023-10-20 NOTE — Telephone Encounter (Signed)
 Patient's husband is wondering if he could set up a video visit with you to discuss patient's recent enrollment to hospice and medications?

## 2023-10-24 ENCOUNTER — Ambulatory Visit: Admitting: Family Medicine

## 2024-03-17 DEATH — deceased
